# Patient Record
Sex: Female | Born: 1955
Health system: Southern US, Community
[De-identification: ages and names within clinical notes are randomized; demographics above are authoritative.]

## PROBLEM LIST (undated history)

## (undated) DIAGNOSIS — Z87440 Personal history of urinary (tract) infections: Secondary | ICD-10-CM

## (undated) DIAGNOSIS — J189 Pneumonia, unspecified organism: Secondary | ICD-10-CM

## (undated) DIAGNOSIS — H269 Unspecified cataract: Secondary | ICD-10-CM

## (undated) DIAGNOSIS — D693 Immune thrombocytopenic purpura: Secondary | ICD-10-CM

## (undated) DIAGNOSIS — L719 Rosacea, unspecified: Secondary | ICD-10-CM

## (undated) DIAGNOSIS — T7840XA Allergy, unspecified, initial encounter: Secondary | ICD-10-CM

## (undated) DIAGNOSIS — G4713 Recurrent hypersomnia: Secondary | ICD-10-CM

## (undated) DIAGNOSIS — N809 Endometriosis, unspecified: Secondary | ICD-10-CM

## (undated) HISTORY — DX: Rosacea, unspecified: L71.9

## (undated) HISTORY — PX: COLONOSCOPY: SHX174

## (undated) HISTORY — DX: Allergy, unspecified, initial encounter: T78.40XA

## (undated) HISTORY — DX: Pneumonia, unspecified organism: J18.9

## (undated) HISTORY — DX: Personal history of urinary (tract) infections: Z87.440

## (undated) HISTORY — DX: Unspecified cataract: H26.9

## (undated) HISTORY — DX: Recurrent hypersomnia: G47.13

## (undated) HISTORY — DX: Immune thrombocytopenic purpura: D69.3

## (undated) HISTORY — DX: Endometriosis, unspecified: N80.9

---
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
## 2004-06-21 ENCOUNTER — Ambulatory Visit: Payer: Self-pay

## 2004-06-25 HISTORY — PX: OTHER SURGICAL HISTORY: SHX169

## 2004-07-24 ENCOUNTER — Ambulatory Visit: Payer: Self-pay | Admitting: Urology

## 2005-03-26 ENCOUNTER — Ambulatory Visit: Payer: Self-pay | Admitting: Urology

## 2005-05-25 HISTORY — PX: OTHER SURGICAL HISTORY: SHX169

## 2005-06-09 ENCOUNTER — Ambulatory Visit: Payer: Self-pay | Admitting: Obstetrics and Gynecology

## 2005-08-04 ENCOUNTER — Ambulatory Visit: Payer: Self-pay | Admitting: Internal Medicine

## 2005-08-15 ENCOUNTER — Ambulatory Visit: Payer: Self-pay | Admitting: Internal Medicine

## 2005-09-25 HISTORY — PX: VAGINAL HYSTERECTOMY: SUR661

## 2005-09-29 ENCOUNTER — Inpatient Hospital Stay: Payer: Self-pay | Admitting: Obstetrics and Gynecology

## 2005-10-08 ENCOUNTER — Ambulatory Visit: Payer: Self-pay | Admitting: Obstetrics and Gynecology

## 2005-10-27 ENCOUNTER — Ambulatory Visit: Payer: Self-pay | Admitting: Obstetrics and Gynecology

## 2005-11-03 ENCOUNTER — Ambulatory Visit: Payer: Self-pay | Admitting: Obstetrics and Gynecology

## 2005-11-19 ENCOUNTER — Ambulatory Visit: Payer: Self-pay | Admitting: Internal Medicine

## 2005-11-19 ENCOUNTER — Ambulatory Visit: Payer: Self-pay | Admitting: Obstetrics and Gynecology

## 2006-08-12 ENCOUNTER — Ambulatory Visit: Payer: Self-pay | Admitting: General Practice

## 2006-08-25 HISTORY — PX: SHOULDER SURGERY: SHX246

## 2006-12-01 ENCOUNTER — Ambulatory Visit: Payer: Self-pay | Admitting: General Practice

## 2007-01-07 ENCOUNTER — Ambulatory Visit: Payer: Self-pay | Admitting: General Practice

## 2007-01-11 ENCOUNTER — Encounter: Payer: Self-pay | Admitting: General Practice

## 2007-01-24 ENCOUNTER — Encounter: Payer: Self-pay | Admitting: General Practice

## 2007-02-23 ENCOUNTER — Encounter: Payer: Self-pay | Admitting: General Practice

## 2007-03-26 ENCOUNTER — Encounter: Payer: Self-pay | Admitting: General Practice

## 2007-04-26 ENCOUNTER — Encounter: Payer: Self-pay | Admitting: General Practice

## 2007-05-26 ENCOUNTER — Encounter: Payer: Self-pay | Admitting: General Practice

## 2008-10-18 ENCOUNTER — Ambulatory Visit: Payer: Self-pay | Admitting: Internal Medicine

## 2008-10-24 ENCOUNTER — Ambulatory Visit: Payer: Self-pay | Admitting: Internal Medicine

## 2008-12-01 ENCOUNTER — Ambulatory Visit: Payer: Self-pay | Admitting: Internal Medicine

## 2009-01-23 ENCOUNTER — Other Ambulatory Visit: Payer: Self-pay | Admitting: Internal Medicine

## 2009-01-29 ENCOUNTER — Ambulatory Visit: Payer: Self-pay | Admitting: Internal Medicine

## 2009-03-25 ENCOUNTER — Ambulatory Visit: Payer: Self-pay | Admitting: Oncology

## 2009-04-02 ENCOUNTER — Other Ambulatory Visit: Payer: Self-pay | Admitting: Internal Medicine

## 2009-04-05 ENCOUNTER — Ambulatory Visit: Payer: Self-pay | Admitting: Gastroenterology

## 2009-04-11 ENCOUNTER — Ambulatory Visit: Payer: Self-pay | Admitting: Internal Medicine

## 2009-04-20 ENCOUNTER — Ambulatory Visit: Payer: Self-pay | Admitting: General Practice

## 2009-04-25 ENCOUNTER — Ambulatory Visit: Payer: Self-pay | Admitting: Oncology

## 2009-04-25 ENCOUNTER — Ambulatory Visit: Payer: Self-pay | Admitting: Internal Medicine

## 2009-07-11 ENCOUNTER — Ambulatory Visit: Payer: Self-pay | Admitting: Oncology

## 2009-08-03 ENCOUNTER — Ambulatory Visit: Payer: Self-pay | Admitting: Oncology

## 2009-08-25 ENCOUNTER — Ambulatory Visit: Payer: Self-pay | Admitting: Oncology

## 2009-10-23 ENCOUNTER — Ambulatory Visit: Payer: Self-pay | Admitting: Oncology

## 2009-11-14 ENCOUNTER — Ambulatory Visit: Payer: Self-pay | Admitting: Oncology

## 2009-11-23 ENCOUNTER — Ambulatory Visit: Payer: Self-pay | Admitting: Oncology

## 2010-02-19 ENCOUNTER — Other Ambulatory Visit: Payer: Self-pay | Admitting: Internal Medicine

## 2010-03-06 ENCOUNTER — Other Ambulatory Visit: Payer: Self-pay | Admitting: Internal Medicine

## 2010-04-25 ENCOUNTER — Ambulatory Visit: Payer: Self-pay | Admitting: Orthopedic Surgery

## 2010-05-13 ENCOUNTER — Ambulatory Visit: Payer: Self-pay | Admitting: Oncology

## 2010-05-24 ENCOUNTER — Ambulatory Visit: Payer: Self-pay | Admitting: Orthopedic Surgery

## 2010-05-25 ENCOUNTER — Ambulatory Visit: Payer: Self-pay | Admitting: Oncology

## 2010-11-08 ENCOUNTER — Other Ambulatory Visit: Payer: Self-pay | Admitting: Internal Medicine

## 2011-02-25 LAB — HM MAMMOGRAPHY: HM Mammogram: NEGATIVE

## 2011-05-20 ENCOUNTER — Other Ambulatory Visit: Payer: Self-pay | Admitting: Emergency Medicine

## 2012-03-17 IMAGING — CR DG KNEE COMPLETE 4+V*L*
1 series · 5 of 5 positions shown · non-contrast
Comparison: none

REASON FOR EXAM: rule out fracture- inculde sunrise views
COMMENTS:

PROCEDURE:     DXR - DXR KNEE LT COMP WITH OBLIQUES  - April 25, 2010 [DATE]
RESULT:     No fracture, dislocation or other acute bony abnormality is
identified. The knee joint space is well maintained. The patella as
visualized in the lateral and sunrise views shows no significant
abnormalities.

[Series 1: view not recorded · 0.17mm/px · 5 of 5 slices shown]
[im 1/5]
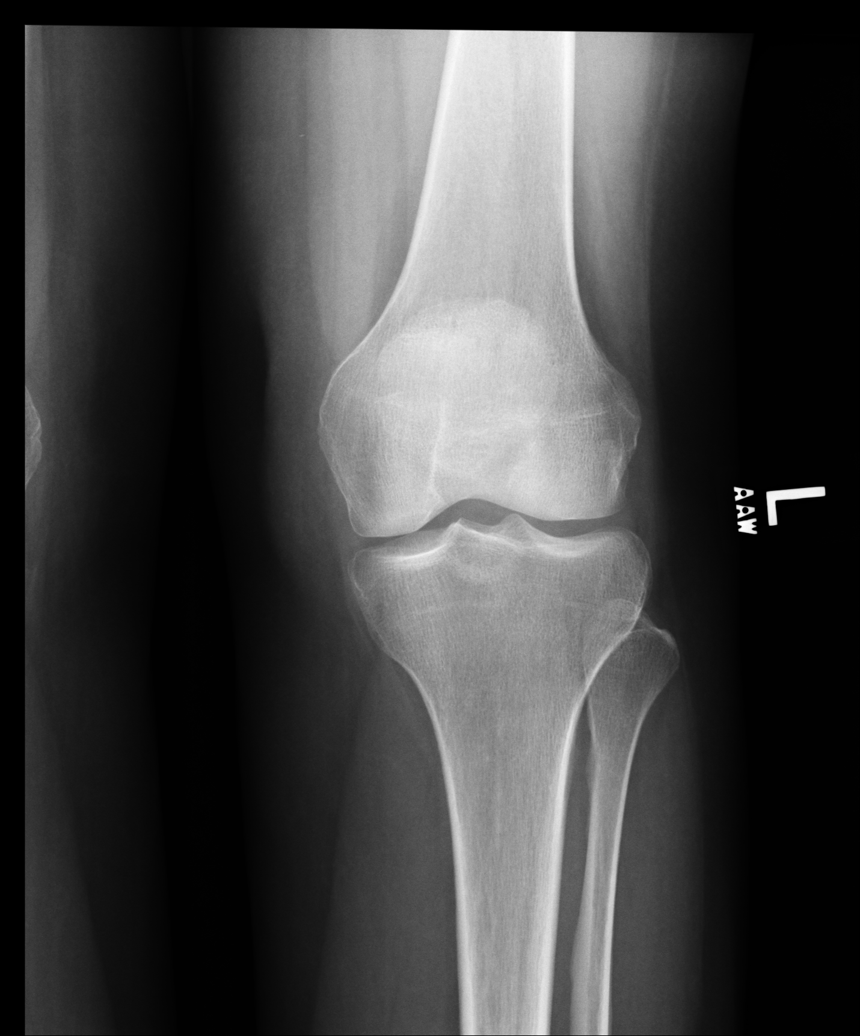
[im 2/5]
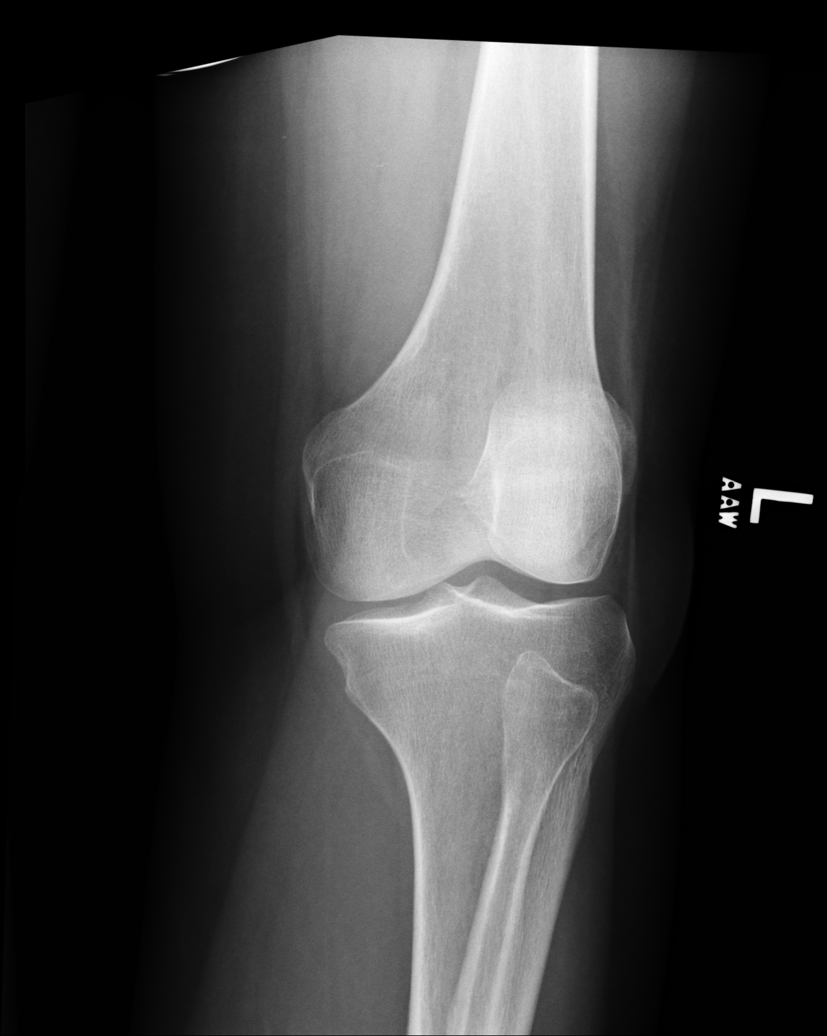
[im 3/5]
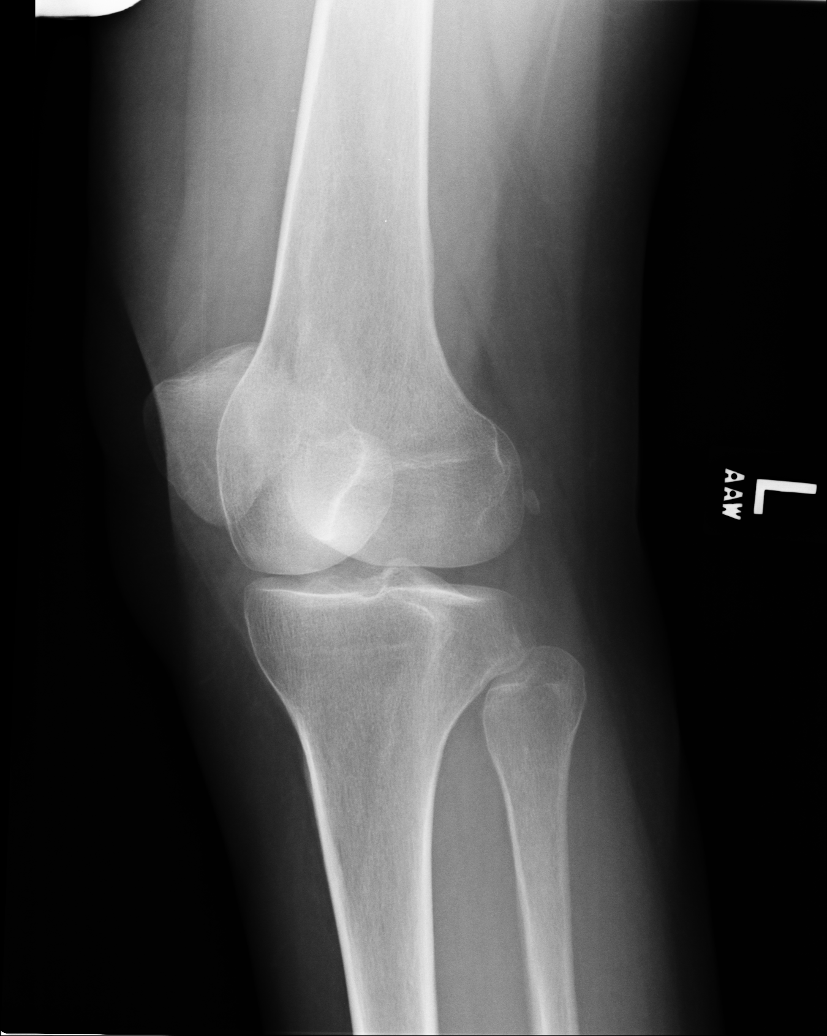
[im 4/5]
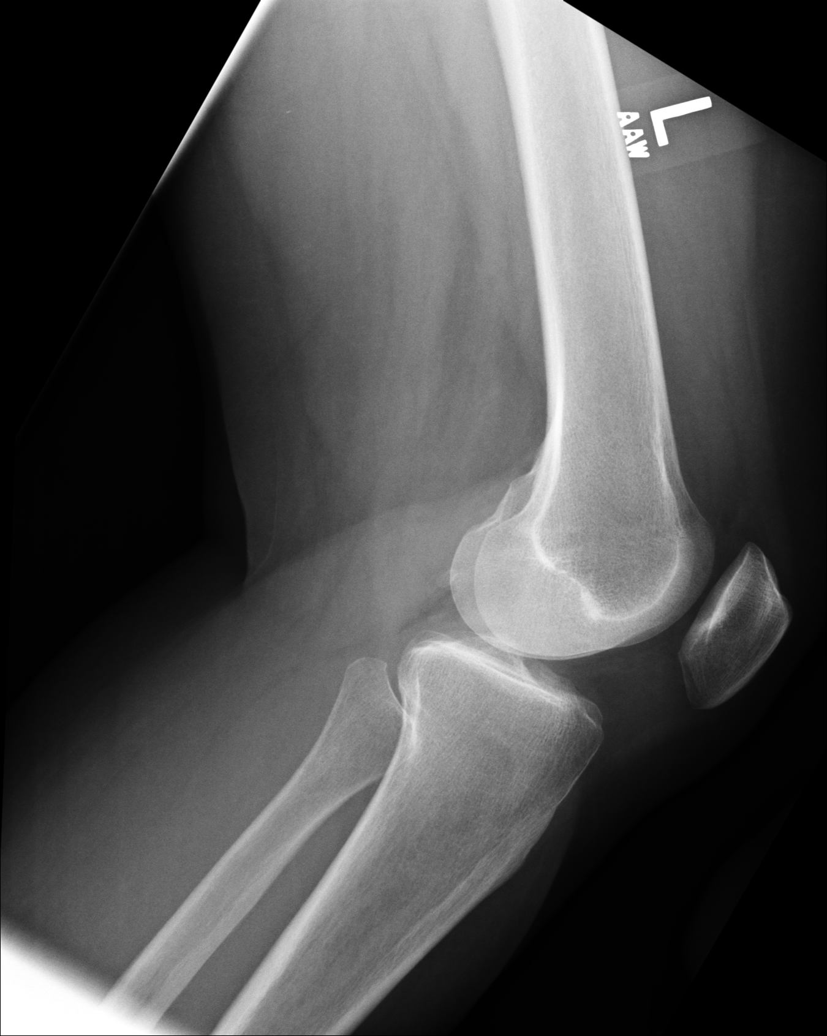
[im 5/5]
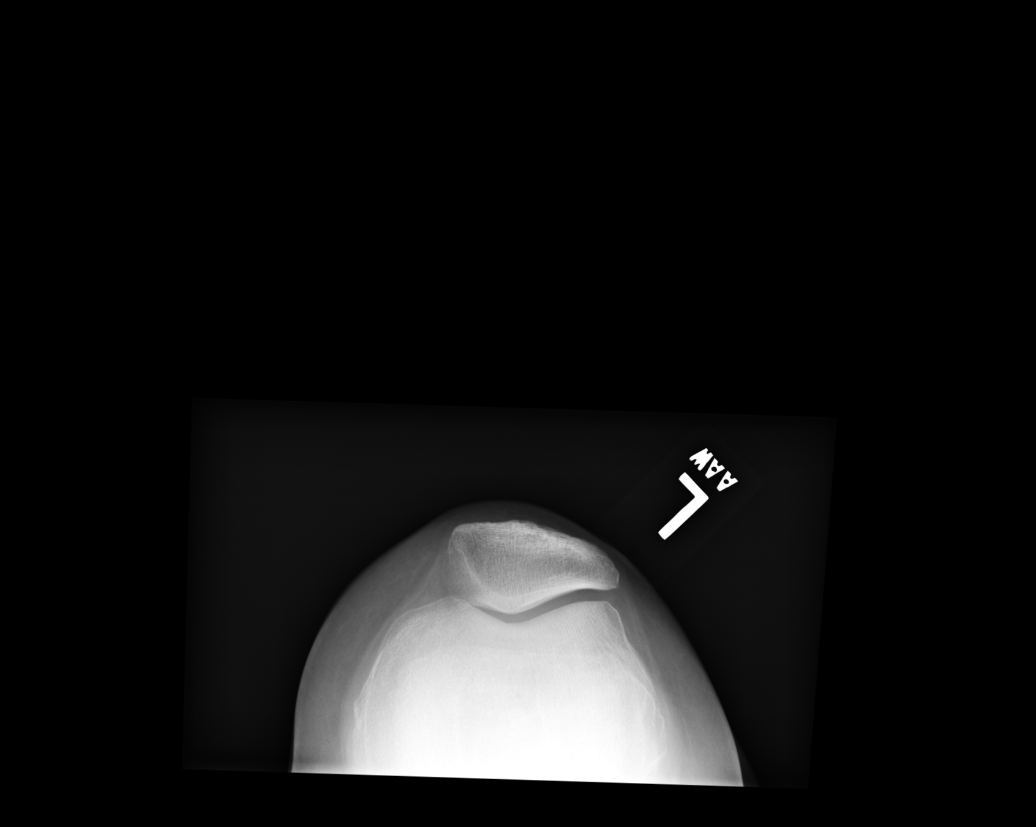

[5 of 5 positions shown; findings below may reference images not displayed]

IMPRESSION: No acute changes are identified.

## 2012-04-22 ENCOUNTER — Ambulatory Visit: Payer: Self-pay | Admitting: Physician Assistant

## 2013-04-20 ENCOUNTER — Ambulatory Visit: Payer: Self-pay | Admitting: Family Medicine

## 2013-07-05 ENCOUNTER — Encounter: Payer: Self-pay | Admitting: *Deleted

## 2013-07-06 ENCOUNTER — Ambulatory Visit (INDEPENDENT_AMBULATORY_CARE_PROVIDER_SITE_OTHER): Payer: Self-pay | Admitting: *Deleted

## 2013-07-06 DIAGNOSIS — I781 Nevus, non-neoplastic: Secondary | ICD-10-CM

## 2013-07-06 NOTE — Progress Notes (Signed)
X=.3% Sotradecol administered with a 27g butterfly.  Patient received a total of 4cc.  Pt was concerned over some very small spider veins that have been treated previously at another practice. Was able to inject them. If she needs further treatment, cutaneous laser would be a good option. Will follow prn. Tol well  Photos: yes  Compression stockings applied: yes

## 2013-07-07 ENCOUNTER — Encounter: Payer: Self-pay | Admitting: Vascular Surgery

## 2013-09-19 ENCOUNTER — Ambulatory Visit (INDEPENDENT_AMBULATORY_CARE_PROVIDER_SITE_OTHER): Payer: 59 | Admitting: Internal Medicine

## 2013-09-19 ENCOUNTER — Encounter: Payer: Self-pay | Admitting: Internal Medicine

## 2013-09-19 VITALS — BP 110/70 | HR 59 | Temp 98.0°F | Ht 66.5 in | Wt 144.2 lb

## 2013-09-19 DIAGNOSIS — N951 Menopausal and female climacteric states: Secondary | ICD-10-CM

## 2013-09-19 DIAGNOSIS — D696 Thrombocytopenia, unspecified: Secondary | ICD-10-CM

## 2013-09-19 DIAGNOSIS — Z1211 Encounter for screening for malignant neoplasm of colon: Secondary | ICD-10-CM

## 2013-09-19 DIAGNOSIS — G4700 Insomnia, unspecified: Secondary | ICD-10-CM

## 2013-09-19 DIAGNOSIS — D649 Anemia, unspecified: Secondary | ICD-10-CM

## 2013-09-19 DIAGNOSIS — M2550 Pain in unspecified joint: Secondary | ICD-10-CM

## 2013-09-19 DIAGNOSIS — Z13220 Encounter for screening for lipoid disorders: Secondary | ICD-10-CM

## 2013-09-19 MED ORDER — TRAZODONE HCL 50 MG PO TABS: 50.0000 mg | ORAL_TABLET | Freq: Every day | ORAL | Status: DC

## 2013-09-19 NOTE — Progress Notes (Signed)
Pre-visit discussion using our clinic review tool. No additional management support is needed unless otherwise documented below in the visit note.  

## 2013-09-20 ENCOUNTER — Encounter: Payer: Self-pay | Admitting: Internal Medicine

## 2013-09-20 DIAGNOSIS — D696 Thrombocytopenia, unspecified: Secondary | ICD-10-CM | POA: Insufficient documentation

## 2013-09-20 DIAGNOSIS — N951 Menopausal and female climacteric states: Secondary | ICD-10-CM | POA: Insufficient documentation

## 2013-09-20 DIAGNOSIS — D649 Anemia, unspecified: Secondary | ICD-10-CM | POA: Insufficient documentation

## 2013-09-20 DIAGNOSIS — M2550 Pain in unspecified joint: Secondary | ICD-10-CM | POA: Insufficient documentation

## 2013-09-20 DIAGNOSIS — G4700 Insomnia, unspecified: Secondary | ICD-10-CM | POA: Insufficient documentation

## 2013-09-20 NOTE — Assessment & Plan Note (Signed)
Previous anemia.  Recheck cbc.

## 2013-09-20 NOTE — Assessment & Plan Note (Signed)
Joint stiffness as outlined.  Involves her hands.   Mostly bothers her in the am.  Check ESR, ANA, RF and CRP.  Further w/up pending results.

## 2013-09-20 NOTE — Assessment & Plan Note (Signed)
Sleeping better on trazodone.  Taper the celexa as outlined.  Hopefully can taper off.

## 2013-09-20 NOTE — Assessment & Plan Note (Addendum)
Felt to have ITP.  Overdue f/u cbc.  Check cbc.  Has not followed up with hematology.

## 2013-09-20 NOTE — Progress Notes (Signed)
   Subjective:    Patient ID: Kimberly Figueroa, female    DOB: 03/29/1956, 58 y.o.   MRN: [id number]  HPI 58 year old female with past history of endometriosis s/p hysterectomy and thrombocytopenia (felt to be ITP).  She comes in today to follow up on these issues as well as for a complete physical exam.  Also here to transfer her care to [facility].  States she is doing well.  Job is going well.  Staying active.  No cardiac symptoms with increased activity or exertion.  No sob.  No cough or congestion.  No nausea or vomiting.  No bowel change.  Overall she feels good.  Sleeping better with trazodone.  On citalopram.  This has helped her hot flashes.  Felt the effexor made her too fatigued.  Tolerating the celexa better.     Past Medical History  Diagnosis Date  . Endometriosis   . History of frequent urinary tract infections     s/p bladder dilatation (11/05)  . ITP (idiopathic thrombocytopenic purpura)     Outpatient Encounter Prescriptions as of 09/19/2013  Medication Sig  . citalopram (CELEXA) 10 MG tablet Take 20 mg by mouth daily.  . Multiple Vitamins-Minerals (MULTIVITAMIN GUMMIES ADULTS PO) Take by mouth. VitaCraves  . traZODone (DESYREL) 50 MG tablet Take 1 tablet (50 mg total) by mouth at bedtime.  . [DISCONTINUED] traZODone (DESYREL) 50 MG tablet Take 50 mg by mouth at bedtime.    Review of Systems Patient denies any headache, lightheadedness or dizziness. No sinus or allergy symptoms.   No chest pain, tightness or palpatations.  No increased shortness of breath, cough or congestion.  No nausea or vomiting.  No acid reflux.  No abdominal pain or cramping.  No bowel change, such as diarrhea, constipation, BRBPR or melana.  No urine change.  hot flashes controlled.  Sleeping better.  Reports joint pain in her hands.  Bothers her in the am.  Better with movement.       Objective:   Physical Exam  Filed Vitals:   09/19/13 1537  BP: 110/70  Pulse: 59  Temp: 98 F (30.5 C)    58 year old female in no acute distress.   HEENT:  Nares- clear.  Oropharynx - without lesions. NECK:  Supple.  Nontender.  No audible bruit.  HEART:  Appears to be regular. LUNGS:  No crackles or wheezing audible.  Respirations even and unlabored.  RADIAL PULSE:  Equal bilaterally.    BREASTS:  No nipple discharge or nipple retraction present.  Could not appreciate any distinct nodules or axillary adenopathy.  ABDOMEN:  Soft, nontender.  Bowel sounds present and normal.  No audible abdominal bruit.  GU:  Not performed.  S/p hysterectomy.    EXTREMITIES:  No increased edema present.  DP pulses palpable and equal bilaterally.          Assessment & Plan:  HEALTH MAINTENANCE.  Physical today.  Schedule mammogram.  Overdue.  Colonoscopy 04/05/09 revealed tortuous colon otherwise normal.  Recommended f/u colonoscopy in 10 years (per pt).  IFOB given.   I spent 25 minutes with the patient and more than 50% of the time was spent in consultation regarding the above.

## 2013-09-20 NOTE — Assessment & Plan Note (Signed)
Doing well on celexa.  Hot flashes controlled.  Discussed with her today regarding the possible interaction with celexa and trazodone.  Will try to taper down the celexa and taper off.  Follow.

## 2013-09-26 ENCOUNTER — Other Ambulatory Visit (INDEPENDENT_AMBULATORY_CARE_PROVIDER_SITE_OTHER): Payer: 59

## 2013-09-26 DIAGNOSIS — Z13220 Encounter for screening for lipoid disorders: Secondary | ICD-10-CM

## 2013-09-26 DIAGNOSIS — D696 Thrombocytopenia, unspecified: Secondary | ICD-10-CM

## 2013-09-26 DIAGNOSIS — M2550 Pain in unspecified joint: Secondary | ICD-10-CM

## 2013-09-26 LAB — COMPREHENSIVE METABOLIC PANEL
ALT: 17 U/L (ref 0–35)
AST: 22 U/L (ref 0–37)
Albumin: 4 g/dL (ref 3.5–5.2)
Alkaline Phosphatase: 74 U/L (ref 39–117)
BILIRUBIN TOTAL: 0.7 mg/dL (ref 0.3–1.2)
BUN: 14 mg/dL (ref 6–23)
CO2: 30 mEq/L (ref 19–32)
CREATININE: 0.6 mg/dL (ref 0.4–1.2)
Calcium: 9 mg/dL (ref 8.4–10.5)
Chloride: 106 mEq/L (ref 96–112)
GFR: 102.18 mL/min (ref >60.00)
GLUCOSE: 90 mg/dL (ref 70–99)
Potassium: 4.1 mEq/L (ref 3.5–5.1)
Sodium: 142 mEq/L (ref 135–145)
Total Protein: 6.3 g/dL (ref 6.0–8.3)

## 2013-09-26 LAB — CBC WITH DIFFERENTIAL/PLATELET
Basophils Absolute: 0 10*3/uL (ref 0.0–0.1)
Basophils Relative: 0.8 % (ref 0.0–3.0)
EOS ABS: 0.1 10*3/uL (ref 0.0–0.7)
Eosinophils Relative: 2.9 % (ref 0.0–5.0)
HEMATOCRIT: 39.5 % (ref 36.0–46.0)
HEMOGLOBIN: 12.7 g/dL (ref 12.0–15.0)
Lymphocytes Relative: 31.7 % (ref 12.0–46.0)
Lymphs Abs: 1.2 10*3/uL (ref 0.7–4.0)
MCHC: 32 g/dL (ref 30.0–36.0)
MCV: 94.8 fl (ref 78.0–100.0)
MONO ABS: 0.2 10*3/uL (ref 0.1–1.0)
Monocytes Relative: 6.7 % (ref 3.0–12.0)
NEUTROS ABS: 2.1 10*3/uL (ref 1.4–7.7)
Neutrophils Relative %: 57.9 % (ref 43.0–77.0)
Platelets: 139 10*3/uL — ABNORMAL LOW (ref 150.0–400.0)
RBC: 4.17 Mil/uL (ref 3.87–5.11)
RDW: 14.5 % (ref 11.5–14.6)
WBC: 3.7 10*3/uL — AB (ref 4.5–10.5)

## 2013-09-26 LAB — LIPID PANEL
CHOLESTEROL: 160 mg/dL (ref 0–200)
HDL: 68.5 mg/dL (ref >39.00)
LDL Cholesterol: 85 mg/dL (ref 0–99)
TRIGLYCERIDES: 35 mg/dL (ref 0.0–149.0)
Total CHOL/HDL Ratio: 2
VLDL: 7 mg/dL (ref 0.0–40.0)

## 2013-09-26 LAB — C-REACTIVE PROTEIN

## 2013-09-26 LAB — TSH: TSH: 1.78 u[IU]/mL (ref 0.35–5.50)

## 2013-09-26 LAB — RHEUMATOID FACTOR

## 2013-09-26 LAB — SEDIMENTATION RATE: SED RATE: 11 mm/h (ref 0–22)

## 2013-09-27 LAB — ANA: ANA: NEGATIVE

## 2013-09-28 ENCOUNTER — Other Ambulatory Visit: Payer: Self-pay | Admitting: Internal Medicine

## 2013-09-28 DIAGNOSIS — D693 Immune thrombocytopenic purpura: Secondary | ICD-10-CM

## 2013-09-28 NOTE — Progress Notes (Signed)
Referral to hematology made

## 2013-09-29 ENCOUNTER — Ambulatory Visit: Payer: Self-pay | Admitting: Oncology

## 2013-09-29 ENCOUNTER — Telehealth: Payer: Self-pay | Admitting: Internal Medicine

## 2013-09-29 NOTE — Telephone Encounter (Signed)
If she was on one every other day and increased to two per day - see if she can decrease to two alternating with one qod.  This will not be as big of a decrease.

## 2013-09-29 NOTE — Telephone Encounter (Signed)
Closed in error-see below 

## 2013-09-29 NOTE — Telephone Encounter (Signed)
The patient wanted to inform the physician that she has gone back to taking 2 citalopram (CELEXA) 10 MG tablet every day instead of 1 every other day for her hot flashes.

## 2013-09-30 ENCOUNTER — Encounter: Payer: Self-pay | Admitting: Internal Medicine

## 2013-09-30 ENCOUNTER — Encounter: Payer: Self-pay | Admitting: *Deleted

## 2013-09-30 ENCOUNTER — Other Ambulatory Visit (INDEPENDENT_AMBULATORY_CARE_PROVIDER_SITE_OTHER): Payer: 59

## 2013-09-30 DIAGNOSIS — R195 Other fecal abnormalities: Secondary | ICD-10-CM

## 2013-09-30 DIAGNOSIS — Z1211 Encounter for screening for malignant neoplasm of colon: Secondary | ICD-10-CM

## 2013-09-30 LAB — FECAL OCCULT BLOOD, IMMUNOCHEMICAL: FECAL OCCULT BLD: POSITIVE — AB

## 2013-09-30 NOTE — Telephone Encounter (Signed)
Sent mychart message

## 2013-10-04 ENCOUNTER — Encounter: Payer: Self-pay | Admitting: Emergency Medicine

## 2013-10-04 ENCOUNTER — Encounter: Payer: Self-pay | Admitting: *Deleted

## 2013-10-04 NOTE — Telephone Encounter (Signed)
Order placed for GI referral.   

## 2013-10-07 ENCOUNTER — Encounter: Payer: Self-pay | Admitting: Internal Medicine

## 2013-10-11 ENCOUNTER — Ambulatory Visit: Payer: Self-pay | Admitting: General Practice

## 2013-10-11 LAB — FOLATE: Folic Acid: 73.5 ng/mL (ref 3.1–100.0)

## 2013-10-13 ENCOUNTER — Encounter: Payer: Self-pay | Admitting: Emergency Medicine

## 2013-10-13 ENCOUNTER — Telehealth: Payer: Self-pay | Admitting: Internal Medicine

## 2013-10-13 NOTE — Telephone Encounter (Signed)
Pt notified we received GI records.

## 2013-10-17 ENCOUNTER — Encounter: Payer: Self-pay | Admitting: Internal Medicine

## 2013-10-17 LAB — CBC CANCER CENTER
BASOS PCT: 1 %
Basophil #: 0 x10 3/mm (ref 0.0–0.1)
EOS PCT: 2.8 %
Eosinophil #: 0.1 x10 3/mm (ref 0.0–0.7)
HCT: 38.3 % (ref 35.0–47.0)
HGB: 12.1 g/dL (ref 12.0–16.0)
LYMPHS ABS: 1.5 x10 3/mm (ref 1.0–3.6)
Lymphocyte %: 33.2 %
MCH: 29.4 pg (ref 26.0–34.0)
MCHC: 31.6 g/dL — ABNORMAL LOW (ref 32.0–36.0)
MCV: 93 fL (ref 80–100)
MONO ABS: 0.2 x10 3/mm (ref 0.2–0.9)
MONOS PCT: 5.4 %
Neutrophil #: 2.6 x10 3/mm (ref 1.4–6.5)
Neutrophil %: 57.6 %
PLATELETS: 135 x10 3/mm — AB (ref 150–440)
RBC: 4.12 10*6/uL (ref 3.80–5.20)
RDW: 13.9 % (ref 11.5–14.5)
WBC: 4.5 x10 3/mm (ref 3.6–11.0)

## 2013-10-17 LAB — LACTATE DEHYDROGENASE: LDH: 205 U/L (ref 81–246)

## 2013-10-23 ENCOUNTER — Ambulatory Visit: Payer: Self-pay | Admitting: Oncology

## 2013-10-24 ENCOUNTER — Encounter: Payer: Self-pay | Admitting: *Deleted

## 2013-12-13 ENCOUNTER — Encounter: Payer: Self-pay | Admitting: Internal Medicine

## 2013-12-13 ENCOUNTER — Ambulatory Visit (INDEPENDENT_AMBULATORY_CARE_PROVIDER_SITE_OTHER): Payer: 59 | Admitting: Internal Medicine

## 2013-12-13 VITALS — BP 92/60 | HR 60 | Ht 66.5 in | Wt 145.8 lb

## 2013-12-13 DIAGNOSIS — R195 Other fecal abnormalities: Secondary | ICD-10-CM

## 2013-12-13 MED ORDER — NA SULFATE-K SULFATE-MG SULF 17.5-3.13-1.6 GM/177ML PO SOLN: ORAL | Status: DC

## 2013-12-13 NOTE — Patient Instructions (Addendum)
You have been scheduled for a colonoscopy with propofol. Please follow written instructions given to you at your visit today.  Please pick up your prep kit at the pharmacy within the next 1-3 days. If you use inhalers (even only as needed), please bring them with you on the day of your procedure. Your physician has requested that you go to www.startemmi.com and enter the access code given to you at your visit today. This web site gives a general overview about your procedure. However, you should still follow specific instructions given to you by our office regarding your preparation for the procedure.  CC: Dr Einar Pheasant

## 2013-12-13 NOTE — Progress Notes (Signed)
Kimberly Figueroa 1956-07-08 [id number]  Note: This dictation was prepared with Dragon digital system. Any transcriptional errors that result from this procedure are unintentional.   History of Present Illness:  58 year old white female who is asymptomatic as far as GI symptoms are concerned. She was found to be Hemoccult-positive on home test. She had a screening colonoscopy at age 14 in [place] which showed tortuous sigmoid colon but no polyps. She has a strong family history of Crohn's disease in her father and paternal grandfather as well as the great uncle and great amount. She denies having any diarrhea but has occasional rectal bleeding which she attributes to hemorrhoids. She has ITP, platelet count over 100,000   Past Medical History  Diagnosis Date  . Endometriosis   . History of frequent urinary tract infections     s/p bladder dilatation (11/05)  . ITP (idiopathic thrombocytopenic purpura)   . Pneumonia   . Anemia     Past Surgical History  Procedure Laterality Date  . Tonsillectomy  1961  . Cesarean section  1993  . Vaginal delivery  1987  . Bladder dilatation  11/05  . Laproscopic surgery  10/06    found to have endometriosis  . Vaginal hysterectomy  02/07  . Shoulder surgery Left 2008    Allergies  Allergen Reactions  . Darvon [Propoxyphene] Nausea Only  . Vicodin [Hydrocodone-Acetaminophen] Itching    Family history and social history have been reviewed.  Review of Systems: Denies heartburn dysphagia abdominal pain. Having normal bowel habits  The remainder of the 10 point ROS is negative except as outlined in the H&P  Physical Exam: General Appearance Well developed, in no distress Eyes  Non icteric  HEENT  Non traumatic, normocephalic  Mouth No lesion, tongue papillated, no cheilosis Neck Supple without adenopathy, thyroid not enlarged, no carotid bruits, no JVD Lungs Clear to auscultation bilaterally COR Normal S1, normal S2, regular rhythm,  no murmur, quiet precordium Abdomen soft nontender with normoactive bowel sounds. No distention Rectal no external hemorrhoids. Normal rectal sphincter tone. Small amount of brown Hemoccult negative stool Extremities  No pedal edema Skin No lesions Neurological Alert and oriented x 3 Psychological Normal mood and affect  Assessment and Plan:    58 year old white female with the home stool Hemoccults test positive for blood. With a normal colonoscopy 5 years ago. I was unable to reproduce the heme positive stool on my exam today. There is a strong family history of Crohn's disease in several relatives. Her hemoglobin was 12.7 hematocrit 39.5 platelet count 139,000. We have discussed  colonoscopy and she agrees to proceed with the diagnostic colonoscopy. We have discussed the prep as well as a sedation    Lafayette Dragon 12/13/2013

## 2013-12-20 ENCOUNTER — Encounter: Payer: Self-pay | Admitting: *Deleted

## 2013-12-21 ENCOUNTER — Ambulatory Visit (INDEPENDENT_AMBULATORY_CARE_PROVIDER_SITE_OTHER): Payer: 59 | Admitting: *Deleted

## 2013-12-21 DIAGNOSIS — I781 Nevus, non-neoplastic: Secondary | ICD-10-CM

## 2013-12-21 NOTE — Progress Notes (Signed)
   Cutaneous Laser:pulsed mode  810 j/cm2 424ms delay 76ms duration 0.5 spot  Total pulses: 2387 Total energy 3.787  Total time::30  Photos: not new onew  Compression stockings applied: NA  Patient was not happy with sclero results so tried CL on the tiny vessels that bother her. Hoping for results she is happy with.

## 2013-12-22 ENCOUNTER — Encounter: Payer: Self-pay | Admitting: *Deleted

## 2013-12-22 LAB — HM MAMMOGRAPHY

## 2013-12-28 ENCOUNTER — Encounter: Payer: Self-pay | Admitting: Internal Medicine

## 2014-01-02 ENCOUNTER — Ambulatory Visit: Payer: Self-pay

## 2014-01-02 ENCOUNTER — Other Ambulatory Visit: Payer: Self-pay | Admitting: Occupational Medicine

## 2014-01-02 DIAGNOSIS — M25532 Pain in left wrist: Secondary | ICD-10-CM

## 2014-01-17 ENCOUNTER — Telehealth: Payer: Self-pay | Admitting: *Deleted

## 2014-01-17 NOTE — Telephone Encounter (Signed)
On which medication? (Please specify)

## 2014-01-17 NOTE — Telephone Encounter (Signed)
Pharmacy Note:  Pt is asking for a 90 day supply

## 2014-01-17 NOTE — Telephone Encounter (Signed)
Citalopram 10 mg

## 2014-01-18 MED ORDER — CITALOPRAM HYDROBROMIDE 10 MG PO TABS: 20.0000 mg | ORAL_TABLET | Freq: Every day | ORAL | Status: DC

## 2014-01-18 NOTE — Telephone Encounter (Signed)
90 day supply sent to pharmacy

## 2014-01-18 NOTE — Addendum Note (Signed)
Addended by: Wynonia Lawman E on: 01/18/2014 09:29 AM   Modules accepted: Orders

## 2014-02-08 ENCOUNTER — Ambulatory Visit (AMBULATORY_SURGERY_CENTER): Payer: 59 | Admitting: Internal Medicine

## 2014-02-08 ENCOUNTER — Encounter: Payer: Self-pay | Admitting: Internal Medicine

## 2014-02-08 VITALS — BP 117/73 | HR 58 | Temp 96.9°F | Resp 26 | Ht 66.0 in | Wt 145.0 lb

## 2014-02-08 DIAGNOSIS — R195 Other fecal abnormalities: Secondary | ICD-10-CM

## 2014-02-08 MED ORDER — SODIUM CHLORIDE 0.9 % IV SOLN: 500.0000 mL | INTRAVENOUS | Status: DC

## 2014-02-08 NOTE — Progress Notes (Signed)
Pt. Very sleepy through out post procedure.  Vital signs are stable.  Occasionally has awakened very briefly and gone back to sleep. 1624.

## 2014-02-08 NOTE — Progress Notes (Signed)
1637, pt. Up to bathroom with assistance. Drowsy but answers questions appropriately.  Discharged to home via wheelchair with spouse.

## 2014-02-08 NOTE — Op Note (Signed)
[facility]  [address] [place], [id number]   COLONOSCOPY PROCEDURE REPORT  PATIENT: Kimberly Figueroa, Kimberly Figueroa  MR#: [id number] BIRTHDATE: October 18, 1955 , 58  yrs. old GENDER: Female ENDOSCOPIST: Lafayette Dragon, MD REFERRED BY: Dr Einar Pheasant PROCEDURE DATE:  02/08/2014 PROCEDURE:   Colonoscopy, screening First Screening Colonoscopy - Avg.  risk and is 50 yrs.  old or older - No.  Prior Negative Screening - Now for repeat screening. 10 or more years since last screening  History of Adenoma - Now for follow-up colonoscopy & has been > or = to 3 yrs.  N/A  Polyps Removed Today? No.  Recommend repeat exam, <10 yrs? No. ASA CLASS:   Class II INDICATIONS:llast colonoscopy at age 58 in [place], positive family history of Crohn's disease in father.  Maternal grandfather. Uncle and aunt.  Heme positive stool. MEDICATIONS: MAC sedation, administered by CRNA and propofol (Diprivan) 500mg  IV  DESCRIPTION OF PROCEDURE:   After the risks benefits and alternatives of the procedure were thoroughly explained, informed consent was obtained.  A digital rectal exam revealed no abnormalities of the rectum.   The LB [id number]  endoscope was introduced through the anus and advanced to the cecum, which was identified by both the appendix and ileocecal valve. No adverse events experienced.   The quality of the prep was good, using MoviPrep  The instrument was then slowly withdrawn as the colon was fully examined.      COLON FINDINGS: A normal appearing cecum, ileocecal valve, and appendiceal orifice were identified.  The ascending, hepatic flexure, transverse, splenic flexure, descending, sigmoid colon and rectum appeared unremarkable. I was unable to enter terminal ileum No polyps or cancers were seen.  Retroflexed views revealed no abnormalities. The time to cecum=6 minutes 14 seconds.  Withdrawal time=11 minutes 16 seconds.  The scope was withdrawn and the procedure  completed. COMPLICATIONS: There were no complications.  ENDOSCOPIC IMPRESSION: Normal colon to the cecum  RECOMMENDATIONS: high fiber diet Repeat stool Hemoccults, if positive we will consider upper endoscopy and/or small bowel capsule endoscopy For recall colonoscopy in 10 years   eSigned:  Lafayette Dragon, MD 02/08/2014 3:18 PM   cc:

## 2014-02-08 NOTE — Patient Instructions (Addendum)
YOU HAD AN ENDOSCOPIC PROCEDURE TODAY AT THE [facility]: Refer to the procedure report that was given to you for any specific questions about what was found during the examination.  If the procedure report does not answer your questions, please call your gastroenterologist to clarify.  If you requested that your care partner not be given the details of your procedure findings, then the procedure report has been included in a sealed envelope for you to review at your convenience later.  YOU SHOULD EXPECT: Some feelings of bloating in the abdomen. Passage of more gas than usual.  Walking can help get rid of the air that was put into your GI tract during the procedure and reduce the bloating. If you had a lower endoscopy (such as a colonoscopy or flexible sigmoidoscopy) you may notice spotting of blood in your stool or on the toilet paper. If you underwent a bowel prep for your procedure, then you may not have a normal bowel movement for a few days.  DIET: Your first meal following the procedure should be a light meal and then it is ok to progress to your normal diet.  A half-sandwich or bowl of soup is an example of a good first meal.  Heavy or fried foods are harder to digest and may make you feel nauseous or bloated.  Likewise meals heavy in dairy and vegetables can cause extra gas to form and this can also increase the bloating.  Drink plenty of fluids but you should avoid alcoholic beverages for 24 hours.  ACTIVITY: Your care partner should take you home directly after the procedure.  You should plan to take it easy, moving slowly for the rest of the day.  You can resume normal activity the day after the procedure however you should NOT DRIVE or use heavy machinery for 24 hours (because of the sedation medicines used during the test).    SYMPTOMS TO REPORT IMMEDIATELY: A gastroenterologist can be reached at any hour.  During normal business hours, 8:30 AM to 5:00 PM Monday through Friday,  call [phone].  After hours and on weekends, please call the GI answering service at [phone] who will take a message and have the physician on call contact you.   Following lower endoscopy (colonoscopy or flexible sigmoidoscopy):  Excessive amounts of blood in the stool  Significant tenderness or worsening of abdominal pains  Swelling of the abdomen that is new, acute  Fever of 100F or higher    FOLLOW UP: If any biopsies were taken you will be contacted by phone or by letter within the next 1-3 weeks.  Call your gastroenterologist if you have not heard about the biopsies in 3 weeks.  Our staff will call the home number listed on your records the next business day following your procedure to check on you and address any questions or concerns that you may have at that time regarding the information given to you following your procedure. This is a courtesy call and so if there is no answer at the home number and we have not heard from you through the emergency physician on call, we will assume that you have returned to your regular daily activities without incident.  SIGNATURES/CONFIDENTIALITY: You and/or your care partner have signed paperwork which will be entered into your electronic medical record.  These signatures attest to the fact that that the information above on your After Visit Summary has been reviewed and is understood.  Full responsibility of the confidentiality   of this discharge information lies with you and/or your care-partner.   Normal colonoscopy!  Repeat in 10 years. 2025.  High fiber diet information given.  Stool specimen card given to pt. With return address to [facility].

## 2014-02-08 NOTE — Progress Notes (Signed)
Report to PACU, RN, vss, BBS= Clear.  

## 2014-02-09 ENCOUNTER — Other Ambulatory Visit: Payer: Self-pay | Admitting: *Deleted

## 2014-02-09 ENCOUNTER — Telehealth: Payer: Self-pay | Admitting: *Deleted

## 2014-02-09 DIAGNOSIS — R195 Other fecal abnormalities: Secondary | ICD-10-CM

## 2014-02-09 NOTE — Telephone Encounter (Signed)
  Follow up Call-  Call back number 02/08/2014  Post procedure Call Back phone  # [phone]  Permission to leave phone message Yes     Patient questions:  Do you have a fever, pain , or abdominal swelling? no Pain Score  0 *  Have you tolerated food without any problems? yes  Have you been able to return to your normal activities? yes  Do you have any questions about your discharge instructions: Diet   no Medications  no Follow up visit  no  Do you have questions or concerns about your Care? no  Actions: * If pain score is 4 or above: No action needed, pain <4.

## 2014-02-13 ENCOUNTER — Encounter: Payer: Self-pay | Admitting: Internal Medicine

## 2014-02-13 DIAGNOSIS — D638 Anemia in other chronic diseases classified elsewhere: Secondary | ICD-10-CM

## 2014-03-01 ENCOUNTER — Encounter: Payer: Self-pay | Admitting: Internal Medicine

## 2014-03-21 ENCOUNTER — Encounter: Payer: Self-pay | Admitting: Internal Medicine

## 2014-03-21 ENCOUNTER — Ambulatory Visit (INDEPENDENT_AMBULATORY_CARE_PROVIDER_SITE_OTHER): Payer: 59 | Admitting: Internal Medicine

## 2014-03-21 VITALS — BP 98/60 | HR 64 | Temp 98.9°F | Ht 66.0 in | Wt 147.8 lb

## 2014-03-21 DIAGNOSIS — G4700 Insomnia, unspecified: Secondary | ICD-10-CM

## 2014-03-21 DIAGNOSIS — D696 Thrombocytopenia, unspecified: Secondary | ICD-10-CM

## 2014-03-21 DIAGNOSIS — N951 Menopausal and female climacteric states: Secondary | ICD-10-CM

## 2014-03-21 MED ORDER — CLONAZEPAM 0.5 MG PO TABS: ORAL_TABLET | ORAL | Status: DC

## 2014-03-21 MED ORDER — CITALOPRAM HYDROBROMIDE 10 MG PO TABS: ORAL_TABLET | ORAL | Status: DC

## 2014-03-21 NOTE — Progress Notes (Signed)
Pre visit review using our clinic review tool, if applicable. No additional management support is needed unless otherwise documented below in the visit note. 

## 2014-03-26 ENCOUNTER — Encounter: Payer: Self-pay | Admitting: Internal Medicine

## 2014-03-26 NOTE — Progress Notes (Signed)
   Subjective:    Patient ID: Kimberly Figueroa, female    DOB: 31-Aug-1955, 58 y.o.   MRN: [id number]  HPI 58 year old female with past history of endometriosis s/p hysterectomy and thrombocytopenia (felt to be ITP).  She comes in today for a scheduled follow up.  States she is doing relatively well.  Job is going well.  Staying active.  No cardiac symptoms with increased activity or exertion.  No sob.  No cough or congestion.  No nausea or vomiting.  No bowel change.  Overall she feels good.  On citalopram.  This has helped her hot flashes.  She has noticed more hot flashes lately.  Wanted to increase the dose of citalopram.  She is taking trazodone.  We discussed the possible interaction between citalopram and trazodone.  Felt effexor made her too fatigued.     Past Medical History  Diagnosis Date  . Endometriosis   . History of frequent urinary tract infections     s/p bladder dilatation (11/05)  . ITP (idiopathic thrombocytopenic purpura)   . Pneumonia   . Anemia     Outpatient Encounter Prescriptions as of 03/21/2014  Medication Sig  . citalopram (CELEXA) 10 MG tablet Take 3 tablets per day  . Multiple Vitamins-Minerals (MULTIVITAMIN GUMMIES ADULTS PO) Take by mouth. VitaCraves  . [DISCONTINUED] citalopram (CELEXA) 10 MG tablet Take 2 tablets (20 mg total) by mouth daily.  . [DISCONTINUED] traZODone (DESYREL) 50 MG tablet Take 1 tablet (50 mg total) by mouth at bedtime.  . clonazePAM (KLONOPIN) 0.5 MG tablet 1/2 - 1 tablet q hs    Review of Systems Patient denies any headache, lightheadedness or dizziness. No sinus or allergy symptoms.   No chest pain, tightness or palpitations.  No increased shortness of breath, cough or congestion.  No nausea or vomiting.  No acid reflux.  No abdominal pain or cramping.  No bowel change, such as diarrhea, constipation, BRBPR or melana.  No urine change.  Increased hot flashes as outlined.       Objective:   Physical Exam   Filed Vitals:   03/21/14 1408  BP: 98/60  Pulse: 64  Temp: 98.9 F (71.71 C)   58 year old female in no acute distress.   HEENT:  Nares- clear.  Oropharynx - without lesions. NECK:  Supple.  Nontender.  No audible bruit.  HEART:  Appears to be regular. LUNGS:  No crackles or wheezing audible.  Respirations even and unlabored.  RADIAL PULSE:  Equal bilaterally.  ABDOMEN:  Soft, nontender.  Bowel sounds present and normal.  No audible abdominal bruit.   EXTREMITIES:  No increased edema present.  DP pulses palpable and equal bilaterally.          Assessment & Plan:  HEALTH MAINTENANCE.  Physical last visit.  Mammogram 12/22/13 - negative.  Colonoscopy 04/05/09 revealed tortuous colon otherwise normal.  Recommended f/u colonoscopy in 10 years (per pt).

## 2014-03-26 NOTE — Assessment & Plan Note (Addendum)
Has been doing well on citalopram.  Increased hot flashes recently.  Discussed increasing citalopram.  Will need to change trazodone (given the possible interaction between citalopram and trazodone).  Will increase citalopram to 10mg  (three per day).  Stop trazodone.  Will use clonazepam as directed to help with sleep.

## 2014-03-26 NOTE — Assessment & Plan Note (Signed)
Has been stable.  Worked up by hematology.  Check yearly counts.

## 2014-03-26 NOTE — Assessment & Plan Note (Signed)
Will stop trazodone since increasing citalopram.  Start clonazepam .5mg  1/2 - 1 tablet q hs.  Follow.

## 2014-04-06 ENCOUNTER — Encounter: Payer: Self-pay | Admitting: Internal Medicine

## 2014-04-20 NOTE — Telephone Encounter (Signed)
Unread mychart message mailed to patient 

## 2014-05-23 ENCOUNTER — Encounter: Payer: Self-pay | Admitting: Internal Medicine

## 2014-05-26 ENCOUNTER — Encounter: Payer: Self-pay | Admitting: Internal Medicine

## 2014-05-26 ENCOUNTER — Ambulatory Visit (INDEPENDENT_AMBULATORY_CARE_PROVIDER_SITE_OTHER): Payer: 59 | Admitting: Internal Medicine

## 2014-05-26 VITALS — BP 110/70 | HR 60 | Temp 98.4°F | Ht 66.0 in | Wt 144.2 lb

## 2014-05-26 DIAGNOSIS — D638 Anemia in other chronic diseases classified elsewhere: Secondary | ICD-10-CM

## 2014-05-26 DIAGNOSIS — N951 Menopausal and female climacteric states: Secondary | ICD-10-CM

## 2014-05-26 DIAGNOSIS — G4700 Insomnia, unspecified: Secondary | ICD-10-CM

## 2014-05-26 DIAGNOSIS — D696 Thrombocytopenia, unspecified: Secondary | ICD-10-CM

## 2014-05-26 DIAGNOSIS — R5383 Other fatigue: Secondary | ICD-10-CM

## 2014-05-26 LAB — CBC WITH DIFFERENTIAL/PLATELET
BASOS PCT: 0.6 % (ref 0.0–3.0)
Basophils Absolute: 0 10*3/uL (ref 0.0–0.1)
Eosinophils Absolute: 0.1 10*3/uL (ref 0.0–0.7)
Eosinophils Relative: 1.8 % (ref 0.0–5.0)
HEMATOCRIT: 38 % (ref 36.0–46.0)
HEMOGLOBIN: 12.5 g/dL (ref 12.0–15.0)
LYMPHS ABS: 1.6 10*3/uL (ref 0.7–4.0)
LYMPHS PCT: 31.1 % (ref 12.0–46.0)
MCHC: 33 g/dL (ref 30.0–36.0)
MCV: 91.2 fl (ref 78.0–100.0)
MONOS PCT: 6.6 % (ref 3.0–12.0)
Monocytes Absolute: 0.3 10*3/uL (ref 0.1–1.0)
NEUTROS ABS: 3.1 10*3/uL (ref 1.4–7.7)
Neutrophils Relative %: 59.9 % (ref 43.0–77.0)
Platelets: 146 10*3/uL — ABNORMAL LOW (ref 150.0–400.0)
RBC: 4.17 Mil/uL (ref 3.87–5.11)
RDW: 14.2 % (ref 11.5–15.5)
WBC: 5.2 10*3/uL (ref 4.0–10.5)

## 2014-05-26 LAB — COMPREHENSIVE METABOLIC PANEL
ALT: 16 U/L (ref 0–35)
AST: 23 U/L (ref 0–37)
Albumin: 4.2 g/dL (ref 3.5–5.2)
Alkaline Phosphatase: 82 U/L (ref 39–117)
BILIRUBIN TOTAL: 0.6 mg/dL (ref 0.2–1.2)
BUN: 15 mg/dL (ref 6–23)
CALCIUM: 9.2 mg/dL (ref 8.4–10.5)
CHLORIDE: 102 meq/L (ref 96–112)
CO2: 27 meq/L (ref 19–32)
CREATININE: 0.6 mg/dL (ref 0.4–1.2)
GFR: 109.81 mL/min (ref >60.00)
GLUCOSE: 86 mg/dL (ref 70–99)
Potassium: 4.1 mEq/L (ref 3.5–5.1)
Sodium: 139 mEq/L (ref 135–145)
Total Protein: 6.9 g/dL (ref 6.0–8.3)

## 2014-05-26 LAB — TSH: TSH: 1.52 u[IU]/mL (ref 0.35–4.50)

## 2014-05-26 NOTE — Progress Notes (Signed)
Pre visit review using our clinic review tool, if applicable. No additional management support is needed unless otherwise documented below in the visit note. 

## 2014-05-26 NOTE — Patient Instructions (Signed)
Taper celexa as we discussed.

## 2014-05-27 ENCOUNTER — Encounter: Payer: Self-pay | Admitting: Internal Medicine

## 2014-05-28 ENCOUNTER — Encounter: Payer: Self-pay | Admitting: Internal Medicine

## 2014-05-28 NOTE — Assessment & Plan Note (Signed)
Has been doing well on citalopram.  Will taper off the medication.  Follow.

## 2014-05-28 NOTE — Assessment & Plan Note (Signed)
Has been stable.  Worked up by hematology.  Given that her gums have been bleeding recently, will recheck today.

## 2014-05-28 NOTE — Assessment & Plan Note (Signed)
Issues with sleep as outlined.  After discussion, it appears this was an issue prior to trazodone.  Will taper off citalopram.  Restart trazodone.  Monitor for increased somnolence.  Knows not to drive if increased somnolence.  Will refer to neurology for further evaluation of her sleep issues.  She is in agreement.  Has had sleep study previously that was negative for sleep apnea.

## 2014-05-28 NOTE — Assessment & Plan Note (Signed)
Previous anemia.  Recheck cbc today.
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
## 2014-05-29 ENCOUNTER — Encounter: Payer: Self-pay | Admitting: Internal Medicine

## 2014-05-30 ENCOUNTER — Telehealth: Payer: Self-pay | Admitting: Internal Medicine

## 2014-05-30 NOTE — Telephone Encounter (Signed)
Pt dropped off records for Dr. Derrel Nip. Records in Dr. Lupita Dawn box.msn

## 2014-05-30 NOTE — Telephone Encounter (Signed)
Copy of a sleep study placed in results.

## 2014-05-30 NOTE — Telephone Encounter (Signed)
I could not find the sleep study.  Did she give this to you?   Thanks.

## 2014-05-31 NOTE — Telephone Encounter (Signed)
Forward copy to Dr Asencion Partridge Dohmeier ([facility]).  Pt to be scheduled for upcoming appt.  Wanted faxed.  (phone number [phone], backdoor number [phone].  Hold out until we know they have the copy, then needs to be scanned.  Sleep study placed back in your box.

## 2014-05-31 NOTE — Telephone Encounter (Signed)
Was placed in Dr. Lupita Dawn box & put in her folder. Paperwork found & placed in your green folder

## 2014-06-01 ENCOUNTER — Encounter: Payer: Self-pay | Admitting: Neurology

## 2014-06-01 NOTE — Telephone Encounter (Signed)
Copy faxed to [place]

## 2014-06-05 ENCOUNTER — Encounter: Payer: Self-pay | Admitting: Neurology

## 2014-06-05 ENCOUNTER — Ambulatory Visit (INDEPENDENT_AMBULATORY_CARE_PROVIDER_SITE_OTHER): Payer: 59 | Admitting: Neurology

## 2014-06-05 VITALS — BP 96/61 | HR 63 | Resp 16 | Ht 67.0 in | Wt 146.0 lb

## 2014-06-05 DIAGNOSIS — F5102 Adjustment insomnia: Secondary | ICD-10-CM

## 2014-06-05 MED ORDER — ZALEPLON 10 MG PO CAPS: 10.0000 mg | ORAL_CAPSULE | Freq: Every evening | ORAL | Status: DC | PRN

## 2014-06-05 MED ORDER — TRAZODONE HCL 50 MG PO TABS: 50.0000 mg | ORAL_TABLET | Freq: Every evening | ORAL | Status: DC | PRN
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
## 2014-06-05 NOTE — Patient Instructions (Addendum)
Melatonin. 5 mg  one hour before intended bedtime.        Insomnia Insomnia is frequent trouble falling and/or staying asleep. Insomnia can be a long term problem or a short term problem. Both are common. Insomnia can be a short term problem when the wakefulness is related to a certain stress or worry. Long term insomnia is often related to ongoing stress during waking hours and/or poor sleeping habits. Overtime, sleep deprivation itself can make the problem worse. Every little thing feels more severe because you are overtired and your ability to cope is decreased. CAUSES   Stress, anxiety, and depression.  Poor sleeping habits.  Distractions such as TV in the bedroom.  Naps close to bedtime.  Engaging in emotionally charged conversations before bed.  Technical reading before sleep.  Alcohol and other sedatives. They may make the problem worse. They can hurt normal sleep patterns and normal dream activity.  Stimulants such as caffeine for several hours prior to bedtime.  Pain syndromes and shortness of breath can cause insomnia.  Exercise late at night.  Changing time zones may cause sleeping problems (jet lag). It is sometimes helpful to have someone observe your sleeping patterns. They should look for periods of not breathing during the night (sleep apnea). They should also look to see how long those periods last. If you live alone or observers are uncertain, you can also be observed at a sleep clinic where your sleep patterns will be professionally monitored. Sleep apnea requires a checkup and treatment. Give your caregivers your medical history. Give your caregivers observations your family has made about your sleep.  SYMPTOMS   Not feeling rested in the morning.  Anxiety and restlessness at bedtime.  Difficulty falling and staying asleep. TREATMENT   Your caregiver may prescribe treatment for an underlying medical disorders. Your caregiver can give advice or help if you  are using alcohol or other drugs for self-medication. Treatment of underlying problems will usually eliminate insomnia problems.  Medications can be prescribed for short time use. They are generally not recommended for lengthy use.  Over-the-counter sleep medicines are not recommended for lengthy use. They can be habit forming.  You can promote easier sleeping by making lifestyle changes such as:  Using relaxation techniques that help with breathing and reduce muscle tension.  Exercising earlier in the day.  Changing your diet and the time of your last meal. No night time snacks.  Establish a regular time to go to bed.  Counseling can help with stressful problems and worry.  Soothing music and white noise may be helpful if there are background noises you cannot remove.  Stop tedious detailed work at least one hour before bedtime. HOME CARE INSTRUCTIONS   Keep a diary. Inform your caregiver about your progress. This includes any medication side effects. See your caregiver regularly. Take note of:  Times when you are asleep.  Times when you are awake during the night.  The quality of your sleep.  How you feel the next day. This information will help your caregiver care for you.  Get out of bed if you are still awake after 15 minutes. Read or do some quiet activity. Keep the lights down. Wait until you feel sleepy and go back to bed.  Keep regular sleeping and waking hours. Avoid naps.  Exercise regularly.  Avoid distractions at bedtime. Distractions include watching television or engaging in any intense or detailed activity like attempting to balance the household checkbook.  Develop a bedtime  ritual. Keep a familiar routine of bathing, brushing your teeth, climbing into bed at the same time each night, listening to soothing music. Routines increase the success of falling to sleep faster.  Use relaxation techniques. This can be using breathing and muscle tension release  routines. It can also include visualizing peaceful scenes. You can also help control troubling or intruding thoughts by keeping your mind occupied with boring or repetitive thoughts like the old concept of counting sheep. You can make it more creative like imagining planting one beautiful flower after another in your backyard garden.  During your day, work to eliminate stress. When this is not possible use some of the previous suggestions to help reduce the anxiety that accompanies stressful situations. MAKE SURE YOU:   Understand these instructions.  Will watch your condition.  Will get help right away if you are not doing well or get worse. Document Released: 08/08/2000 Document Revised: 11/03/2011 Document Reviewed: 09/08/2007 Exit Care Patient Information 2015 [place]. This information is not intended to replace advice given to you by your health care provider. Make sure you discuss any questions you have with your health care provider.

## 2014-06-06 ENCOUNTER — Encounter: Payer: Self-pay | Admitting: Internal Medicine

## 2014-06-13 ENCOUNTER — Encounter: Payer: Self-pay | Admitting: Internal Medicine

## 2014-06-16 ENCOUNTER — Encounter: Payer: Self-pay | Admitting: Internal Medicine

## 2014-06-28 ENCOUNTER — Ambulatory Visit (INDEPENDENT_AMBULATORY_CARE_PROVIDER_SITE_OTHER): Payer: 59 | Admitting: Neurology

## 2014-06-28 ENCOUNTER — Encounter: Payer: Self-pay | Admitting: Neurology

## 2014-06-28 VITALS — BP 91/58 | HR 69 | Temp 98.2°F | Resp 15 | Wt 141.5 lb

## 2014-06-28 DIAGNOSIS — G4701 Insomnia due to medical condition: Secondary | ICD-10-CM

## 2014-06-28 DIAGNOSIS — G4713 Recurrent hypersomnia: Secondary | ICD-10-CM

## 2014-06-28 HISTORY — DX: Recurrent hypersomnia: G47.13

## 2014-06-28 MED ORDER — SUVOREXANT 20 MG PO TABS: 20.0000 mg | ORAL_TABLET | Freq: Every evening | ORAL | Status: DC

## 2014-06-28 MED ORDER — ARMODAFINIL 150 MG PO TABS: 150.0000 mg | ORAL_TABLET | Freq: Every day | ORAL | Status: DC

## 2014-06-28 NOTE — Progress Notes (Deleted)
Subjective:    Patient ID: Kimberly Figueroa is a 58 y.o. female.  HPI {Common ambulatory SmartLinks:[id number]}  Review of Systems  Objective:  Neurologic Exam  Physical Exam  Assessment:   ***  Plan:   ***

## 2014-06-28 NOTE — Patient Instructions (Signed)
Suvorexant oral tablets What is this medicine? SUVOREXANT (su-vor-EX-ant) is used to treat insomnia. This medicine helps you to fall asleep and sleep through the night. This medicine may be used for other purposes; ask your health care provider or pharmacist if you have questions. COMMON BRAND NAME(S): Belsomra What should I tell my health care provider before I take this medicine? They need to know if you have any of these conditions: -depression -history of a drug or alcohol abuse problem -history of daytime sleepiness -history of sudden onset of muscle weakness (cataplexy) -liver disease -lung or breathing disease -narcolepsy -suicidal thoughts, plans, or attempt; a previous suicide attempt by you or a family member -an unusual or allergic reaction to suvorexant, other medicines, foods, dyes, or preservatives -pregnant or trying to get pregnant -breast-feeding How should I use this medicine? Take this medicine by mouth within 30 minutes of going to bed. Do not take it unless you are able to stay in bed a full night before you must be active again. Follow the directions on the prescription label. For best results, it is better to take this medicine on an empty stomach. Do not take your medicine more often than directed. Do not stop taking this medicine on your own. Always follow your doctor or health care professional's advice. A special MedGuide will be given to you by the pharmacist with each prescription and refill. Be sure to read this information carefully each time. Talk to your pediatrician regarding the use of this medicine in children. Special care may be needed. Overdosage: If you think you've taken too much of this medicine contact a poison control center or emergency room at once. Overdosage: If you think you have taken too much of this medicine contact a poison control center or emergency room at once. NOTE: This medicine is only for you. Do not share this medicine with  others. What if I miss a dose? This medicine should only be taken immediately before going to sleep. Do not take double or extra doses. What may interact with this medicine? -alcohol -antiviral medicines for HIV or AIDS -aprepitant -carbamazepine -certain antibiotics like ciprofloxacin, clarithromycin, erythromycin, telithromycin -certain medicines for depression or psychotic disturbances -certain medicines for fungal infections like ketoconazole, posaconazole, fluconazole, or itraconazole -conivaptan -digoxin -diltiazem -grapefruit juice -imatinib -medicines for anxiety or sleep -phenytoin -rifampin -verapamil This list may not describe all possible interactions. Give your health care provider a list of all the medicines, herbs, non-prescription drugs, or dietary supplements you use. Also tell them if you smoke, drink alcohol, or use illegal drugs. Some items may interact with your medicine. What should I watch for while using this medicine? Visit your doctor or health care professional for regular checks on your progress. Keep a regular sleep schedule by going to bed at about the same time each night. Avoid caffeine-containing drinks in the evening hours. When sleep medicines are used every night for more than a few weeks, they may stop working. Do not increase the dose on your own. Talk to your doctor if your insomnia worsens or is not better within 7 to 10 days. You may not be able to remember things that you do in the hours after you take this medicine. Some people have reported driving, making phone calls, or preparing and eating food while asleep after taking sleep medicine. Take this medicine right before going to sleep. Tell your doctor if you are having any problems with your memory. Do not take this medicine unless you are  able to stay in bed for a full night (7 to 8 hours) before you must be active again and do not drive or perform other activities requiring full alertness within  8 hours of a dose. Do not drive, use machinery, or do anything that needs mental alertness the day after you take the 20 mg dose of this medicine. The use of lower doses (10 mg) also has the potential to cause driving impairment the next day. You may have a decrease in mental alertness the day after use, even if you feel that you are fully awake. Tell your doctor if you will need to perform activities requiring full alertness, such as driving, the next day. You may get drowsy or dizzy. Do not stand or sit up quickly, especially if you are an older patient. This reduces the risk of dizzy or fainting spells. Alcohol may interfere with the effect of this medicine. Avoid alcoholic drinks. Do not use this medicine if you have had alcohol that evening or before bed. If you or your family notice any changes in your behavior, or if you have any unusual or disturbing thoughts such as depression or suicidal thoughts, call your doctor right away. What side effects may I notice from receiving this medicine? Side effects that you should report to your doctor or health care professional as soon as possible: -allergic reactions like skin rash, itching or hives, swelling of the face, lips, or tongue -confusion -depressed mood -feeling faint or lightheaded, falls -hallucinations -inability to move or speak for up to several minutes while you are going to sleep or waking up -memory loss -periods of leg weakness lasting from seconds to a few minutes -problems with balance, speaking, walking -restlessness, excitability, or feelings of agitation -unusual activities while asleep like driving, eating, making phone calls Side effects that usually do not require medical attention (Report these to your doctor or health care professional if they continue or are bothersome.): -daytime drowsiness -diarrhea -dizziness -headache This list may not describe all possible side effects. Call your doctor for medical advice about  side effects. You may report side effects to FDA at 1-800-FDA-1088. Where should I keep my medicine? Keep out of the reach of children. This medicine can be abused. Keep your medicine in a safe place to protect it from theft. Do not share this medicine with anyone. Selling or giving away this medicine is dangerous and against the law. Store at room temperature between 15 and 30 degrees C (59 and 86 degrees F). Throw away any unused medicine after the expiration date. NOTE: This sheet is a summary. It may not cover all possible information. If you have questions about this medicine, talk to your doctor, pharmacist, or health care provider.  2015, [employer] Standard. (2013-04-13 19:37:59)
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
## 2014-07-05 ENCOUNTER — Encounter: Payer: Self-pay | Admitting: Neurology

## 2014-07-11 ENCOUNTER — Telehealth: Payer: Self-pay | Admitting: Neurology

## 2014-07-11 DIAGNOSIS — G4701 Insomnia due to medical condition: Secondary | ICD-10-CM

## 2014-07-11 DIAGNOSIS — G4710 Hypersomnia, unspecified: Secondary | ICD-10-CM

## 2014-07-11 DIAGNOSIS — G4730 Sleep apnea, unspecified: Principal | ICD-10-CM

## 2014-07-11 MED ORDER — SUVOREXANT 20 MG PO TABS: 10.0000 mg | ORAL_TABLET | Freq: Every evening | ORAL | Status: DC

## 2014-07-11 MED ORDER — MODAFINIL 100 MG PO TABS: ORAL_TABLET | ORAL | Status: DC

## 2014-07-11 NOTE — Telephone Encounter (Signed)
i filled for generic modafinil at 100 mg daily. beslomra at 10 mg.

## 2014-07-11 NOTE — Telephone Encounter (Signed)
I called the patient back.  Got no answer.  Left message. 

## 2014-07-11 NOTE — Telephone Encounter (Signed)
Patient is requesting a new Rx for Belsomra, with a dose decrease to 10mg .  As well, she would like to know if Nuvigil can be changed to Modafinil, as it will only be a $4 co-pay for this generic med.  Please advise.  Thank you.

## 2014-07-11 NOTE — Telephone Encounter (Signed)
Pt would like for Dr. Brett Fairy to give her a new Rx for Delsomra. The drug store said that she has to fax a new Rx because the dosage is being changed.  The new dosage she be 10mg .  If you have any questions please call.  Pt is leaving town today @ 1:00pm today.  She wants to know if she can get this faxed before then. Also the Nuviguil is doing well but the copay is high and wanted to know if she could switch to proviguil the copay is only $4.  Please advise.

## 2014-07-12 ENCOUNTER — Encounter: Payer: Self-pay | Admitting: Neurology

## 2014-07-17 NOTE — Telephone Encounter (Signed)
My pleasure, Happy Thanksgiving ! CD

## 2014-07-24 ENCOUNTER — Other Ambulatory Visit: Payer: Self-pay | Admitting: *Deleted

## 2014-08-02 ENCOUNTER — Encounter: Payer: Self-pay | Admitting: Neurology

## 2014-08-02 ENCOUNTER — Encounter: Payer: Self-pay | Admitting: Internal Medicine

## 2014-08-02 DIAGNOSIS — F5101 Primary insomnia: Secondary | ICD-10-CM

## 2014-08-02 DIAGNOSIS — G4710 Hypersomnia, unspecified: Secondary | ICD-10-CM

## 2014-08-02 MED ORDER — ZOLPIDEM TARTRATE 10 MG PO TABS: 10.0000 mg | ORAL_TABLET | Freq: Every evening | ORAL | Status: DC | PRN

## 2014-08-02 NOTE — Telephone Encounter (Signed)
Please be mindful of your sleepiness and judge your ability to drive carefully.   I will write for ambien 10 mg IR and you can use it 5 days ( nights) a week to induce sleep, You are not allowed to drive for 8 hours after taking this medication. C Destanie Tibbetts.

## 2014-08-15 ENCOUNTER — Other Ambulatory Visit: Payer: Self-pay | Admitting: Neurology

## 2014-08-15 DIAGNOSIS — G4710 Hypersomnia, unspecified: Secondary | ICD-10-CM

## 2014-08-15 DIAGNOSIS — F5101 Primary insomnia: Secondary | ICD-10-CM

## 2014-08-15 MED ORDER — ZOLPIDEM TARTRATE 10 MG PO TABS: 10.0000 mg | ORAL_TABLET | Freq: Every evening | ORAL | Status: DC | PRN

## 2014-08-15 NOTE — Telephone Encounter (Signed)
Pt is calling stating that Dr. Brett Fairy was to write a Rx for zolpidem (AMBIEN) 10 MG tablet and she wants it to be faxed to [facility].  Please call patient and advise.  Their is a email stating Dr. Brett Fairy was going to write this Rx.

## 2014-08-15 NOTE — Telephone Encounter (Signed)
Request entered, forwarded to [facility] for approval since Dr D is out of the office.

## 2014-08-16 NOTE — Telephone Encounter (Signed)
Rx signed and faxed.

## 2014-09-07 ENCOUNTER — Ambulatory Visit (INDEPENDENT_AMBULATORY_CARE_PROVIDER_SITE_OTHER): Payer: 59 | Admitting: Podiatry

## 2014-09-07 ENCOUNTER — Encounter: Payer: Self-pay | Admitting: Podiatry

## 2014-09-07 VITALS — BP 103/61 | HR 74 | Resp 16 | Ht 67.0 in | Wt 145.0 lb

## 2014-09-07 DIAGNOSIS — L600 Ingrowing nail: Secondary | ICD-10-CM

## 2014-09-07 DIAGNOSIS — B351 Tinea unguium: Secondary | ICD-10-CM

## 2014-09-07 DIAGNOSIS — M7661 Achilles tendinitis, right leg: Secondary | ICD-10-CM

## 2014-09-07 DIAGNOSIS — M216X9 Other acquired deformities of unspecified foot: Secondary | ICD-10-CM

## 2014-09-07 MED ORDER — TAVABOROLE 5 % EX SOLN: 1.0000 [drp] | CUTANEOUS | Status: DC

## 2014-09-07 NOTE — Progress Notes (Signed)
Subjective:    Patient ID: Kimberly Figueroa, female    DOB: 30-Oct-1955, 59 y.o.   MRN: [id number]  HPI Comments: 59 year old female presents the office today for complaints of a knot on the bottom of her left foot which she feels has been ongoing since September 2015. She states that she has pain to the area with pressure and prolonged ambulation and certain shoes. Denies any recent injury or trauma to the area. Also states that she has nail fungus on her left big toenail which has been ongoing for approximately 2-3 years and has been getting worse. She states the toenail does not hurt. Denies any recent redness or drainage from the nail site. She also states that over the last week or so she has started topain to the back of her right heel for which she points just lateral to the Achilles tendon. She denies any injury or trauma to the area. Denies any swelling or redness overlying the area. No other complaints at this time.  Foot Pain      Review of Systems  All other systems reviewed and are negative.      Objective:   Physical Exam AAO x3, NAD DP/PT pulses palpable bilaterally, CRT less than 3 seconds Protective sensation intact with Simms Weinstein monofilament, vibratory sensation intact, Achilles tendon reflex intact Left hallux nail is dystrophic, discolored, brittle with incurvation along the medial nail border. There is slight tenderness on the distal medial aspect of the nail border. There is no tenderness along the proximal nail border. There is mild incurvation along the distal medial aspect of the right hallux nail however there is no snapping in tenderness at this time. Remaining nails without pathology. No drainage/purulence, edema, erythema from around the nail sites. On the left submetatarsal 3 there is prominent metatarsal heads with atrophy of the fat pad. There is no tenderness palpation of this time. There is no overlying edema, erythema, increase in warmth. On the  posterior aspect of the right ankle along the lateral aspect of the Achilles tendon there is mild discomfort. There is no reproduction of symptoms upon percussion of the sural nerve. There is no overlying edema, erythema, increase in warmth. The Achilles tendon appears to be intact. No other areas of pinpoint bony tenderness or pain the vibratory sensation to bilateral lower extremities. MMT 5/5, ROM WNL No pain with calf compression, swelling, warmth, erythema. No lesions or pre-ulcer lesions identified.       Assessment & Plan:  59 year old female with likely onychomycosis left hallux nail/ mild ingrown toenail deformity; prominent metatarsal heads plantarly with atrophy of the fat pad resulting metatarsalgia; likely peroneal tendinitis versus sural neuritis -Discussed possible x-rays however patient declined. -Bilateral hallux nails are sharply debrided without complication/bleeding to patient comfort. Discussed partial nail avulsion in the future if symptoms worsen or recur. -Discussed treatment options for onychomycosis. At this time the patient elects to proceed with topical treatment. A [facility] was [facility] the patient is given information follow up of the pharmacy. Side effects the medication were discussed with the patient and directed to call the office should any occur. -Discussed likely etiology of left forefoot pain. Discussed metatarsal pads. -Discussed stretching exercises for Achilles tendinitis. Ice to the area. Currently, symptoms appear to be mild. Continue to monitor.  -Follow-up as needed. Discussed with the patient to follow-up sooner should any problems arise or any changes symptoms. Encouraged to call the office with any questions, concerns, change in symptoms.

## 2014-09-07 NOTE — Patient Instructions (Signed)
Achilles Tendinitis   with Rehab  Achilles tendinitis is a disorder of the Achilles tendon. The Achilles tendon connects the large calf muscles (Gastrocnemius and Soleus) to the heel bone (calcaneus). This tendon is sometimes called the heel cord. It is important for pushing-off and standing on your toes and is important for walking, running, or jumping. Tendinitis is often caused by overuse and repetitive microtrauma.  SYMPTOMS  · Pain, tenderness, swelling, warmth, and redness may occur over the Achilles tendon even at rest.  · Pain with pushing off, or flexing or extending the ankle.  · Pain that is worsened after or during activity.  CAUSES   · Overuse sometimes seen with rapid increase in exercise programs or in sports requiring running and jumping.  · Poor physical conditioning (strength and flexibility or endurance).  · Running sports, especially training running down hills.  · Inadequate warm-up before practice or play or failure to stretch before participation.  · Injury to the tendon.  PREVENTION   · Warm up and stretch before practice or competition.  · Allow time for adequate rest and recovery between practices and competition.  · Keep up conditioning.  ¨ Keep up ankle and leg flexibility.  ¨ Improve or keep muscle strength and endurance.  ¨ Improve cardiovascular fitness.  · Use proper technique.  · Use proper equipment (shoes, skates).  · To help prevent recurrence, taping, protective strapping, or an adhesive bandage may be recommended for several weeks after healing is complete.  PROGNOSIS   · Recovery may take weeks to several months to heal.  · Longer recovery is expected if symptoms have been prolonged.  · Recovery is usually quicker if the inflammation is due to a direct blow as compared with overuse or sudden strain.  RELATED COMPLICATIONS   · Healing time will be prolonged if the condition is not correctly treated. The injury must be given plenty of time to heal.  · Symptoms can reoccur if  activity is resumed too soon.  · Untreated, tendinitis may increase the risk of tendon rupture requiring additional time for recovery and possibly surgery.  TREATMENT   · The first treatment consists of rest anti-inflammatory medication, and ice to relieve the pain.  · Stretching and strengthening exercises after resolution of pain will likely help reduce the risk of recurrence. Referral to a physical therapist or athletic trainer for further evaluation and treatment may be helpful.  · A walking boot or cast may be recommended to rest the Achilles tendon. This can help break the cycle of inflammation and microtrauma.  · Arch supports (orthotics) may be prescribed or recommended by your caregiver as an adjunct to therapy and rest.  · Surgery to remove the inflamed tendon lining or degenerated tendon tissue is rarely necessary and has shown less than predictable results.  MEDICATION   · Nonsteroidal anti-inflammatory medications, such as aspirin and ibuprofen, may be used for pain and inflammation relief. Do not take within 7 days before surgery. Take these as directed by your caregiver. Contact your caregiver immediately if any bleeding, stomach upset, or signs of allergic reaction occur. Other minor pain relievers, such as acetaminophen, may also be used.  · Pain relievers may be prescribed as necessary by your caregiver. Do not take prescription pain medication for longer than 4 to 7 days. Use only as directed and only as much as you need.  · Cortisone injections are rarely indicated. Cortisone injections may weaken tendons and predispose to rupture. It is better   to give the condition more time to heal than to use them.  HEAT AND COLD  · Cold is used to relieve pain and reduce inflammation for acute and chronic Achilles tendinitis. Cold should be applied for 10 to 15 minutes every 2 to 3 hours for inflammation and pain and immediately after any activity that aggravates your symptoms. Use ice packs or an ice  massage.  · Heat may be used before performing stretching and strengthening activities prescribed by your caregiver. Use a heat pack or a warm soak.  SEEK MEDICAL CARE IF:  · Symptoms get worse or do not improve in 2 weeks despite treatment.  · New, unexplained symptoms develop. Drugs used in treatment may produce side effects.  EXERCISES  RANGE OF MOTION (ROM) AND STRETCHING EXERCISES - Achilles Tendinitis   These exercises may help you when beginning to rehabilitate your injury. Your symptoms may resolve with or without further involvement from your physician, physical therapist or athletic trainer. While completing these exercises, remember:   · Restoring tissue flexibility helps normal motion to return to the joints. This allows healthier, less painful movement and activity.  · An effective stretch should be held for at least 30 seconds.  · A stretch should never be painful. You should only feel a gentle lengthening or release in the stretched tissue.  STRETCH - Gastroc, Standing   · Place hands on wall.  · Extend right / left leg, keeping the front knee somewhat bent.  · Slightly point your toes inward on your back foot.  · Keeping your right / left heel on the floor and your knee straight, shift your weight toward the wall, not allowing your back to arch.  · You should feel a gentle stretch in the right / left calf. Hold this position for __________ seconds.  Repeat __________ times. Complete this stretch __________ times per day.  STRETCH - Soleus, Standing   · Place hands on wall.  · Extend right / left leg, keeping the other knee somewhat bent.  · Slightly point your toes inward on your back foot.  · Keep your right / left heel on the floor, bend your back knee, and slightly shift your weight over the back leg so that you feel a gentle stretch deep in your back calf.  · Hold this position for __________ seconds.  Repeat __________ times. Complete this stretch __________ times per day.  STRETCH -  Gastrocsoleus, Standing   Note: This exercise can place a lot of stress on your foot and ankle. Please complete this exercise only if specifically instructed by your caregiver.   · Place the ball of your right / left foot on a step, keeping your other foot firmly on the same step.  · Hold on to the wall or a rail for balance.  · Slowly lift your other foot, allowing your body weight to press your heel down over the edge of the step.  · You should feel a stretch in your right / left calf.  · Hold this position for __________ seconds.  · Repeat this exercise with a slight bend in your knee.  Repeat __________ times. Complete this stretch __________ times per day.   STRENGTHENING EXERCISES - Achilles Tendinitis  These exercises may help you when beginning to rehabilitate your injury. They may resolve your symptoms with or without further involvement from your physician, physical therapist or athletic trainer. While completing these exercises, remember:   · Muscles can gain both the endurance   and the strength needed for everyday activities through controlled exercises.  · Complete these exercises as instructed by your physician, physical therapist or athletic trainer. Progress the resistance and repetitions only as guided.  · You may experience muscle soreness or fatigue, but the pain or discomfort you are trying to eliminate should never worsen during these exercises. If this pain does worsen, stop and make certain you are following the directions exactly. If the pain is still present after adjustments, discontinue the exercise until you can discuss the trouble with your clinician.  STRENGTH - Plantar-flexors   · Sit with your right / left leg extended. Holding onto both ends of a rubber exercise band/tubing, loop it around the ball of your foot. Keep a slight tension in the band.  · Slowly push your toes away from you, pointing them downward.  · Hold this position for __________ seconds. Return slowly, controlling the  tension in the band/tubing.  Repeat __________ times. Complete this exercise __________ times per day.   STRENGTH - Plantar-flexors   · Stand with your feet shoulder width apart. Steady yourself with a wall or table using as little support as needed.  · Keeping your weight evenly spread over the width of your feet, rise up on your toes.*  · Hold this position for __________ seconds.  Repeat __________ times. Complete this exercise __________ times per day.   *If this is too easy, shift your weight toward your right / left leg until you feel challenged. Ultimately, you may be asked to do this exercise with your right / left foot only.  STRENGTH - Plantar-flexors, Eccentric   Note: This exercise can place a lot of stress on your foot and ankle. Please complete this exercise only if specifically instructed by your caregiver.   · Place the balls of your feet on a step. With your hands, use only enough support from a wall or rail to keep your balance.  · Keep your knees straight and rise up on your toes.  · Slowly shift your weight entirely to your right / left toes and pick up your opposite foot. Gently and with controlled movement, lower your weight through your right / left foot so that your heel drops below the level of the step. You will feel a slight stretch in the back of your calf at the end position.  · Use the healthy leg to help rise up onto the balls of both feet, then lower weight only on the right / left leg again. Build up to 15 repetitions. Then progress to 3 consecutive sets of 15 repetitions.*  · After completing the above exercise, complete the same exercise with a slight knee bend (about 30 degrees). Again, build up to 15 repetitions. Then progress to 3 consecutive sets of 15 repetitions.*  Perform this exercise __________ times per day.   *When you easily complete 3 sets of 15, your physician, physical therapist or athletic trainer may advise you to add resistance by wearing a backpack filled with  additional weight.  STRENGTH - Plantar Flexors, Seated   · Sit on a chair that allows your feet to rest flat on the ground. If necessary, sit at the edge of the chair.  · Keeping your toes firmly on the ground, lift your right / left heel as far as you can without increasing any discomfort in your ankle.  Repeat __________ times. Complete this exercise __________ times a day.  *If instructed by your physician, physical therapist or athletic   trainer, you may add ____________________ of resistance by placing a weighted object on your right / left knee.  Document Released: 03/12/2005 Document Revised: 11/03/2011 Document Reviewed: 11/23/2008  [employer]® Patient Information ©2015 [employer]. This information is not intended to replace advice given to you by your health care provider. Make sure you discuss any questions you have with your health care provider.

## 2014-09-11 ENCOUNTER — Other Ambulatory Visit: Payer: Self-pay | Admitting: Neurology

## 2014-09-11 ENCOUNTER — Telehealth: Payer: Self-pay | Admitting: *Deleted

## 2014-09-11 NOTE — Telephone Encounter (Signed)
Pt states she lost the forms with the name and number of the pharmacy that will deliver the prescription.  Pt was prescribed [place] [facility] and the pharmacy is [facility] [phone], was called to pt.

## 2014-09-12 NOTE — Telephone Encounter (Signed)
Rx signed and faxed.

## 2014-09-18 ENCOUNTER — Encounter: Payer: Self-pay | Admitting: Internal Medicine

## 2014-09-21 ENCOUNTER — Encounter: Payer: 59 | Admitting: Internal Medicine

## 2014-09-24 ENCOUNTER — Ambulatory Visit (INDEPENDENT_AMBULATORY_CARE_PROVIDER_SITE_OTHER): Payer: 59 | Admitting: Neurology

## 2014-09-24 DIAGNOSIS — F5104 Psychophysiologic insomnia: Secondary | ICD-10-CM

## 2014-09-24 DIAGNOSIS — G4700 Insomnia, unspecified: Secondary | ICD-10-CM

## 2014-09-24 DIAGNOSIS — G4701 Insomnia due to medical condition: Secondary | ICD-10-CM

## 2014-09-24 NOTE — Sleep Study (Signed)
Please see the scanned sleep study interpretation located in the Procedure tab within the Chart Review section. 

## 2014-09-27 ENCOUNTER — Encounter: Payer: Self-pay | Admitting: Neurology

## 2014-10-10 ENCOUNTER — Telehealth: Payer: Self-pay | Admitting: *Deleted

## 2014-10-10 NOTE — Telephone Encounter (Signed)
Patient was contacted and provided the results of her sleep study which did reveal significant movements in her sleep.  Dr. Brett Fairy requested a 30 minute re-visit to go over the results with the patient.  When contacted the patient was driving and was not able to look at her calendar.  Patient to call back to schedule the follow up appointment.  Dr. Einar Pheasant was sent a copy of the report.

## 2014-10-11 ENCOUNTER — Encounter: Payer: Self-pay | Admitting: Neurology

## 2014-10-12 DIAGNOSIS — G4701 Insomnia due to medical condition: Secondary | ICD-10-CM | POA: Insufficient documentation

## 2014-10-12 DIAGNOSIS — F5104 Psychophysiologic insomnia: Secondary | ICD-10-CM | POA: Insufficient documentation

## 2014-10-27 ENCOUNTER — Ambulatory Visit (INDEPENDENT_AMBULATORY_CARE_PROVIDER_SITE_OTHER): Payer: 59 | Admitting: Internal Medicine

## 2014-10-27 ENCOUNTER — Encounter: Payer: Self-pay | Admitting: Internal Medicine

## 2014-10-27 VITALS — BP 110/70 | HR 61 | Temp 98.1°F | Ht 67.0 in | Wt 148.5 lb

## 2014-10-27 DIAGNOSIS — Z0000 Encounter for general adult medical examination without abnormal findings: Secondary | ICD-10-CM

## 2014-10-27 DIAGNOSIS — G4700 Insomnia, unspecified: Secondary | ICD-10-CM

## 2014-10-27 DIAGNOSIS — N951 Menopausal and female climacteric states: Secondary | ICD-10-CM

## 2014-10-27 DIAGNOSIS — D638 Anemia in other chronic diseases classified elsewhere: Secondary | ICD-10-CM

## 2014-10-27 DIAGNOSIS — D696 Thrombocytopenia, unspecified: Secondary | ICD-10-CM

## 2014-10-27 DIAGNOSIS — Z13220 Encounter for screening for lipoid disorders: Secondary | ICD-10-CM

## 2014-10-27 DIAGNOSIS — Z1239 Encounter for other screening for malignant neoplasm of breast: Secondary | ICD-10-CM

## 2014-10-27 NOTE — Progress Notes (Signed)
Pre visit review using our clinic review tool, if applicable. No additional management support is needed unless otherwise documented below in the visit note. 

## 2014-11-05 ENCOUNTER — Encounter: Payer: Self-pay | Admitting: Internal Medicine

## 2014-11-05 DIAGNOSIS — Z0000 Encounter for general adult medical examination without abnormal findings: Secondary | ICD-10-CM | POA: Insufficient documentation

## 2014-11-05 NOTE — Assessment & Plan Note (Signed)
On citalopram.  Follow.  Stable.   

## 2014-11-05 NOTE — Assessment & Plan Note (Signed)
Colonoscopy 02/08/14 - normal.  Last hgb wnl.  Follow.

## 2014-11-05 NOTE — Progress Notes (Signed)
Patient ID: Kimberly Figueroa, female   DOB: [birth year], 57 y.o.   MRN: [id number]   Subjective:    Patient ID: Kimberly Figueroa, female    DOB: [birth year], 59 y.o.   MRN: [id number]  HPI  Patient here for her physical exam.  Has been seeing Dr Brett Fairy for her sleep issues.  Now on [place].  Sleeping five hours per night.  Does feel better.  She is also taking provigil.  Has planned f/u soon.  No headache reported.  Does have some right shoulder pain.  Is doing her exercises.  Desires no further intervention.  Breathing stable.  Tries to stay active.  No cardiac symptoms with increased activity or exertion.  Bowels stable.    Past Medical History  Diagnosis Date  . Endometriosis   . History of frequent urinary tract infections     s/p bladder dilatation (11/05)  . ITP (idiopathic thrombocytopenic purpura)   . Pneumonia   . Anemia   . Hypersomnia, recurrent 06/28/2014    Current Outpatient Prescriptions on File Prior to Visit  Medication Sig Dispense Refill  . citalopram (CELEXA) 10 MG tablet 5 mg 2 (two) times daily. Take 5 mg daily.    Marland Kitchen MELATONIN PO Take by mouth daily. Take one hour before bedtime    . modafinil (PROVIGIL) 100 MG tablet One a day po AM. 30 tablet 5  . Multiple Vitamins-Minerals (MULTIVITAMIN GUMMIES ADULTS PO) Take by mouth. VitaCraves    . Tavaborole (KERYDIN) 5 % SOLN Apply 1 drop topically 1 day or 1 dose. Apply 1 drop to the toenail daily. 1 Bottle 2  . zolpidem (AMBIEN) 10 MG tablet TAKE 1 TABLET BY MOUTH NIGHTLY AT BEDTIME AS NEEDED FOR SLEEP 30 tablet 5   No current facility-administered medications on file prior to visit.    Review of Systems  Constitutional: Negative for appetite change and unexpected weight change.  HENT: Negative for congestion and sinus pressure.   Respiratory: Negative for cough, chest tightness and shortness of breath.   Cardiovascular: Negative for chest pain, palpitations and leg swelling.  Gastrointestinal: Negative for  nausea, vomiting, abdominal pain and diarrhea.  Genitourinary: Negative for dysuria and difficulty urinating.  Musculoskeletal: Negative for back pain and joint swelling.       Right shoulder pain as outlined.    Skin: Negative for pallor and rash.  Neurological: Negative for dizziness, light-headedness and headaches.  Hematological: Negative for adenopathy. Does not bruise/bleed easily.  Psychiatric/Behavioral: Negative for dysphoric mood and agitation.       Objective:    Physical Exam  Constitutional: She is oriented to person, place, and time. She appears well-developed and well-nourished.  HENT:  Nose: Nose normal.  Mouth/Throat: Oropharynx is clear and moist.  Eyes: Right eye exhibits no discharge. Left eye exhibits no discharge. No scleral icterus.  Neck: Neck supple. No thyromegaly present.  Cardiovascular: Normal rate and regular rhythm.   Pulmonary/Chest: Breath sounds normal. No accessory muscle usage. No tachypnea. No respiratory distress. She has no decreased breath sounds. She has no wheezes. She has no rhonchi. Right breast exhibits no inverted nipple, no mass, no nipple discharge and no tenderness (no axillary adenopathy). Left breast exhibits no inverted nipple, no mass, no nipple discharge and no tenderness (no axilarry adenopathy).  Abdominal: Soft. Bowel sounds are normal. There is no tenderness.  Musculoskeletal: She exhibits no edema or tenderness.  Lymphadenopathy:    She has no cervical adenopathy.  Neurological: She is alert and oriented to  person, place, and time.  Skin: No rash noted. No erythema.  Psychiatric: She has a normal mood and affect. Her behavior is normal.    BP 110/70 mmHg  Pulse 61  Temp(Src) 98.1 F (36.7 C) (Oral)  Ht 5\' 7"  (1.702 m)  Wt 148 lb 8 oz (67.359 kg)  BMI 23.25 kg/m2  SpO2 98% Wt Readings from Last 3 Encounters:  10/27/14 148 lb 8 oz (67.359 kg)  09/07/14 145 lb (65.772 kg)  06/28/14 141 lb 8 oz (64.184 kg)     Lab  Results  Component Value Date   WBC 5.2 05/26/2014   HGB 12.5 05/26/2014   HCT 38.0 05/26/2014   PLT 146.0* 05/26/2014   GLUCOSE 86 05/26/2014   CHOL 160 09/26/2013   TRIG 35.0 09/26/2013   HDL 68.50 09/26/2013   LDLCALC 85 09/26/2013   ALT 16 05/26/2014   AST 23 05/26/2014   NA 139 05/26/2014   K 4.1 05/26/2014   CL 102 05/26/2014   CREATININE 0.6 05/26/2014   BUN 15 05/26/2014   CO2 27 05/26/2014   TSH 1.52 05/26/2014       Assessment & Plan:   Problem List Items Addressed This Visit    Anemia    Colonoscopy 02/08/14 - normal.  Last hgb wnl.  Follow.       Relevant Orders   CBC with Differential/Platelet   Health care maintenance    Physical today.  Is s/p hysterectomy.  Schedule mammogram.  Colonoscopy 01/2014 - normal.        Insomnia    Seeing Dr Brett Fairy.  On Lorrin Mais now.  Has f/u soon.  Is sleeping some better.        Relevant Orders   Comprehensive metabolic panel   Menopausal symptoms    On citalopram.  Follow.  Stable.        Relevant Orders   Comprehensive metabolic panel   Thrombocytopenia - Primary    Previously worked up by hematology.  Platelet count has been stable.  Follow cbc.  Recheck with next labs.        Relevant Orders   CBC with Differential/Platelet    Other Visit Diagnoses    Screening cholesterol level        Relevant Orders    Lipid panel    Breast cancer screening        Relevant Orders    MM DIGITAL SCREENING BILATERAL        Einar Pheasant, MD

## 2014-11-05 NOTE — Assessment & Plan Note (Signed)
Previously worked up by hematology.  Platelet count has been stable.  Follow cbc.  Recheck with next labs.

## 2014-11-05 NOTE — Assessment & Plan Note (Signed)
Seeing Dr Brett Fairy.  On Lorrin Mais now.  Has f/u soon.  Is sleeping some better.

## 2014-11-05 NOTE — Assessment & Plan Note (Signed)
Physical today.  Is s/p hysterectomy.  Schedule mammogram.  Colonoscopy 01/2014 - normal.

## 2014-11-08 ENCOUNTER — Ambulatory Visit (INDEPENDENT_AMBULATORY_CARE_PROVIDER_SITE_OTHER): Payer: 59 | Admitting: Neurology

## 2014-11-08 ENCOUNTER — Encounter: Payer: Self-pay | Admitting: Neurology

## 2014-11-08 VITALS — BP 92/61 | HR 66 | Resp 12 | Ht 67.0 in | Wt 142.6 lb

## 2014-11-08 DIAGNOSIS — G4730 Sleep apnea, unspecified: Secondary | ICD-10-CM

## 2014-11-08 DIAGNOSIS — G4710 Hypersomnia, unspecified: Secondary | ICD-10-CM

## 2014-11-08 MED ORDER — ZOLPIDEM TARTRATE 10 MG PO TABS: 10.0000 mg | ORAL_TABLET | Freq: Every evening | ORAL | Status: DC | PRN

## 2014-11-08 MED ORDER — MODAFINIL 100 MG PO TABS: ORAL_TABLET | ORAL | Status: DC

## 2014-11-08 MED ORDER — ROPINIROLE HCL 0.25 MG PO TABS: 0.2500 mg | ORAL_TABLET | Freq: Every day | ORAL | Status: DC

## 2014-11-08 NOTE — Patient Instructions (Signed)
Period limb movement; I prescribed to a very low daily dose of a dopaminergic agonist named Requip this is usually use of the treatment of restless legs which she don't have. It is a when necessary medication in your case I would like for you to try taking one at night and seeing if it further improves your sleep it may be that some of the arousals at night are related to periodic limb movements of which are not aware. If it has no effect within a month I don't expect you to continue using it.
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
## 2014-11-13 ENCOUNTER — Other Ambulatory Visit: Payer: 59

## 2014-11-20 ENCOUNTER — Other Ambulatory Visit (INDEPENDENT_AMBULATORY_CARE_PROVIDER_SITE_OTHER): Payer: 59

## 2014-11-20 DIAGNOSIS — G4700 Insomnia, unspecified: Secondary | ICD-10-CM | POA: Diagnosis not present

## 2014-11-20 DIAGNOSIS — D696 Thrombocytopenia, unspecified: Secondary | ICD-10-CM | POA: Diagnosis not present

## 2014-11-20 DIAGNOSIS — Z13220 Encounter for screening for lipoid disorders: Secondary | ICD-10-CM

## 2014-11-20 DIAGNOSIS — D638 Anemia in other chronic diseases classified elsewhere: Secondary | ICD-10-CM | POA: Diagnosis not present

## 2014-11-20 DIAGNOSIS — N951 Menopausal and female climacteric states: Secondary | ICD-10-CM | POA: Diagnosis not present

## 2014-11-20 LAB — LIPID PANEL
CHOLESTEROL: 141 mg/dL (ref 0–200)
HDL: 56.9 mg/dL (ref >39.00)
LDL CALC: 73 mg/dL (ref 0–99)
NonHDL: 84.1
TRIGLYCERIDES: 55 mg/dL (ref 0.0–149.0)
Total CHOL/HDL Ratio: 2
VLDL: 11 mg/dL (ref 0.0–40.0)

## 2014-11-20 LAB — COMPREHENSIVE METABOLIC PANEL
ALT: 24 U/L (ref 0–35)
AST: 28 U/L (ref 0–37)
Albumin: 3.9 g/dL (ref 3.5–5.2)
Alkaline Phosphatase: 67 U/L (ref 39–117)
BUN: 11 mg/dL (ref 6–23)
CHLORIDE: 104 meq/L (ref 96–112)
CO2: 31 mEq/L (ref 19–32)
Calcium: 9.3 mg/dL (ref 8.4–10.5)
Creatinine, Ser: 0.65 mg/dL (ref 0.40–1.20)
GFR: 99.95 mL/min (ref >60.00)
Glucose, Bld: 91 mg/dL (ref 70–99)
Potassium: 3.9 mEq/L (ref 3.5–5.1)
SODIUM: 140 meq/L (ref 135–145)
Total Bilirubin: 0.4 mg/dL (ref 0.2–1.2)
Total Protein: 6.5 g/dL (ref 6.0–8.3)

## 2014-11-20 LAB — CBC WITH DIFFERENTIAL/PLATELET
BASOS PCT: 0.7 % (ref 0.0–3.0)
Basophils Absolute: 0 10*3/uL (ref 0.0–0.1)
Eosinophils Absolute: 0.1 10*3/uL (ref 0.0–0.7)
Eosinophils Relative: 2.8 % (ref 0.0–5.0)
HCT: 37.6 % (ref 36.0–46.0)
Hemoglobin: 12.4 g/dL (ref 12.0–15.0)
LYMPHS PCT: 32.3 % (ref 12.0–46.0)
Lymphs Abs: 1.2 10*3/uL (ref 0.7–4.0)
MCHC: 33 g/dL (ref 30.0–36.0)
MCV: 90.5 fl (ref 78.0–100.0)
MONOS PCT: 6.9 % (ref 3.0–12.0)
Monocytes Absolute: 0.3 10*3/uL (ref 0.1–1.0)
NEUTROS PCT: 57.3 % (ref 43.0–77.0)
Neutro Abs: 2.1 10*3/uL (ref 1.4–7.7)
PLATELETS: 141 10*3/uL — AB (ref 150.0–400.0)
RBC: 4.16 Mil/uL (ref 3.87–5.11)
RDW: 14 % (ref 11.5–15.5)
WBC: 3.8 10*3/uL — AB (ref 4.0–10.5)

## 2014-11-22 ENCOUNTER — Telehealth: Payer: Self-pay | Admitting: Internal Medicine

## 2014-11-22 ENCOUNTER — Encounter: Payer: Self-pay | Admitting: Internal Medicine

## 2014-11-22 DIAGNOSIS — D72819 Decreased white blood cell count, unspecified: Secondary | ICD-10-CM

## 2014-11-22 NOTE — Telephone Encounter (Signed)
Pt was notified of labs via my chart.  Needs a f/u non fasting lab appt in 3-4 weeks.  Please notify the patient of the appointment date and time.  Thanks.

## 2014-12-01 ENCOUNTER — Encounter: Payer: Self-pay | Admitting: Internal Medicine

## 2014-12-01 ENCOUNTER — Telehealth: Payer: Self-pay | Admitting: *Deleted

## 2014-12-01 NOTE — Telephone Encounter (Signed)
Ok to refill x 2  

## 2014-12-01 NOTE — Telephone Encounter (Signed)
Refill request received from [facility] for Progesterone 1% HRT CMP Apply 1 mL topically once daily for 6 days out of the week. #30 Last refilled 10/09/14 Last OV 10/27/14 Ok refill?

## 2014-12-04 NOTE — Telephone Encounter (Signed)
Rx phoned into pharmacy.

## 2015-01-09 ENCOUNTER — Encounter: Payer: Self-pay | Admitting: Internal Medicine

## 2015-01-28 ENCOUNTER — Other Ambulatory Visit: Payer: Self-pay | Admitting: Internal Medicine

## 2015-01-29 ENCOUNTER — Other Ambulatory Visit: Payer: Self-pay | Admitting: Internal Medicine

## 2015-01-29 DIAGNOSIS — F5102 Adjustment insomnia: Secondary | ICD-10-CM

## 2015-02-19 ENCOUNTER — Encounter: Payer: Self-pay | Admitting: Internal Medicine

## 2015-05-04 ENCOUNTER — Ambulatory Visit: Payer: Self-pay | Admitting: Internal Medicine

## 2015-05-10 ENCOUNTER — Encounter: Payer: Self-pay | Admitting: Neurology

## 2015-05-11 ENCOUNTER — Other Ambulatory Visit: Payer: Self-pay | Admitting: Neurology

## 2015-05-11 NOTE — Telephone Encounter (Signed)
Duplicate

## 2015-05-14 ENCOUNTER — Other Ambulatory Visit: Payer: Self-pay

## 2015-05-14 ENCOUNTER — Ambulatory Visit: Payer: 59 | Admitting: Neurology

## 2015-05-29 ENCOUNTER — Encounter: Payer: Self-pay | Admitting: Internal Medicine

## 2015-05-29 ENCOUNTER — Ambulatory Visit (INDEPENDENT_AMBULATORY_CARE_PROVIDER_SITE_OTHER): Payer: 59 | Admitting: Internal Medicine

## 2015-05-29 VITALS — BP 112/70 | HR 61 | Temp 98.3°F | Resp 18 | Ht 67.0 in | Wt 143.5 lb

## 2015-05-29 DIAGNOSIS — D72819 Decreased white blood cell count, unspecified: Secondary | ICD-10-CM | POA: Diagnosis not present

## 2015-05-29 DIAGNOSIS — F5104 Psychophysiologic insomnia: Secondary | ICD-10-CM

## 2015-05-29 DIAGNOSIS — D696 Thrombocytopenia, unspecified: Secondary | ICD-10-CM

## 2015-05-29 DIAGNOSIS — N951 Menopausal and female climacteric states: Secondary | ICD-10-CM

## 2015-05-29 DIAGNOSIS — F439 Reaction to severe stress, unspecified: Secondary | ICD-10-CM

## 2015-05-29 DIAGNOSIS — R194 Change in bowel habit: Secondary | ICD-10-CM

## 2015-05-29 DIAGNOSIS — D638 Anemia in other chronic diseases classified elsewhere: Secondary | ICD-10-CM | POA: Diagnosis not present

## 2015-05-29 DIAGNOSIS — Z658 Other specified problems related to psychosocial circumstances: Secondary | ICD-10-CM

## 2015-05-29 DIAGNOSIS — G4700 Insomnia, unspecified: Secondary | ICD-10-CM

## 2015-05-29 DIAGNOSIS — R198 Other specified symptoms and signs involving the digestive system and abdomen: Secondary | ICD-10-CM

## 2015-05-29 MED ORDER — CITALOPRAM HYDROBROMIDE 20 MG PO TABS: ORAL_TABLET | ORAL | Status: DC

## 2015-05-29 NOTE — Progress Notes (Signed)
Pre-visit discussion using our clinic review tool. No additional management support is needed unless otherwise documented below in the visit note.  

## 2015-05-29 NOTE — Progress Notes (Signed)
Patient ID: Kimberly Figueroa, female   DOB: August 01, 1956, 59 y.o.   MRN: [id number]   Subjective:    Patient ID: Kimberly Figueroa, female    DOB: [birth year], 59 y.o.   MRN: [id number]  HPI  Patient with past history of thrombocytopenia and insomnia.  She comes in today for a scheduled follow up.  Has been seeing neurology for her insomnia.  See neurology's note for details.  Using [place] now.  This works for her.  Takes provigil prn.  Tries to stay active.  No cardiac symptoms with increased activity or exertion.  No sob.  Some change in bowels.  States will have days where she has some urgency.  Has to go right then.  Some increased gas.  She then may have episodes where she goes for 3-4 days without a bowel movement.  Harder stool.  Noticed some BRB on one occasion.  Noticed with wiping and in stool.  Has not had episode since.  No abdominal pain or cramping.  Good appetite.  No vomiting.  Feels needs something more for anxiety and stress.  She is on citalopram.  Needs something more.    Past Medical History  Diagnosis Date  . Endometriosis   . History of frequent urinary tract infections     s/p bladder dilatation (11/05)  . ITP (idiopathic thrombocytopenic purpura)   . Pneumonia   . Hypersomnia, recurrent 06/28/2014   Past Surgical History  Procedure Laterality Date  . Tonsillectomy  1961  . Cesarean section  1993  . Vaginal delivery  1987  . Bladder dilatation  11/05  . Laproscopic surgery  10/06    found to have endometriosis  . Vaginal hysterectomy  02/07  . Shoulder surgery Left 2008  . Colonoscopy     Family History  Problem Relation Age of Onset  . Alcohol abuse Mother   . Varicose Veins Mother   . Diabetes Father   . Hearing loss Father   . Heart disease Father   . Crohn's disease Father   . Breast cancer Neg Hx   . Colon cancer Neg Hx   . Crohn's disease Paternal Grandfather    Social History   Social History  . Marital Status: Married    Spouse Name:  Gwyndolyn Saxon  . Number of Children: 2  . Years of Education: college   Occupational History  . 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 Smoking status: Never Smoker   . Smokeless tobacco: Never Used  . Alcohol Use: No  . Drug Use: No  . Sexual Activity: Not Asked   Other Topics Concern  . None   Social History Narrative   Patient consumes 6-8 cups tea daily, is right handed    Outpatient Encounter Prescriptions as of 05/29/2015  Medication Sig  . modafinil (PROVIGIL) 100 MG tablet One a day po AM. (Patient taking differently: as needed. One a day po AM.)  . Multiple Vitamins-Minerals (MULTIVITAMIN GUMMIES ADULTS PO) Take by mouth. VitaCraves  . Progesterone Micronized 10 % CREA Place onto the skin.  . Tavaborole (KERYDIN) 5 % SOLN Apply 1 drop topically 1 day or 1 dose. Apply 1 drop to the toenail daily.  Marland Kitchen zolpidem (AMBIEN) 10 MG tablet TAKE ONE TABLET BY MOUTH AT BEDTIME AS NEEDED FOR SLEEP  . [DISCONTINUED] citalopram (CELEXA) 10 MG tablet TAKE 3 TABLETS BY MOUTH DAILY (Patient taking differently: TAKE 2 TABLETS BY MOUTH DAILY)  . [DISCONTINUED] rOPINIRole (REQUIP) 0.25 MG  tablet Take 1 tablet (0.25 mg total) by mouth at bedtime.  . citalopram (CELEXA) 20 MG tablet Take two tablets daily.  . [DISCONTINUED] citalopram (CELEXA) 10 MG tablet 5 mg daily. Pt states takes 2 tabs in am for hot flashes.   Not aware of tab strength..   No facility-administered encounter medications on file as of 05/29/2015.    Review of Systems  Constitutional: Negative for appetite change and unexpected weight change.  HENT: Negative for congestion and sinus pressure.   Eyes: Negative for pain and visual disturbance.  Respiratory: Negative for cough, chest tightness and shortness of breath.   Cardiovascular: Negative for chest pain, palpitations and leg swelling.  Gastrointestinal: Negative for nausea, vomiting and abdominal pain.       Bowel change as outlined.    Genitourinary:  Negative for dysuria and difficulty urinating.  Musculoskeletal: Negative for back pain and joint swelling.  Skin: Negative for color change and rash.  Neurological: Negative for dizziness, light-headedness and headaches.  Psychiatric/Behavioral:       Increased stress as outlined.  Needs something more.         Objective:     Blood pressure rechecked by me:  102/64  Physical Exam  Constitutional: She appears well-developed and well-nourished. No distress.  HENT:  Nose: Nose normal.  Mouth/Throat: Oropharynx is clear and moist.  Eyes: Conjunctivae are normal. Right eye exhibits no discharge. Left eye exhibits no discharge.  Neck: Neck supple. No thyromegaly present.  Cardiovascular: Normal rate and regular rhythm.   Pulmonary/Chest: Breath sounds normal. No respiratory distress. She has no wheezes.  Abdominal: Soft. Bowel sounds are normal. There is no tenderness.  Musculoskeletal: She exhibits no edema or tenderness.  Lymphadenopathy:    She has no cervical adenopathy.  Skin: No rash noted. No erythema.  Psychiatric: She has a normal mood and affect. Her behavior is normal.    BP 112/70 mmHg  Pulse 61  Temp(Src) 98.3 F (36.8 C) (Oral)  Resp 18  Ht 5\' 7"  (1.702 m)  Wt 143 lb 8 oz (65.091 kg)  BMI 22.47 kg/m2  SpO2 96% Wt Readings from Last 3 Encounters:  05/29/15 143 lb 8 oz (65.091 kg)  11/08/14 142 lb 9.6 oz (64.683 kg)  10/27/14 148 lb 8 oz (67.359 kg)     Lab Results  Component Value Date   WBC 5.7 05/29/2015   HGB 12.6 05/29/2015   HCT 38.3 05/29/2015   PLT 130.0* 05/29/2015   GLUCOSE 70 05/29/2015   CHOL 141 11/20/2014   TRIG 55.0 11/20/2014   HDL 56.90 11/20/2014   LDLCALC 73 11/20/2014   ALT 24 11/20/2014   AST 28 11/20/2014   NA 140 05/29/2015   K 4.2 05/29/2015   CL 104 05/29/2015   CREATININE 0.68 05/29/2015   BUN 13 05/29/2015   CO2 30 05/29/2015   TSH 1.54 05/29/2015   HGBA1C 6.1 05/29/2015       Assessment & Plan:   Problem List  Items Addressed This Visit    Anemia    Colonoscopy 02/08/14 - normal.  Recheck cbc.        Relevant Orders   CBC with Differential/Platelet   Hemoglobin A1c (Completed)   IBC panel (Completed)   Ferritin (Completed)   Change in bowel function    Bowel changes as outlined.  Fiber, probiotic.  Follow.  Had the one episode of BRB.  Will notify GI.  Colonoscopy 02/08/14 - normal.  Follow.  Chronic insomnia    Persistent problems with sleep.  Seeing neurology.  Using [place] now.  Works for her.  Follow.        Insomnia - Primary   Relevant Orders   TSH (Completed)   Basic metabolic panel (Completed)   Menopausal symptoms    On citalopram and feels this works well for him.  Follow.        Stress    Increased stress as outlined.  Discussed with her today.  She feel she needs something more.  On citalopram.  Will increase to 40mg  q day.  Follow.  Get her back in soon to reassess.        Thrombocytopenia ([place])    Previously worked up by hematology.  Platelet count has been stable.  Recheck cbc today.         Other Visit Diagnoses    Leukopenia            Einar Pheasant, MD

## 2015-05-30 LAB — CBC WITH DIFFERENTIAL/PLATELET
BASOS PCT: 2.7 % (ref 0.0–3.0)
Basophils Absolute: 0.2 10*3/uL — ABNORMAL HIGH (ref 0.0–0.1)
EOS PCT: 2 % (ref 0.0–5.0)
Eosinophils Absolute: 0.1 10*3/uL (ref 0.0–0.7)
HCT: 38.3 % (ref 36.0–46.0)
HEMOGLOBIN: 12.6 g/dL (ref 12.0–15.0)
Lymphocytes Relative: 29.1 % (ref 12.0–46.0)
Lymphs Abs: 1.7 10*3/uL (ref 0.7–4.0)
MCHC: 33 g/dL (ref 30.0–36.0)
MCV: 91.2 fl (ref 78.0–100.0)
MONO ABS: 0.3 10*3/uL (ref 0.1–1.0)
Monocytes Relative: 6.1 % (ref 3.0–12.0)
Neutro Abs: 3.4 10*3/uL (ref 1.4–7.7)
Neutrophils Relative %: 60.1 % (ref 43.0–77.0)
Platelets: 130 10*3/uL — ABNORMAL LOW (ref 150.0–400.0)
RBC: 4.2 Mil/uL (ref 3.87–5.11)
RDW: 14.3 % (ref 11.5–15.5)
WBC: 5.7 10*3/uL (ref 4.0–10.5)

## 2015-05-30 LAB — IBC PANEL
IRON: 113 ug/dL (ref 42–145)
SATURATION RATIOS: 34.5 % (ref 20.0–50.0)
TRANSFERRIN: 234 mg/dL (ref 212.0–360.0)

## 2015-05-30 LAB — BASIC METABOLIC PANEL
BUN: 13 mg/dL (ref 6–23)
CALCIUM: 9.4 mg/dL (ref 8.4–10.5)
CO2: 30 mEq/L (ref 19–32)
Chloride: 104 mEq/L (ref 96–112)
Creatinine, Ser: 0.68 mg/dL (ref 0.40–1.20)
GFR: 94.7 mL/min (ref >60.00)
GLUCOSE: 70 mg/dL (ref 70–99)
Potassium: 4.2 mEq/L (ref 3.5–5.1)
SODIUM: 140 meq/L (ref 135–145)

## 2015-05-30 LAB — TSH: TSH: 1.54 u[IU]/mL (ref 0.35–4.50)

## 2015-05-30 LAB — FERRITIN: Ferritin: 23.6 ng/mL (ref 10.0–291.0)

## 2015-05-30 LAB — HEMOGLOBIN A1C: HEMOGLOBIN A1C: 6.1 % (ref 4.6–6.5)

## 2015-05-31 ENCOUNTER — Encounter: Payer: Self-pay | Admitting: Internal Medicine

## 2015-06-03 ENCOUNTER — Encounter: Payer: Self-pay | Admitting: Internal Medicine

## 2015-06-03 DIAGNOSIS — F439 Reaction to severe stress, unspecified: Secondary | ICD-10-CM | POA: Insufficient documentation

## 2015-06-03 DIAGNOSIS — R198 Other specified symptoms and signs involving the digestive system and abdomen: Secondary | ICD-10-CM | POA: Insufficient documentation

## 2015-06-03 DIAGNOSIS — Z658 Other specified problems related to psychosocial circumstances: Secondary | ICD-10-CM | POA: Insufficient documentation

## 2015-06-03 DIAGNOSIS — R194 Change in bowel habit: Secondary | ICD-10-CM | POA: Insufficient documentation

## 2015-06-03 NOTE — Assessment & Plan Note (Signed)
Increased stress as outlined.  Discussed with her today.  She feel she needs something more.  On citalopram.  Will increase to 40mg  q day.  Follow.  Get her back in soon to reassess.

## 2015-06-03 NOTE — Assessment & Plan Note (Signed)
On citalopram and feels this works well for him.  Follow.

## 2015-06-03 NOTE — Assessment & Plan Note (Signed)
Previously worked up by hematology.  Platelet count has been stable.  Recheck cbc today.

## 2015-06-03 NOTE — Assessment & Plan Note (Signed)
Colonoscopy 02/08/14 - normal.  Recheck cbc.

## 2015-06-03 NOTE — Assessment & Plan Note (Signed)
Bowel changes as outlined.  Fiber, probiotic.  Follow.  Had the one episode of BRB.  Will notify GI.  Colonoscopy 02/08/14 - normal.  Follow.

## 2015-06-03 NOTE — Assessment & Plan Note (Signed)
Persistent problems with sleep.  Seeing neurology.  Using [place] now.  Works for her.  Follow.

## 2015-06-04 NOTE — Telephone Encounter (Signed)
Unread mychart message mailed to patient 

## 2015-06-12 ENCOUNTER — Ambulatory Visit: Payer: 59 | Admitting: Neurology

## 2015-06-12 ENCOUNTER — Other Ambulatory Visit: Payer: Self-pay | Admitting: Neurology

## 2015-06-12 NOTE — Telephone Encounter (Signed)
Has appt scheduled this month

## 2015-06-12 NOTE — Telephone Encounter (Signed)
Rx signed and faxed.

## 2015-06-20 ENCOUNTER — Encounter: Payer: Self-pay | Admitting: Neurology

## 2015-06-20 ENCOUNTER — Ambulatory Visit (INDEPENDENT_AMBULATORY_CARE_PROVIDER_SITE_OTHER): Payer: 59 | Admitting: Neurology

## 2015-06-20 VITALS — BP 92/66 | HR 74 | Resp 20 | Ht 67.0 in | Wt 145.0 lb

## 2015-06-20 DIAGNOSIS — F5101 Primary insomnia: Secondary | ICD-10-CM | POA: Diagnosis not present

## 2015-06-20 MED ORDER — ZALEPLON 5 MG PO CAPS: 5.0000 mg | ORAL_CAPSULE | Freq: Every evening | ORAL | Status: DC | PRN

## 2015-06-20 NOTE — Patient Instructions (Signed)
Zolpidem tablets What is this medicine? ZOLPIDEM (zole PI dem) is used to treat insomnia. This medicine helps you to fall asleep and sleep through the night. This medicine may be used for other purposes; ask your health care provider or pharmacist if you have questions. What should I tell my health care provider before I take this medicine? They need to know if you have any of these conditions: -depression -history of drug abuse or addiction -if you often drink alcohol -liver disease -lung or breathing disease -myasthenia gravis -sleep apnea -suicidal thoughts, plans, or attempt; a previous suicide attempt by you or a family member -an unusual or allergic reaction to zolpidem, other medicines, foods, dyes, or preservatives -pregnant or trying to get pregnant -breast-feeding How should I use this medicine? Take this medicine by mouth with a glass of water. Follow the directions on the prescription label. It is better to take this medicine on an empty stomach and only when you are ready for bed. Do not take your medicine more often than directed. If you have been taking this medicine for several weeks and suddenly stop taking it, you may get unpleasant withdrawal symptoms. Your doctor or health care professional may want to gradually reduce the dose. Do not stop taking this medicine on your own. Always follow your doctor or health care professional's advice. A special MedGuide will be given to you by the pharmacist with each prescription and refill. Be sure to read this information carefully each time. Talk to your pediatrician regarding the use of this medicine in children. Special care may be needed. Overdosage: If you think you have taken too much of this medicine contact a poison control center or emergency room at once. NOTE: This medicine is only for you. Do not share this medicine with others. What if I miss a dose? This does not apply. This medicine should only be taken immediately  before going to sleep. Do not take double or extra doses. What may interact with this medicine? -alcohol -antihistamines for allergy, cough and cold -certain medicines for anxiety or sleep -certain medicines for depression, like amitriptyline, fluoxetine, sertraline -certain medicines for fungal infections like ketoconazole and itraconazole -certain medicines for seizures like phenobarbital, primidone -ciprofloxacin -dietary supplements for sleep, like valerian or kava kava -general anesthetics like halothane, isoflurane, methoxyflurane, propofol -local anesthetics like lidocaine, pramoxine, tetracaine -medicines that relax muscles for surgery -narcotic medicines for pain -phenothiazines like chlorpromazine, mesoridazine, prochlorperazine, thioridazine -rifampin This list may not describe all possible interactions. Give your health care provider a list of all the medicines, herbs, non-prescription drugs, or dietary supplements you use. Also tell them if you smoke, drink alcohol, or use illegal drugs. Some items may interact with your medicine. What should I watch for while using this medicine? Visit your doctor or health care professional for regular checks on your progress. Keep a regular sleep schedule by going to bed at about the same time each night. Avoid caffeine-containing drinks in the evening hours. When sleep medicines are used every night for more than a few weeks, they may stop working. Talk to your doctor if you still have trouble sleeping. After taking this medicine for sleep, you may get up out of bed while not being fully awake and do an activity that you do not know you are doing. The next morning, you may have no memory of the event. Activities such as driving a car ("sleep-driving"), making and eating food, talking on the phone, sexual activity, and sleep-walking have been  reported. Call your doctor right away if you find out you have done any of these activities. Do not take  this medicine if you have used alcohol that evening or before bed or taken another medicine for sleep since your risk of doing these sleep-related activities will be increased. Wait for at least 8 hours after you take a dose before driving or doing other activities that require full mental alertness. Do not take this medicine unless you are able to stay in bed for a full night (7 to 8 hours) before you must be active again. You may have a decrease in mental alertness the day after use, even if you feel that you are fully awake. Tell your doctor if you will need to perform activities requiring full alertness, such as driving, the next day. Do not stand or sit up quickly after taking this medicine, especially if you are an older patient. This reduces the risk of dizzy or fainting spells. If you or your family notice any changes in your behavior, such as new or worsening depression, thoughts of harming yourself, anxiety, other unusual or disturbing thoughts, or memory loss, call your doctor right away. After you stop taking this medicine, you may have trouble falling asleep. This is called rebound insomnia. This problem usually goes away on its own after 1 or 2 nights. What side effects may I notice from receiving this medicine? Side effects that you should report to your doctor or health care professional as soon as possible: -allergic reactions like skin rash, itching or hives, swelling of the face, lips, or tongue -breathing problems -changes in vision -confusion -depressed mood or other changes in moods or emotions -feeling faint or lightheaded, falls -hallucinations -loss of balance or coordination -loss of memory -restlessness, excitability, or feelings of anxiety or agitation -suicidal thoughts -unusual activities while asleep like driving, eating, making phone calls, or sexual activity Side effects that usually do not require medical attention (report to your doctor or health care professional  if they continue or are bothersome): -dizziness -drowsiness the day after you take this medicine -headache This list may not describe all possible side effects. Call your doctor for medical advice about side effects. You may report side effects to FDA at 1-800-FDA-1088. Where should I keep my medicine? Keep out of the reach of children. This medicine can be abused. Keep your medicine in a safe place to protect it from theft. Do not share this medicine with anyone. Selling or giving away this medicine is dangerous and against the law. This medicine may cause accidental overdose and death if taken by other adults, children, or pets. Mix any unused medicine with a substance like cat litter or coffee grounds. Then throw the medicine away in a sealed container like a sealed bag or a coffee can with a lid. Do not use the medicine after the expiration date. Store at room temperature between 20 and 25 degrees C (68 and 77 degrees F). NOTE: This sheet is a summary. It may not cover all possible information. If you have questions about this medicine, talk to your doctor, pharmacist, or health care provider.    2016, Elsevier/Gold Standard. (2015-04-16 17:53:29)
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
## 2015-06-28 LAB — HM MAMMOGRAPHY: HM Mammogram: NEGATIVE

## 2015-07-05 ENCOUNTER — Encounter: Payer: Self-pay | Admitting: Internal Medicine

## 2015-07-30 ENCOUNTER — Other Ambulatory Visit: Payer: Self-pay | Admitting: Neurology

## 2015-07-31 NOTE — Telephone Encounter (Signed)
Rx has been signed and faxed  

## 2015-08-23 ENCOUNTER — Encounter: Payer: Self-pay | Admitting: Internal Medicine

## 2015-08-29 ENCOUNTER — Other Ambulatory Visit: Payer: Self-pay | Admitting: Cardiology

## 2015-08-29 ENCOUNTER — Ambulatory Visit (INDEPENDENT_AMBULATORY_CARE_PROVIDER_SITE_OTHER): Payer: 59 | Admitting: Internal Medicine

## 2015-08-29 ENCOUNTER — Encounter: Payer: Self-pay | Admitting: Internal Medicine

## 2015-08-29 VITALS — BP 90/60 | HR 61 | Temp 98.4°F | Resp 18 | Ht 67.0 in | Wt 146.0 lb

## 2015-08-29 DIAGNOSIS — G4700 Insomnia, unspecified: Secondary | ICD-10-CM | POA: Diagnosis not present

## 2015-08-29 DIAGNOSIS — R0789 Other chest pain: Secondary | ICD-10-CM | POA: Diagnosis not present

## 2015-08-29 DIAGNOSIS — Z658 Other specified problems related to psychosocial circumstances: Secondary | ICD-10-CM

## 2015-08-29 DIAGNOSIS — R0602 Shortness of breath: Secondary | ICD-10-CM

## 2015-08-29 DIAGNOSIS — R079 Chest pain, unspecified: Secondary | ICD-10-CM

## 2015-08-29 DIAGNOSIS — F439 Reaction to severe stress, unspecified: Secondary | ICD-10-CM

## 2015-08-29 DIAGNOSIS — D696 Thrombocytopenia, unspecified: Secondary | ICD-10-CM | POA: Diagnosis not present

## 2015-08-29 NOTE — Progress Notes (Signed)
Patient ID: TIEESHA WOFFORD, female   DOB: 1956/08/14, 60 y.o.   MRN: [id number]   Subjective:    Patient ID: Kimberly Figueroa, female    DOB: 1955/10/26, 60 y.o.   MRN: [id number]  HPI  Patient with past history of endometriosis, ITP and hypersomnia.  She comes in today to follow up on these issues.  She reports she noticed chest pain yesterday.  Located left of sternum.  Deep pain.  Noticed sensation down her left arm.  Described as a pressure.  Constant.  Today notices some pressure, but is better.  No arm sensation.  Occasional sensation with deep breathing.  No sob.  No increased cough or congestion.  No acid reflux.  No abdominal pain or cramping.  Bowels stable.  Sleeping better.  No significant somnolence - daytime.  Seeing neurology.     Past Medical History  Diagnosis Date  . Endometriosis   . History of frequent urinary tract infections     s/p bladder dilatation (11/05)  . ITP (idiopathic thrombocytopenic purpura)   . Pneumonia   . Hypersomnia, recurrent 06/28/2014   Past Surgical History  Procedure Laterality Date  . Tonsillectomy  1961  . Cesarean section  1993  . Vaginal delivery  1987  . Bladder dilatation  11/05  . Laproscopic surgery  10/06    found to have endometriosis  . Vaginal hysterectomy  02/07  . Shoulder surgery Left 2008  . Colonoscopy     Family History  Problem Relation Age of Onset  . Alcohol abuse Mother   . Varicose Veins Mother   . Diabetes Father   . Hearing loss Father   . Heart disease Father   . Crohn's disease Father   . Breast cancer Neg Hx   . Colon cancer Neg Hx   . Crohn's disease Paternal Grandfather    Social History   Social History  . Marital Status: Married    Spouse Name: Gwyndolyn Saxon  . Number of Children: 2  . Years of Education: college   Occupational History  . 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 Smoking status: Never Smoker   . Smokeless tobacco: Never Used  . Alcohol Use: No  .  Drug Use: No  . Sexual Activity: Not Asked   Other Topics Concern  . None   Social History Narrative   Patient consumes 6-8 cups tea daily, is right handed    Outpatient Encounter Prescriptions as of 08/29/2015  Medication Sig  . citalopram (CELEXA) 20 MG tablet Take two tablets daily. (Patient taking differently: Take 30 mg by mouth daily. Take two tablets daily.)  . modafinil (PROVIGIL) 100 MG tablet One a day po AM. (Patient taking differently: as needed. One a day po AM.)  . Multiple Vitamins-Minerals (MULTIVITAMIN GUMMIES ADULTS PO) Take by mouth. VitaCraves  . Progesterone Micronized 10 % CREA Place onto the skin.  . Tavaborole (KERYDIN) 5 % SOLN Apply 1 drop topically 1 day or 1 dose. Apply 1 drop to the toenail daily.  Marland Kitchen zolpidem (AMBIEN) 10 MG tablet Take 0.5-1 tablets (5-10 mg total) by mouth at bedtime as needed (as directed). for sleep (Patient taking differently: Take 5 mg by mouth at bedtime as needed (as directed). for sleep)  . [DISCONTINUED] zaleplon (SONATA) 5 MG capsule Take 1 capsule (5 mg total) by mouth at bedtime as needed for sleep.   No facility-administered encounter medications on file as of 08/29/2015.    Review of  Systems  Constitutional: Negative for appetite change and unexpected weight change.  HENT: Negative for congestion and sinus pressure.   Respiratory: Negative for cough, chest tightness and shortness of breath.   Cardiovascular: Positive for chest pain. Negative for palpitations and leg swelling.  Gastrointestinal: Negative for nausea, vomiting, abdominal pain and diarrhea.  Genitourinary: Negative for dysuria and difficulty urinating.  Musculoskeletal: Negative for back pain and joint swelling.  Skin: Negative for color change and rash.  Neurological: Negative for dizziness, light-headedness and headaches.  Psychiatric/Behavioral: Negative for dysphoric mood and agitation.       Objective:     Blood pressure rechecked by me:   104/68  Physical Exam  Constitutional: She appears well-developed and well-nourished. No distress.  HENT:  Nose: Nose normal.  Mouth/Throat: Oropharynx is clear and moist.  Eyes: Conjunctivae are normal. Right eye exhibits no discharge. Left eye exhibits no discharge.  Neck: Neck supple. No thyromegaly present.  Cardiovascular: Normal rate and regular rhythm.   Pulmonary/Chest: Breath sounds normal. No respiratory distress. She has no wheezes.  No reproducible pain to palpation.    Abdominal: Soft. Bowel sounds are normal. There is no tenderness.  Musculoskeletal: She exhibits no edema or tenderness.  Lymphadenopathy:    She has no cervical adenopathy.  Skin: No rash noted. No erythema.  Psychiatric: She has a normal mood and affect. Her behavior is normal.    BP 90/60 mmHg  Pulse 61  Temp(Src) 98.4 F (36.9 C) (Oral)  Resp 18  Ht 5\' 7"  (1.702 m)  Wt 146 lb (66.225 kg)  BMI 22.86 kg/m2  SpO2 97% Wt Readings from Last 3 Encounters:  08/29/15 146 lb (66.225 kg)  06/20/15 145 lb (65.772 kg)  05/29/15 143 lb 8 oz (65.091 kg)     Lab Results  Component Value Date   WBC 5.7 05/29/2015   HGB 12.6 05/29/2015   HCT 38.3 05/29/2015   PLT 130.0* 05/29/2015   GLUCOSE 70 05/29/2015   CHOL 141 11/20/2014   TRIG 55.0 11/20/2014   HDL 56.90 11/20/2014   LDLCALC 73 11/20/2014   ALT 24 11/20/2014   AST 28 11/20/2014   NA 140 05/29/2015   K 4.2 05/29/2015   CL 104 05/29/2015   CREATININE 0.68 05/29/2015   BUN 13 05/29/2015   CO2 30 05/29/2015   TSH 1.54 05/29/2015   HGBA1C 6.1 05/29/2015       Assessment & Plan:   Problem List Items Addressed This Visit    Chest pain - Primary    Chest pressure as outlined.  EKG revealed SR with no acute ischemic changes.  Discussed with her and discussed with cardiology.  Dr Ubaldo Glassing to see pt today for further evaluation and w/up.   Also discussed checking cxr.      Relevant Orders   EKG 12-Lead (Completed)   DG Chest 2 View    Ambulatory referral to Cardiology   Insomnia    Seeing Dr Lajoyce Lauber.   On ambien.  Sleeping better.  Follow.  Continue f/u with neurology.        Stress    Handling stress relatively well.  Doing better on increased citalopram.  Follow.        Thrombocytopenia ([place])    Worked up by hematology.  Platelet count has been stable.  Follow cbc.           Einar Pheasant, MD

## 2015-08-29 NOTE — Progress Notes (Signed)
Pre-visit discussion using our clinic review tool. No additional management support is needed unless otherwise documented below in the visit note.  

## 2015-09-02 ENCOUNTER — Encounter: Payer: Self-pay | Admitting: Internal Medicine

## 2015-09-02 DIAGNOSIS — R079 Chest pain, unspecified: Secondary | ICD-10-CM | POA: Insufficient documentation

## 2015-09-02 NOTE — Assessment & Plan Note (Signed)
Seeing Dr Lajoyce Lauber.   On ambien.  Sleeping better.  Follow.  Continue f/u with neurology.

## 2015-09-02 NOTE — Assessment & Plan Note (Addendum)
Chest pressure as outlined.  EKG revealed SR with no acute ischemic changes.  Discussed with her and discussed with cardiology.  Dr Ubaldo Glassing to see pt today for further evaluation and w/up.   Also discussed checking cxr.

## 2015-09-02 NOTE — Assessment & Plan Note (Signed)
Handling stress relatively well.  Doing better on increased citalopram.  Follow.

## 2015-09-02 NOTE — Assessment & Plan Note (Signed)
Worked up by hematology.  Platelet count has been stable.  Follow cbc.

## 2015-09-07 ENCOUNTER — Encounter: Payer: Self-pay | Admitting: Internal Medicine

## 2015-09-12 ENCOUNTER — Ambulatory Visit
Admission: RE | Admit: 2015-09-12 | Discharge: 2015-09-12 | Disposition: A | Discharge status: Home / Self Care | Payer: 59 | Source: Ambulatory Visit | Attending: Cardiology | Admitting: Cardiology

## 2015-09-12 DIAGNOSIS — R0689 Other abnormalities of breathing: Secondary | ICD-10-CM | POA: Diagnosis not present

## 2015-09-12 DIAGNOSIS — R0602 Shortness of breath: Secondary | ICD-10-CM | POA: Diagnosis not present

## 2015-09-12 NOTE — Progress Notes (Signed)
*  PRELIMINARY RESULTS* Echocardiogram 2D Echocardiogram has been performed.  Kimberly Figueroa 09/12/2015, 10:51 AM

## 2015-09-14 ENCOUNTER — Ambulatory Visit
Admission: RE | Admit: 2015-09-14 | Discharge: 2015-09-14 | Disposition: A | Discharge status: Home / Self Care | Payer: 59 | Source: Ambulatory Visit | Attending: Cardiology | Admitting: Cardiology

## 2015-09-14 ENCOUNTER — Ambulatory Visit
Admission: RE | Admit: 2015-09-14 | Discharge: 2015-09-14 | Disposition: A | Discharge status: Home / Self Care | Payer: 59 | Source: Ambulatory Visit

## 2015-09-14 DIAGNOSIS — R0789 Other chest pain: Secondary | ICD-10-CM | POA: Diagnosis not present

## 2015-09-14 DIAGNOSIS — I208 Other forms of angina pectoris: Secondary | ICD-10-CM | POA: Diagnosis not present

## 2015-09-17 LAB — EXERCISE TOLERANCE TEST
CHL CUP RESTING HR STRESS: 75 {beats}/min
CHL CUP STRESS STAGE 2 GRADE: 0 %
CHL CUP STRESS STAGE 2 SPEED: 0 mph
CHL CUP STRESS STAGE 3 GRADE: 0 %
CHL CUP STRESS STAGE 3 HR: 81 {beats}/min
CHL CUP STRESS STAGE 4 HR: 81 {beats}/min
CHL CUP STRESS STAGE 5 SBP: 106 mmHg
CHL CUP STRESS STAGE 5 SPEED: 1.7 mph
CHL CUP STRESS STAGE 6 GRADE: 12 %
CHL CUP STRESS STAGE 6 HR: 133 {beats}/min
CHL CUP STRESS STAGE 6 SBP: 111 mmHg
CHL CUP STRESS STAGE 6 SPEED: 2.5 mph
CHL CUP STRESS STAGE 7 GRADE: 14 %
CHL CUP STRESS STAGE 7 HR: 153 {beats}/min
CHL CUP STRESS STAGE 9 DBP: 68 mmHg
CHL CUP STRESS STAGE 9 GRADE: 0 %
CHL CUP STRESS STAGE 9 HR: 87 {beats}/min
CHL CUP STRESS STAGE 9 SPEED: 0 mph
CSEPHR: 93 %
CSEPPMHR: 93 %
Estimated workload: 9.3 METS
Exercise duration (min): 7 min
Peak HR: 153 {beats}/min
Stage 1 HR: 75 {beats}/min
Stage 2 HR: 75 {beats}/min
Stage 3 Speed: 0.1 mph
Stage 4 Grade: 0.2 %
Stage 4 Speed: 0.2 mph
Stage 5 DBP: 45 mmHg
Stage 5 Grade: 10 %
Stage 5 HR: 116 {beats}/min
Stage 6 DBP: 51 mmHg
Stage 7 Speed: 3.4 mph
Stage 8 Grade: 0 %
Stage 8 HR: 121 {beats}/min
Stage 8 Speed: 0 mph
Stage 9 SBP: 110 mmHg

## 2015-09-27 ENCOUNTER — Ambulatory Visit: Payer: Self-pay | Admitting: Internal Medicine

## 2015-10-05 ENCOUNTER — Encounter: Payer: Self-pay | Admitting: Internal Medicine

## 2015-10-08 NOTE — Telephone Encounter (Signed)
I am not aware of altitude sickness worsening with ITP.  Will get hematology thoughts.

## 2015-10-24 ENCOUNTER — Other Ambulatory Visit: Payer: Self-pay | Admitting: Neurology

## 2015-10-24 NOTE — Telephone Encounter (Signed)
Faxed RX for provigil to [place]. Received a receipt of confirmation.

## 2015-10-25 ENCOUNTER — Encounter: Payer: Self-pay | Admitting: Internal Medicine

## 2015-10-25 ENCOUNTER — Other Ambulatory Visit: Payer: Self-pay | Admitting: Internal Medicine

## 2015-10-25 NOTE — Telephone Encounter (Signed)
Historical medication. Please advise? 

## 2015-10-25 NOTE — Telephone Encounter (Signed)
Please call and confirm with pt how she is using and who has been prescribing previously.  We are not listed in computer.

## 2015-10-29 NOTE — Telephone Encounter (Signed)
Historical medication. Please advise? 

## 2015-10-30 NOTE — Telephone Encounter (Signed)
See 10/25/15 refill message.  Please call and confirm with pt how she is using the medication and who has been prescribing previously.  We are not listed in computer as refilling.

## 2015-10-31 NOTE — Telephone Encounter (Signed)
Please advise, thanks.

## 2015-10-31 NOTE — Telephone Encounter (Signed)
Pharmacy faxing over the formulation of medication for Dr.Scott to sign.

## 2015-10-31 NOTE — Telephone Encounter (Signed)
Voicemail was left for patient to callback. Read message from [facility] about prescription. Patient may callback.

## 2015-11-01 ENCOUNTER — Encounter: Payer: Self-pay | Admitting: Internal Medicine

## 2015-11-01 ENCOUNTER — Ambulatory Visit (INDEPENDENT_AMBULATORY_CARE_PROVIDER_SITE_OTHER): Payer: 59 | Admitting: Internal Medicine

## 2015-11-01 VITALS — BP 98/64 | HR 62 | Temp 98.1°F | Ht 66.25 in | Wt 142.2 lb

## 2015-11-01 DIAGNOSIS — D696 Thrombocytopenia, unspecified: Secondary | ICD-10-CM | POA: Diagnosis not present

## 2015-11-01 DIAGNOSIS — Z7184 Encounter for health counseling related to travel: Secondary | ICD-10-CM

## 2015-11-01 DIAGNOSIS — Z7189 Other specified counseling: Secondary | ICD-10-CM | POA: Diagnosis not present

## 2015-11-01 DIAGNOSIS — F439 Reaction to severe stress, unspecified: Secondary | ICD-10-CM

## 2015-11-01 DIAGNOSIS — Z658 Other specified problems related to psychosocial circumstances: Secondary | ICD-10-CM

## 2015-11-01 DIAGNOSIS — G4700 Insomnia, unspecified: Secondary | ICD-10-CM

## 2015-11-01 NOTE — Progress Notes (Signed)
Pre visit review using our clinic review tool, if applicable. No additional management support is needed unless otherwise documented below in the visit note. 

## 2015-11-01 NOTE — Telephone Encounter (Signed)
Have you seen this form?

## 2015-11-01 NOTE — Progress Notes (Signed)
Patient ID: GRANT MAZZOLA, female   DOB: [birth year], 58 y.o.   MRN: [id number]   Subjective:    Patient ID: EMMELIE CHMELIK, female    DOB: 12/13/1955, 60 y.o.   MRN: [id number]  HPI  Patient with past history of ITP and hypersomnia.  She comes in today for a scheduled follow up.  She has been worked up by neurology for her sleep.  See notes.  Takes Lorrin Mais now.  Works for her.  Energy better.  Stays active.  No cardiac symptoms with increased activity or exertion.  No sob.  No acid reflux reported.  No abdominal pain or cramping.  Bowels stable.  She is planning to travel to [place] and [place].  Had questions about altitude sickness.  Request acetazolamide.  Discussed at length with her today.  After discussion, it was decided to discuss with travel MD - Dr Elaina Pattee.  Handling stress relatively well.     Past Medical History  Diagnosis Date  . Endometriosis   . History of frequent urinary tract infections     s/p bladder dilatation (11/05)  . ITP (idiopathic thrombocytopenic purpura)   . Pneumonia   . Hypersomnia, recurrent 06/28/2014   Past Surgical History  Procedure Laterality Date  . Tonsillectomy  1961  . Cesarean section  1993  . Vaginal delivery  1987  . Bladder dilatation  11/05  . Laproscopic surgery  10/06    found to have endometriosis  . Vaginal hysterectomy  02/07  . Shoulder surgery Left 2008  . Colonoscopy     Family History  Problem Relation Age of Onset  . Alcohol abuse Mother   . Varicose Veins Mother   . Diabetes Father   . Hearing loss Father   . Heart disease Father   . Crohn's disease Father   . Breast cancer Neg Hx   . Colon cancer Neg Hx   . Crohn's disease Paternal Grandfather    Social History   Social History  . Marital Status: Married    Spouse Name: Gwyndolyn Saxon  . Number of Children: 2  . Years of Education: college   Occupational History  . 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 Smoking status: Never  Smoker   . Smokeless tobacco: Never Used  . Alcohol Use: No  . Drug Use: No  . Sexual Activity: Not Asked   Other Topics Concern  . None   Social History Narrative   Patient consumes 6-8 cups tea daily, is right handed    Outpatient Encounter Prescriptions as of 11/01/2015  Medication Sig  . modafinil (PROVIGIL) 100 MG tablet TAKE 1 TABLET BY MOUTH ONCE DAILY IN THE MORNING (Patient taking differently: TAKE 1 TABLET BY MOUTH PRN)  . Multiple Vitamins-Minerals (MULTIVITAMIN GUMMIES ADULTS PO) Take by mouth. VitaCraves  . Progesterone Micronized 10 % CREA Place onto the skin.  Marland Kitchen zolpidem (AMBIEN) 10 MG tablet Take 0.5-1 tablets (5-10 mg total) by mouth at bedtime as needed (as directed). for sleep (Patient taking differently: Take 5 mg by mouth at bedtime as needed (as directed). for sleep)  . [DISCONTINUED] citalopram (CELEXA) 20 MG tablet Take two tablets daily. (Patient taking differently: Take 30 mg by mouth daily. Take two tablets daily.)  . Tavaborole (KERYDIN) 5 % SOLN Apply 1 drop topically 1 day or 1 dose. Apply 1 drop to the toenail daily. (Patient not taking: Reported on 11/01/2015)   No facility-administered encounter medications on file as of  11/01/2015.    Review of Systems  Constitutional: Negative for appetite change and unexpected weight change.  HENT: Negative for congestion and sinus pressure.   Respiratory: Negative for cough, chest tightness and shortness of breath.   Cardiovascular: Negative for chest pain, palpitations and leg swelling.  Gastrointestinal: Negative for nausea, vomiting, abdominal pain and diarrhea.  Genitourinary: Negative for dysuria and difficulty urinating.  Musculoskeletal: Negative for back pain and joint swelling.  Skin: Negative for color change and rash.  Neurological: Negative for dizziness, light-headedness and headaches.  Psychiatric/Behavioral: Negative for dysphoric mood and agitation.       Objective:     Blood pressure rechecked  by me:  108/68  Physical Exam  Constitutional: She appears well-developed and well-nourished. No distress.  HENT:  Nose: Nose normal.  Mouth/Throat: Oropharynx is clear and moist.  Neck: Neck supple. No thyromegaly present.  Cardiovascular: Normal rate and regular rhythm.   Pulmonary/Chest: Breath sounds normal. No respiratory distress. She has no wheezes.  Abdominal: Soft. Bowel sounds are normal. There is no tenderness.  Musculoskeletal: She exhibits no edema or tenderness.  Lymphadenopathy:    She has no cervical adenopathy.  Skin: No rash noted. No erythema.  Psychiatric: She has a normal mood and affect. Her behavior is normal.    BP 98/64 mmHg  Pulse 62  Temp(Src) 98.1 F (36.7 C) (Oral)  Ht 5' 6.25" (1.683 m)  Wt 142 lb 4 oz (64.524 kg)  BMI 22.78 kg/m2  SpO2 94% Wt Readings from Last 3 Encounters:  11/01/15 142 lb 4 oz (64.524 kg)  08/29/15 146 lb (66.225 kg)  06/20/15 145 lb (65.772 kg)     Lab Results  Component Value Date   WBC 5.7 05/29/2015   HGB 12.6 05/29/2015   HCT 38.3 05/29/2015   PLT 130.0* 05/29/2015   GLUCOSE 70 05/29/2015   CHOL 141 11/20/2014   TRIG 55.0 11/20/2014   HDL 56.90 11/20/2014   LDLCALC 73 11/20/2014   ALT 24 11/20/2014   AST 28 11/20/2014   NA 140 05/29/2015   K 4.2 05/29/2015   CL 104 05/29/2015   CREATININE 0.68 05/29/2015   BUN 13 05/29/2015   CO2 30 05/29/2015   TSH 1.54 05/29/2015   HGBA1C 6.1 05/29/2015       Assessment & Plan:   Problem List Items Addressed This Visit    Insomnia    Worked up by neurology.  On ambien.  Follow.  Working well for her.        Stress    Feels she is handling stress relatively well.  Follow.        Thrombocytopenia ([place]) - Primary    Has ITP.  Worked up by hematology.  Follow cbc.       Travel advice encounter    Discussed with her at length today.  Discussed altitude sickness.  She request acetazolamide.  Discussed use of medication.  Will discuss with travel doctor.     Addendum:  Discussed with Dr Geoffry Paradise.  rx sent inf ro acetazolamide.  Discussed possible side effects.  Dr Geoffry Paradise will send info for pt and then allow her to decide on taking.            Einar Pheasant, MD

## 2015-11-02 ENCOUNTER — Telehealth: Payer: Self-pay | Admitting: Internal Medicine

## 2015-11-02 MED ORDER — ACETAZOLAMIDE 125 MG PO TABS: ORAL_TABLET | ORAL | Status: DC

## 2015-11-02 NOTE — Telephone Encounter (Signed)
rx sent in for acetazolamide.

## 2015-11-02 NOTE — Telephone Encounter (Signed)
Rx was phoned in to pharmacy on 11/01/15. And pt notified while in office for appt.

## 2015-11-04 MED ORDER — CITALOPRAM HYDROBROMIDE 20 MG PO TABS: ORAL_TABLET | ORAL | Status: DC

## 2015-11-04 NOTE — Addendum Note (Signed)
Addended by: Alisa Graff on: 11/04/2015 11:14 PM   Modules accepted: Orders

## 2015-11-04 NOTE — Telephone Encounter (Signed)
rx sent in for celexa #45 with 3 refills.

## 2015-11-05 ENCOUNTER — Encounter: Payer: Self-pay | Admitting: Internal Medicine

## 2015-11-05 DIAGNOSIS — Z7184 Encounter for health counseling related to travel: Secondary | ICD-10-CM | POA: Insufficient documentation

## 2015-11-05 NOTE — Assessment & Plan Note (Signed)
Worked up by neurology.  On ambien.  Follow.  Working well for her.

## 2015-11-05 NOTE — Assessment & Plan Note (Signed)
Discussed with her at length today.  Discussed altitude sickness.  She request acetazolamide.  Discussed use of medication.  Will discuss with travel doctor.    Addendum:  Discussed with Dr Geoffry Paradise.  rx sent inf ro acetazolamide.  Discussed possible side effects.  Dr Geoffry Paradise will send info for pt and then allow her to decide on taking.

## 2015-11-05 NOTE — Assessment & Plan Note (Signed)
Feels she is handling stress relatively well.  Follow.

## 2015-11-05 NOTE — Assessment & Plan Note (Signed)
Has ITP.  Worked up by hematology.  Follow cbc.   

## 2015-12-12 ENCOUNTER — Encounter: Payer: Self-pay | Admitting: Internal Medicine

## 2015-12-14 ENCOUNTER — Telehealth: Payer: Self-pay | Admitting: Internal Medicine

## 2015-12-14 MED ORDER — EPINEPHRINE 0.3 MG/0.3ML IJ SOAJ: 0.3000 mg | Freq: Once | INTRAMUSCULAR | Status: DC

## 2015-12-14 NOTE — Telephone Encounter (Signed)
rx ok'd for epipen 2 pack with one refill.

## 2015-12-14 NOTE — Telephone Encounter (Signed)
Patient stated that her epi pen was expired but there is no rx on her chart to send has refill. please advise?

## 2016-02-03 ENCOUNTER — Telehealth: Payer: 59 | Admitting: Family

## 2016-02-03 DIAGNOSIS — T7840XA Allergy, unspecified, initial encounter: Secondary | ICD-10-CM | POA: Diagnosis not present

## 2016-02-03 MED ORDER — PREDNISONE 10 MG (21) PO TBPK: 10.0000 mg | ORAL_TABLET | Freq: Every day | ORAL | Status: DC

## 2016-02-03 NOTE — Progress Notes (Signed)
E visit for Allergic Rhinitis We are sorry that you are not feeling well.  Her is how we plan to help!  Based on what you have shared with me it looks like you have Allergic Rhinitis.  Rhinitis is when a reaction occurs that causes nasal congestion, runny nose, sneezing, and itching.  Most types of rhinitis are caused by an inflammation and are associated with symptoms in the eyes ears or throat. There are several types of rhinitis.  The most common are acute rhinitis, which is usually caused by a viral illness, allergic or seasonal rhinitis, and nonallergic or year-round rhinitis.  Nasal allergies occur certain times of the year.  Allergic rhinitis is caused when allergens in the air trigger the release of histamine in the body.  Histamine causes itching, swelling, and fluid to build up in the fragile linings of the nasal passages, sinuses and eyelids.  An itchy nose and clear discharge are common.  I recommend the following over the counter treatments: Xyzal 5 mg take 1 tablet daily  You may also benefit from eye drops such as: Systane 1-2 driops each eye twice daily as needed   It looks as if you had an allergic reaction to something. I sent in a prednisone dose pack. Continue benadryl and if the swelling gets worse or you have any trouble breathing you need to go to the ED!!!    HOME CARE:   You can use an over-the-counter saline nasal spray as needed  Avoid areas where there is heavy dust, mites, or molds  Stay indoors on windy days during the pollen season  Keep windows closed in home, at least in bedroom; use air conditioner.  Use high-efficiency house air filter  Keep windows closed in car, turn AC on re-circulate  Avoid playing out with dog during pollen season  GET HELP RIGHT AWAY IF:   If your symptoms do not improve within 10 days  You become short of breath  You develop yellow or green discharge from your nose for over 3 days  You have coughing fits  MAKE SURE  YOU:   Understand these instructions  Will watch your condition  Will get help right away if you are not doing well or get worse  Thank you for choosing an e-visit. Your e-visit answers were reviewed by a board certified advanced clinical practitioner to complete your personal care plan. Depending upon the condition, your plan could have included both over the counter or prescription medications. Please review your pharmacy choice. Be sure that the pharmacy you have chosen is open so that you can pick up your prescription now.  If there is a problem you may message your provider in [place] to have the prescription routed to another pharmacy. Your safety is important to [place]. If you have drug allergies check your prescription carefully.  For the next 24 hours, you can use MyChart to ask questions about today's visit, request a non-urgent call back, or ask for a work or school excuse from your e-visit provider. You will get an email in the next two days asking about your experience. I hope that your e-visit has been valuable and will speed your recovery.

## 2016-02-14 ENCOUNTER — Other Ambulatory Visit: Payer: Self-pay | Admitting: Neurology

## 2016-02-14 NOTE — Telephone Encounter (Signed)
RX for [employer] faxed to [facility]. Received a receipt of confirmation.

## 2016-03-11 ENCOUNTER — Other Ambulatory Visit: Payer: Self-pay | Admitting: Internal Medicine

## 2016-05-05 ENCOUNTER — Ambulatory Visit: Payer: Self-pay | Admitting: Internal Medicine

## 2016-06-25 DIAGNOSIS — R600 Localized edema: Secondary | ICD-10-CM | POA: Diagnosis not present

## 2016-07-14 ENCOUNTER — Other Ambulatory Visit: Payer: Self-pay | Admitting: Internal Medicine

## 2016-07-16 DIAGNOSIS — R600 Localized edema: Secondary | ICD-10-CM | POA: Diagnosis not present

## 2016-07-21 DIAGNOSIS — H2513 Age-related nuclear cataract, bilateral: Secondary | ICD-10-CM | POA: Diagnosis not present

## 2016-07-24 ENCOUNTER — Ambulatory Visit: Payer: Self-pay | Admitting: Family

## 2016-07-25 ENCOUNTER — Encounter: Payer: Self-pay | Admitting: Family Medicine

## 2016-07-25 ENCOUNTER — Ambulatory Visit (INDEPENDENT_AMBULATORY_CARE_PROVIDER_SITE_OTHER): Payer: 59 | Admitting: Family Medicine

## 2016-07-25 VITALS — BP 88/51 | HR 72 | Temp 98.0°F | Resp 12 | Wt 148.1 lb

## 2016-07-25 DIAGNOSIS — B839 Helminthiasis, unspecified: Secondary | ICD-10-CM

## 2016-07-25 NOTE — Progress Notes (Signed)
Pre visit review using our clinic review tool, if applicable. No additional management support is needed unless otherwise documented below in the visit note. 

## 2016-07-25 NOTE — Assessment & Plan Note (Signed)
New problem. Patient suspects that she had a worm in her stool. No objective evidence available to me.  Obtaining stool O&P. Offered empiric treatment. Patient elected to wait.

## 2016-07-25 NOTE — Patient Instructions (Addendum)
We will call with the result once it is brought in and processed.  If you see any other evidence please take a photo for [place].  Take care  Dr. Lacinda Axon

## 2016-07-25 NOTE — Progress Notes (Signed)
Subjective:  Patient ID: Kimberly Figueroa, female    DOB: Nov 29, 1955  Age: 60 y.o. MRN: [id number]  CC: Worm in stool  HPI:  60 year old female presents with the above complaint.  Patient states that over the summer she felt like she noticed a small worm in her stool. She states that she did not think any more about this. She subsequently has had constipation and mucus in her stool. This past Wednesday she had a bowel movement in a public restroom and noted a long white "rope" in her stool. She is convinced that this was a worm. She denies any anal or perineal itching. She has traveled out of the country last year. She has traveled several times this year but within the [place]. She has been to the [place] several times. She reports that she is aware of her abdomen particularly after bowel movements but denies any discrete abdominal pain. She states that she generally does not feel well. She sleeps poorly and this is a chronic issue. She has been reading on line is concerned that this has been concerning to her insomnia and has been going on for quite some time. No blood in the stool.   Social Hx   Social History   Social History  . Marital status: Married    Spouse name: Gwyndolyn Saxon  . Number of children: 2  . 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 Smokeless tobacco: Never Used  . Alcohol use No  . Drug use: No  . Sexual activity: Not Asked   Other Topics Concern  . None   Social History Narrative   Patient consumes 6-8 cups tea daily, is right handed    Review of Systems  Constitutional: Negative.   Gastrointestinal: Positive for constipation. Negative for blood in stool.       ? Worm in stool.    Objective:  BP (!) 88/51 (BP Location: Left Arm, Patient Position: Sitting, Cuff Size: Normal)   Pulse 72   Temp 98 F (36.7 C) (Oral)   Resp 12   Wt 148 lb 2 oz  (67.2 kg)   SpO2 98%   BMI 23.73 kg/m   BP/Weight 07/25/2016 [id number]  Systolic BP 88 98 90  Diastolic BP 51 64 60  Wt. (Lbs) 148.13 142.25 146  BMI 23.73 22.78 22.86   Physical Exam  Constitutional: She is oriented to person, place, and time. She appears well-developed. No distress.  Cardiovascular: Normal rate and regular rhythm.   Pulmonary/Chest: Effort normal and breath sounds normal.  Abdominal: Soft. She exhibits no distension. There is no tenderness. There is no rebound and no guarding.  Neurological: She is alert and oriented to person, place, and time.  Vitals reviewed.  Lab Results  Component Value Date   WBC 5.7 05/29/2015   HGB 12.6 05/29/2015   HCT 38.3 05/29/2015   PLT 130.0 (L) 05/29/2015   GLUCOSE 70 05/29/2015   CHOL 141 11/20/2014   TRIG 55.0 11/20/2014   HDL 56.90 11/20/2014   LDLCALC 73 11/20/2014   ALT 24 11/20/2014   AST 28 11/20/2014   NA 140 05/29/2015   K 4.2 05/29/2015   CL 104 05/29/2015   CREATININE 0.68 05/29/2015   BUN 13 05/29/2015   CO2 30 05/29/2015   TSH 1.54 05/29/2015   HGBA1C 6.1 05/29/2015    Assessment & Plan:  Problem List Items Addressed This Visit    Worms in stool - Primary    New problem. Patient suspects that she had a worm in her stool. No objective evidence available to me.  Obtaining stool O&P. Offered empiric treatment. Patient elected to wait.       Relevant Orders   Ova and parasite examination     Follow-up: PRN  [place]

## 2016-08-06 DIAGNOSIS — I788 Other diseases of capillaries: Secondary | ICD-10-CM | POA: Diagnosis not present

## 2016-08-13 ENCOUNTER — Other Ambulatory Visit: Payer: Self-pay | Admitting: Internal Medicine

## 2016-08-19 ENCOUNTER — Other Ambulatory Visit: Payer: Self-pay | Admitting: Neurology

## 2016-08-20 ENCOUNTER — Encounter: Payer: Self-pay | Admitting: Internal Medicine

## 2016-08-21 MED ORDER — ZOLPIDEM TARTRATE 10 MG PO TABS: ORAL_TABLET | ORAL | 1 refills | Status: DC

## 2016-08-21 NOTE — Telephone Encounter (Signed)
rx ok'd for ambien #30 with one refill.   Pt notified via my chart.

## 2016-08-28 ENCOUNTER — Ambulatory Visit: Payer: Self-pay | Admitting: Physician Assistant

## 2016-08-28 VITALS — BP 100/60 | HR 73 | Temp 98.0°F

## 2016-08-28 DIAGNOSIS — R21 Rash and other nonspecific skin eruption: Secondary | ICD-10-CM

## 2016-08-28 MED ORDER — DEXAMETHASONE SODIUM PHOSPHATE 10 MG/ML IJ SOLN
10.0000 mg | Freq: Once | INTRAMUSCULAR | Status: AC
  Administered 2016-08-28: 10 mg via INTRAMUSCULAR

## 2016-08-28 MED ORDER — METHYLPREDNISOLONE 4 MG PO TBPK: ORAL_TABLET | ORAL | 0 refills | Status: DC

## 2016-08-28 NOTE — Progress Notes (Signed)
S: c/o rash on side of face and swelling under eyes, some rash under the eyes, happened about 6 months ago and went away, now it has returned, ?if allergic reaction but has no idea what she might be exposed to, no fever/chills/cough, no oral or vaginal lesions, no new exposures  O: vitals wnl, nad, + edema in orbital areas, pinkish rash like shiners under both eyes, small bumps around mouth, some with white heads, no vesicles or honey crusted lesions, mouth is free of lesions, neck supple no lymph, lungs c t a, cv rrr  A: rash  P: decadron 10mg  Im, medrol dose pack if not clearing in 3 days; f/u with pcp and consider soft tissue disorder ; ie lupus

## 2016-08-29 ENCOUNTER — Telehealth: Payer: Self-pay | Admitting: *Deleted

## 2016-08-29 NOTE — Telephone Encounter (Signed)
Patient was advised by employee health to schedule an appt with her PCP to discuss possible allergies  Please Schedule -Thanks  Contact [phone]

## 2016-08-29 NOTE — Telephone Encounter (Signed)
Lm for pt to call back and schedule.

## 2016-09-02 ENCOUNTER — Ambulatory Visit: Payer: Self-pay | Admitting: Internal Medicine

## 2016-09-04 ENCOUNTER — Ambulatory Visit: Payer: Self-pay | Admitting: Internal Medicine

## 2016-09-10 ENCOUNTER — Ambulatory Visit: Payer: Self-pay | Admitting: Internal Medicine

## 2016-09-12 ENCOUNTER — Ambulatory Visit (INDEPENDENT_AMBULATORY_CARE_PROVIDER_SITE_OTHER): Payer: 59 | Admitting: Internal Medicine

## 2016-09-12 VITALS — BP 110/60 | HR 65 | Temp 98.1°F | Ht 66.25 in | Wt 148.8 lb

## 2016-09-12 DIAGNOSIS — R21 Rash and other nonspecific skin eruption: Secondary | ICD-10-CM | POA: Diagnosis not present

## 2016-09-12 DIAGNOSIS — D696 Thrombocytopenia, unspecified: Secondary | ICD-10-CM

## 2016-09-12 DIAGNOSIS — G4700 Insomnia, unspecified: Secondary | ICD-10-CM | POA: Diagnosis not present

## 2016-09-12 DIAGNOSIS — Z13220 Encounter for screening for lipoid disorders: Secondary | ICD-10-CM | POA: Diagnosis not present

## 2016-09-12 NOTE — Progress Notes (Signed)
Pre visit review using our clinic review tool, if applicable. No additional management support is needed unless otherwise documented below in the visit note. 

## 2016-09-12 NOTE — Progress Notes (Signed)
Patient ID: Kimberly Figueroa, female   DOB: May 14, 1956, 61 y.o.   MRN: [id number]   Subjective:    Patient ID: Kimberly Figueroa, female    DOB: 04-01-1956, 61 y.o.   MRN: [id number]  HPI  Patient here as a work in with concerns regarding persistent intermittent rash and eye swelling.  [place] around her lips - extending from the corners and her eyes were swelling.  This resolved.  In 03/2017 - eyes swollen.  Recently has had eye swelling and rash around her lips.  Also noticed rash on her hip and bilateral breasts - lateral to breasts.  Itching.  Took benadryl.  Was seen by the acute office at her work.  Was placed on prednisone.  No swelling now.  Rash is better.  No fever.  No lip swelling.  No tongue swelling and no sob.  The PA that saw her raised concern over possible CTD.  She is here for further evaluation.     Past Medical History:  Diagnosis Date  . Endometriosis   . History of frequent urinary tract infections    s/p bladder dilatation (11/05)  . Hypersomnia, recurrent 06/28/2014  . ITP (idiopathic thrombocytopenic purpura)   . Pneumonia    Past Surgical History:  Procedure Laterality Date  . bladder dilatation  11/05  . CESAREAN SECTION  1993  . COLONOSCOPY    . laproscopic surgery  10/06   found to have endometriosis  . SHOULDER SURGERY Left 2008  . TONSILLECTOMY  1961  . VAGINAL DELIVERY  1987  . VAGINAL HYSTERECTOMY  02/07   Family History  Problem Relation Age of Onset  . Alcohol abuse Mother   . Varicose Veins Mother   . Diabetes Father   . Hearing loss Father   . Heart disease Father   . Crohn's disease Father   . Breast cancer Neg Hx   . Colon cancer Neg Hx   . Crohn's disease Paternal Grandfather    Social History   Social History  . Marital status: Married    Spouse name: Gwyndolyn Saxon  . Number of children: 2  . Years of education: college   Occupational History  . 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 Smokeless tobacco: Never Used  . Alcohol use No  . Drug use: No  . Sexual activity: Not on file   Other Topics Concern  . Not on file   Social History Narrative   Patient consumes 6-8 cups tea daily, is right handed    Outpatient Encounter Prescriptions as of 09/12/2016  Medication Sig  . acetaZOLAMIDE (DIAMOX) 125 MG tablet Take one po bid 1 day before ascending.  Can stop after at same altitude for two days.  . citalopram (CELEXA) 20 MG tablet TAKE 1 & 1/2 TABLETS BY MOUTH DAILY  . EPINEPHrine (EPIPEN 2-PAK) 0.3 mg/0.3 mL IJ SOAJ injection Inject 0.3 mLs (0.3 mg total) into the muscle once.  . methylPREDNISolone (MEDROL DOSEPAK) 4 MG TBPK tablet Take 6 pills on day one then decrease by 1 pill each day  . modafinil (PROVIGIL) 100 MG tablet TAKE 1 TABLET BY MOUTH ONCE DAILY IN THE MORNING (Patient taking differently: TAKE 1 TABLET BY MOUTH PRN)  . Multiple Vitamins-Minerals (MULTIVITAMIN GUMMIES ADULTS PO) Take by mouth. VitaCraves  . Progesterone Micronized 10 % CREA Place onto the skin.  Corrie Dandy (  KERYDIN) 5 % SOLN Apply 1 drop topically 1 day or 1 dose. Apply 1 drop to the toenail daily.  Marland Kitchen zolpidem (AMBIEN) 10 MG tablet TAKE 1/2 TO 1 TABLET BY MOUTH AT BEDTIME AS NEEDED (AS DIRECTED) FOR SLEEP  . [DISCONTINUED] predniSONE (STERAPRED UNI-PAK 21 TAB) 10 MG (21) TBPK tablet Take 1 tablet (10 mg total) by mouth daily. As directed x 6 days   No facility-administered encounter medications on file as of 09/12/2016.     Review of Systems  Constitutional: Negative for appetite change and unexpected weight change.  HENT: Negative for congestion and sinus pressure.   Respiratory: Negative for cough, chest tightness and shortness of breath.   Cardiovascular: Negative for chest pain, palpitations and leg swelling.  Gastrointestinal: Negative for abdominal pain, diarrhea, nausea and vomiting.  Musculoskeletal:  Negative for joint swelling and myalgias.  Skin: Positive for rash.       Eye swelling as outlined.    Neurological: Negative for dizziness, light-headedness and headaches.  Psychiatric/Behavioral: Negative for agitation and dysphoric mood.       Objective:    Physical Exam  Constitutional: She appears well-developed and well-nourished. No distress.  HENT:  Nose: Nose normal.  Mouth/Throat: Oropharynx is clear and moist.  Neck: Neck supple. No thyromegaly present.  Cardiovascular: Normal rate and regular rhythm.   Pulmonary/Chest: Breath sounds normal. No respiratory distress. She has no wheezes.  Abdominal: Soft. Bowel sounds are normal. There is no tenderness.  Musculoskeletal: She exhibits no edema or tenderness.  Lymphadenopathy:    She has no cervical adenopathy.  Skin:  No rash - hip.  No rash - breasts.  Rash clearing - around her mouth.  No swelling of her eyes.    Psychiatric: She has a normal mood and affect. Her behavior is normal.    BP 110/60   Pulse 65   Temp 98.1 F (36.7 C) (Oral)   Ht 5' 6.25" (1.683 m)   Wt 148 lb 12.8 oz (67.5 kg)   SpO2 97%   BMI 23.84 kg/m  Wt Readings from Last 3 Encounters:  09/12/16 148 lb 12.8 oz (67.5 kg)  07/25/16 148 lb 2 oz (67.2 kg)  11/01/15 142 lb 4 oz (64.5 kg)     Lab Results  Component Value Date   WBC 5.7 05/29/2015   HGB 12.6 05/29/2015   HCT 38.3 05/29/2015   PLT 130.0 (L) 05/29/2015   GLUCOSE 70 05/29/2015   CHOL 141 11/20/2014   TRIG 55.0 11/20/2014   HDL 56.90 11/20/2014   LDLCALC 73 11/20/2014   ALT 24 11/20/2014   AST 28 11/20/2014   NA 140 05/29/2015   K 4.2 05/29/2015   CL 104 05/29/2015   CREATININE 0.68 05/29/2015   BUN 13 05/29/2015   CO2 30 05/29/2015   TSH 1.54 05/29/2015   HGBA1C 6.1 05/29/2015       Assessment & Plan:   Problem List Items Addressed This Visit    Insomnia    Worked up by neurology.  On ambien.  Follow.  Stable.       Rash    Unclear etiology.  Reaction  appears to be more allergic.  No new triggers.  No new exposures.  She has tried to find possible trigger.  Refer to allergist.  Check routine labs including rheum panel.        Relevant Orders   Comprehensive metabolic panel   Rheumatoid factor   Sedimentation rate   ANA   C-reactive protein  Thrombocytopenia (HCC)    Has ITP.  Worked up by hematology.  Recheck cbc.       Relevant Orders   CBC with Differential/Platelet   TSH    Other Visit Diagnoses    Screening cholesterol level    -  Primary   Relevant Orders   Lipid panel       Einar Pheasant, MD

## 2016-09-14 ENCOUNTER — Encounter: Payer: Self-pay | Admitting: Internal Medicine

## 2016-09-14 DIAGNOSIS — R21 Rash and other nonspecific skin eruption: Secondary | ICD-10-CM | POA: Insufficient documentation

## 2016-09-14 NOTE — Assessment & Plan Note (Signed)
Has ITP. Worked up by hematology.  Recheck cbc.   

## 2016-09-14 NOTE — Assessment & Plan Note (Signed)
Unclear etiology.  Reaction appears to be more allergic.  No new triggers.  No new exposures.  She has tried to find possible trigger.  Refer to allergist.  Check routine labs including rheum panel.

## 2016-09-14 NOTE — Assessment & Plan Note (Signed)
Worked up by neurology.  On ambien.  Follow.  Stable.

## 2016-09-18 ENCOUNTER — Telehealth: Payer: Self-pay | Admitting: *Deleted

## 2016-09-18 ENCOUNTER — Other Ambulatory Visit (INDEPENDENT_AMBULATORY_CARE_PROVIDER_SITE_OTHER): Payer: 59

## 2016-09-18 DIAGNOSIS — D696 Thrombocytopenia, unspecified: Secondary | ICD-10-CM | POA: Diagnosis not present

## 2016-09-18 DIAGNOSIS — R21 Rash and other nonspecific skin eruption: Secondary | ICD-10-CM | POA: Diagnosis not present

## 2016-09-18 DIAGNOSIS — Z13220 Encounter for screening for lipoid disorders: Secondary | ICD-10-CM

## 2016-09-18 DIAGNOSIS — H5789 Other specified disorders of eye and adnexa: Secondary | ICD-10-CM

## 2016-09-18 LAB — TSH: TSH: 1.65 u[IU]/mL (ref 0.35–4.50)

## 2016-09-18 LAB — COMPREHENSIVE METABOLIC PANEL
ALT: 17 U/L (ref 0–35)
AST: 18 U/L (ref 0–37)
Albumin: 4.1 g/dL (ref 3.5–5.2)
Alkaline Phosphatase: 69 U/L (ref 39–117)
BUN: 15 mg/dL (ref 6–23)
CHLORIDE: 105 meq/L (ref 96–112)
CO2: 32 meq/L (ref 19–32)
Calcium: 8.8 mg/dL (ref 8.4–10.5)
Creatinine, Ser: 0.58 mg/dL (ref 0.40–1.20)
GFR: 113.26 mL/min (ref >60.00)
GLUCOSE: 87 mg/dL (ref 70–99)
POTASSIUM: 3.8 meq/L (ref 3.5–5.1)
SODIUM: 142 meq/L (ref 135–145)
Total Bilirubin: 0.5 mg/dL (ref 0.2–1.2)
Total Protein: 6.3 g/dL (ref 6.0–8.3)

## 2016-09-18 LAB — LIPID PANEL
CHOL/HDL RATIO: 3
Cholesterol: 175 mg/dL (ref 0–200)
HDL: 69.5 mg/dL (ref >39.00)
LDL Cholesterol: 94 mg/dL (ref 0–99)
NONHDL: 105.69
TRIGLYCERIDES: 60 mg/dL (ref 0.0–149.0)
VLDL: 12 mg/dL (ref 0.0–40.0)

## 2016-09-18 LAB — CBC WITH DIFFERENTIAL/PLATELET
BASOS ABS: 0 10*3/uL (ref 0.0–0.1)
Basophils Relative: 0.4 % (ref 0.0–3.0)
EOS PCT: 3 % (ref 0.0–5.0)
Eosinophils Absolute: 0.1 10*3/uL (ref 0.0–0.7)
HCT: 36.3 % (ref 36.0–46.0)
Hemoglobin: 12.1 g/dL (ref 12.0–15.0)
LYMPHS ABS: 1.2 10*3/uL (ref 0.7–4.0)
Lymphocytes Relative: 30.6 % (ref 12.0–46.0)
MCHC: 33.3 g/dL (ref 30.0–36.0)
MCV: 91.2 fl (ref 78.0–100.0)
MONO ABS: 0.2 10*3/uL (ref 0.1–1.0)
Monocytes Relative: 5.2 % (ref 3.0–12.0)
NEUTROS ABS: 2.4 10*3/uL (ref 1.4–7.7)
NEUTROS PCT: 60.8 % (ref 43.0–77.0)
PLATELETS: 125 10*3/uL — AB (ref 150.0–400.0)
RBC: 3.98 Mil/uL (ref 3.87–5.11)
RDW: 15.2 % (ref 11.5–15.5)
WBC: 4 10*3/uL (ref 4.0–10.5)

## 2016-09-18 LAB — C-REACTIVE PROTEIN: CRP: 0.1 mg/dL — ABNORMAL LOW (ref 0.5–20.0)

## 2016-09-18 LAB — SEDIMENTATION RATE: Sed Rate: 4 mm/hr (ref 0–30)

## 2016-09-18 NOTE — Telephone Encounter (Signed)
Pt requested a update, she stated that she was to receive a referral on allergy testing Pt contact [phone]

## 2016-09-18 NOTE — Telephone Encounter (Signed)
Patient was seen on 09/12/16 note states she was to be referred to allergist for rash . Please advise.

## 2016-09-18 NOTE — Telephone Encounter (Signed)
Order placed for allergy referral.   Sent pt my chart message.

## 2016-09-19 ENCOUNTER — Encounter: Payer: Self-pay | Admitting: Internal Medicine

## 2016-09-19 LAB — ANA: Anti Nuclear Antibody(ANA): NEGATIVE

## 2016-09-19 LAB — RHEUMATOID FACTOR: Rhuematoid fact SerPl-aCnc: <14 IU/mL (ref <14)

## 2016-09-22 ENCOUNTER — Encounter: Payer: Self-pay | Admitting: Internal Medicine

## 2016-09-22 NOTE — Telephone Encounter (Signed)
See my chart message.  Can schedule a1c lab to be drawn if she desires to come in.  Will need to schedule lab appt and let me know so I can order the lab.  Thanks

## 2016-10-07 ENCOUNTER — Telehealth: Payer: Self-pay

## 2016-10-07 NOTE — Telephone Encounter (Signed)
Fax received from pharmacy.  Refill needed on Progesterone 1% apply 53ml topically once daily for 6 days out of the week.   Pt last o/v 09-12-16 Last lab 09-18-16  No f/u at this time.   We have never filled this for is it ok to send?

## 2016-10-07 NOTE — Telephone Encounter (Signed)
They will need to send [place] the rx for me to sign.  This is one the pharmacy makes up.  Just notify pharmacy to send rx and I will sign.  Thanks

## 2016-10-07 NOTE — Telephone Encounter (Signed)
Faxed

## 2016-10-07 NOTE — Telephone Encounter (Signed)
Signed and placed on your desk.

## 2016-10-07 NOTE — Telephone Encounter (Signed)
In box for you to sign

## 2016-10-09 DIAGNOSIS — J301 Allergic rhinitis due to pollen: Secondary | ICD-10-CM | POA: Diagnosis not present

## 2016-10-09 DIAGNOSIS — L500 Allergic urticaria: Secondary | ICD-10-CM | POA: Diagnosis not present

## 2016-10-09 DIAGNOSIS — J309 Allergic rhinitis, unspecified: Secondary | ICD-10-CM | POA: Diagnosis not present

## 2016-10-13 ENCOUNTER — Encounter: Payer: Self-pay | Admitting: Internal Medicine

## 2016-11-03 ENCOUNTER — Other Ambulatory Visit: Payer: Self-pay | Admitting: Internal Medicine

## 2016-11-03 NOTE — Telephone Encounter (Signed)
Medication: Ambien 10mg  Directions: 1/2 to 1 phs  Last given: 08/21/16 #30 Number refills: 1 Last o/v: 09-12-16 Follow up: n/a

## 2016-11-04 NOTE — Telephone Encounter (Signed)
Faxed to pharmacy

## 2016-11-10 ENCOUNTER — Ambulatory Visit: Payer: Self-pay | Admitting: Physician Assistant

## 2016-11-10 ENCOUNTER — Encounter: Payer: Self-pay | Admitting: Physician Assistant

## 2016-11-10 VITALS — BP 100/82 | HR 61 | Temp 98.1°F

## 2016-11-10 DIAGNOSIS — K121 Other forms of stomatitis: Secondary | ICD-10-CM

## 2016-11-10 MED ORDER — MAGIC MOUTHWASH: 5.0000 mL | Freq: Three times a day (TID) | ORAL | 2 refills | Status: DC | PRN

## 2016-11-10 NOTE — Progress Notes (Signed)
S: c/o mouth ulcers, states she had the swelling under her eyes and rash around her mouth again, ?if its related to pork products, states she saw her pcp and was evaluated for lupus which was negative, had allergy testing done at the ENT, was negative, has an appointment with dermatology but its 3 months away, feels as though she needs dukes for mouth ulcers  O: vitals wnl, nad, skin with a few ulcers on the buccal mucosa, skin with dried areas surrounding mouth, neck supple no lymph  A: mouth ulcers, rash  P: dukes magic mouthwash, refer to dermatology

## 2016-11-11 NOTE — Progress Notes (Signed)
[facility] spoke with Lelon Frohlich. Appointment scheduled on 12/15/2016 @ 10:20 with Dr. Elvera Lennox. All pertinent info. was faxed over to them and patient was notified.

## 2016-11-12 ENCOUNTER — Ambulatory Visit: Payer: Self-pay | Admitting: Physician Assistant

## 2016-11-20 ENCOUNTER — Other Ambulatory Visit: Payer: Self-pay | Admitting: Internal Medicine

## 2016-11-24 ENCOUNTER — Other Ambulatory Visit: Payer: Self-pay | Admitting: Neurology

## 2016-11-24 NOTE — Telephone Encounter (Signed)
I spoke to patient and let her know that you will be out of the office until Thursday and can address refill request then.  Patient last seen Oct 2016, has up coming appt 03/03/17. Patient states that she still has some medication left, but they are expired and the Rx is expired.  She reports that she does not take Modafinil often but lately has had to take more than usual, unknown if due to medication being expired or something else going on?

## 2016-11-24 NOTE — Telephone Encounter (Signed)
Patient called office requesting refill for modafinil (PROVIGIL) 100 MG tablet.  Follow up visit with Dr. Brett Fairy 03/03/17.

## 2016-11-27 MED ORDER — MODAFINIL 100 MG PO TABS: ORAL_TABLET | ORAL | 5 refills | Status: DC

## 2016-11-27 NOTE — Telephone Encounter (Signed)
I LM for patient (per DPR) that Dr. Brett Fairy did not change the prescription but did refill it. I will fax to [place]. I advised for patient to see how she feels taking this new prescription (she is currently taking expired medication) and if she still doesn't feel well call us back and we can try and make a sooner appt or see about a dosage change.

## 2016-11-27 NOTE — Addendum Note (Signed)
Addended by: Larey Seat on: 11/27/2016 10:01 AM   Modules accepted: Orders

## 2016-12-02 ENCOUNTER — Telehealth: Payer: Self-pay | Admitting: Physician Assistant

## 2016-12-02 ENCOUNTER — Other Ambulatory Visit: Payer: Self-pay | Admitting: Physician Assistant

## 2016-12-02 MED ORDER — METHYLPREDNISOLONE 4 MG PO TBPK: ORAL_TABLET | ORAL | 0 refills | Status: DC

## 2016-12-02 NOTE — Telephone Encounter (Signed)
Yes I'll send a refill for her to pharmacy

## 2016-12-02 NOTE — Progress Notes (Signed)
S: pt called office to inquire about getting a steroid pack, eyes have begun to swell again, was resolved with steroid pack O: phone call A: med refill P: medrol dose pack

## 2016-12-15 DIAGNOSIS — L710 Perioral dermatitis: Secondary | ICD-10-CM | POA: Diagnosis not present

## 2017-01-07 ENCOUNTER — Other Ambulatory Visit: Payer: Self-pay | Admitting: Internal Medicine

## 2017-01-26 ENCOUNTER — Other Ambulatory Visit: Payer: Self-pay | Admitting: Internal Medicine

## 2017-01-26 NOTE — Telephone Encounter (Signed)
rx ok'd for #20 with no refills.

## 2017-01-26 NOTE — Telephone Encounter (Signed)
Spoke with the patient, she is traveling to [place]. She didn't realize that the altitude is that high there.  She did take it last time and it helped.     FYI: wanted to let you know that she went out and bought a weighted blanket after reading reviews on line that it helps child with ADHD and etc, she has only been using 1/2 of Ambien and this blanket and been sleeping better then she has in years.

## 2017-01-26 NOTE — Telephone Encounter (Signed)
Please confirm with pt that she needs.  Is she planning to travel again?  Did she take previously?  If so, did she tolerate?  Just need a little more info.

## 2017-01-26 NOTE — Telephone Encounter (Signed)
Last OV was 09/12/16, last refill on this medication was 11/02/15, #20 with no refills.  Please advise, thanks

## 2017-02-16 ENCOUNTER — Other Ambulatory Visit: Payer: Self-pay | Admitting: Internal Medicine

## 2017-02-16 DIAGNOSIS — H0011 Chalazion right upper eyelid: Secondary | ICD-10-CM | POA: Diagnosis not present

## 2017-03-03 ENCOUNTER — Ambulatory Visit (INDEPENDENT_AMBULATORY_CARE_PROVIDER_SITE_OTHER): Payer: 59 | Admitting: Neurology

## 2017-03-03 ENCOUNTER — Encounter: Payer: Self-pay | Admitting: Neurology

## 2017-03-03 VITALS — BP 96/60 | HR 62 | Ht 66.25 in | Wt 149.0 lb

## 2017-03-03 DIAGNOSIS — F5101 Primary insomnia: Secondary | ICD-10-CM | POA: Diagnosis not present

## 2017-03-03 MED ORDER — ZOLPIDEM TARTRATE 5 MG PO TABS: 5.0000 mg | ORAL_TABLET | Freq: Every evening | ORAL | 2 refills | Status: DC | PRN

## 2017-03-03 NOTE — Patient Instructions (Signed)
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
## 2017-03-18 ENCOUNTER — Encounter: Payer: Self-pay | Admitting: Neurology

## 2017-03-18 ENCOUNTER — Other Ambulatory Visit: Payer: Self-pay | Admitting: Internal Medicine

## 2017-03-18 NOTE — Telephone Encounter (Signed)
Pt sees Dr Lydia Guiles for her sleep issues.  On 03/03/17 -she sent in rx for ambien.  Should have rx.  If questions about dose, etc = needs to go through Dr Doehmeir's office.

## 2017-03-18 NOTE — Telephone Encounter (Signed)
Per the chart she was prescribed 5mg  tablets from another Provider, please advise, thanks

## 2017-03-19 ENCOUNTER — Other Ambulatory Visit: Payer: Self-pay | Admitting: Neurology

## 2017-03-19 MED ORDER — ZOLPIDEM TARTRATE 5 MG PO TABS: 5.0000 mg | ORAL_TABLET | Freq: Every evening | ORAL | 2 refills | Status: DC | PRN

## 2017-04-14 ENCOUNTER — Other Ambulatory Visit: Payer: Self-pay | Admitting: Family Medicine

## 2017-04-14 ENCOUNTER — Ambulatory Visit
Admission: RE | Admit: 2017-04-14 | Discharge: 2017-04-14 | Disposition: A | Discharge status: Home / Self Care | Payer: 59 | Source: Ambulatory Visit | Attending: Family Medicine | Admitting: Family Medicine

## 2017-04-14 DIAGNOSIS — M5440 Lumbago with sciatica, unspecified side: Secondary | ICD-10-CM | POA: Diagnosis not present

## 2017-04-14 DIAGNOSIS — M545 Low back pain: Secondary | ICD-10-CM | POA: Diagnosis not present

## 2017-04-14 MED ORDER — CYCLOBENZAPRINE HCL 5 MG PO TABS: 5.0000 mg | ORAL_TABLET | Freq: Every day | ORAL | 0 refills | Status: DC

## 2017-04-14 MED ORDER — MELOXICAM 15 MG PO TABS: 15.0000 mg | ORAL_TABLET | Freq: Every day | ORAL | 0 refills | Status: DC

## 2017-04-16 ENCOUNTER — Other Ambulatory Visit: Payer: Self-pay | Admitting: Family Medicine

## 2017-04-16 ENCOUNTER — Ambulatory Visit: Payer: 59

## 2017-04-16 ENCOUNTER — Encounter: Payer: Self-pay | Admitting: Neurology

## 2017-04-16 DIAGNOSIS — M5440 Lumbago with sciatica, unspecified side: Secondary | ICD-10-CM

## 2017-04-17 ENCOUNTER — Ambulatory Visit
Admission: RE | Admit: 2017-04-17 | Discharge: 2017-04-17 | Disposition: A | Discharge status: Home / Self Care | Payer: 59 | Source: Ambulatory Visit | Attending: Family Medicine | Admitting: Family Medicine

## 2017-04-17 DIAGNOSIS — M5440 Lumbago with sciatica, unspecified side: Secondary | ICD-10-CM | POA: Diagnosis not present

## 2017-04-17 DIAGNOSIS — M5127 Other intervertebral disc displacement, lumbosacral region: Secondary | ICD-10-CM | POA: Diagnosis not present

## 2017-04-20 ENCOUNTER — Other Ambulatory Visit: Payer: Self-pay | Admitting: Family Medicine

## 2017-04-20 DIAGNOSIS — M5440 Lumbago with sciatica, unspecified side: Secondary | ICD-10-CM

## 2017-04-21 ENCOUNTER — Encounter: Payer: Self-pay | Admitting: Physical Therapy

## 2017-04-21 ENCOUNTER — Ambulatory Visit: Payer: 59 | Attending: Family Medicine | Admitting: Physical Therapy

## 2017-04-21 DIAGNOSIS — M5440 Lumbago with sciatica, unspecified side: Secondary | ICD-10-CM | POA: Diagnosis not present

## 2017-04-21 DIAGNOSIS — M6281 Muscle weakness (generalized): Secondary | ICD-10-CM

## 2017-04-21 DIAGNOSIS — M62830 Muscle spasm of back: Secondary | ICD-10-CM

## 2017-04-22 NOTE — Therapy (Signed)
[facility] [address] [place], [address] Phone: [phone]   Fax:  [phone]  Physical Therapy Evaluation  Patient Details  Name: Kimberly Figueroa MRN: [id number] Date of Birth: 30-Mar-1956 Referring Provider: Paulina Fusi MD  Encounter Date: 04/21/2017      PT End of Session - 04/21/17 1600    Visit Number 1   Number of Visits 12   Date for PT Re-Evaluation 06/02/17   PT Start Time 1093   PT Stop Time 1350   PT Time Calculation (min) 45 min   Activity Tolerance Patient tolerated treatment well;Patient limited by pain   Behavior During Therapy [facility] for tasks assessed/performed      Past Medical History:  Diagnosis Date  . Endometriosis   . History of frequent urinary tract infections    s/p bladder dilatation (11/05)  . Hypersomnia, recurrent 06/28/2014  . ITP (idiopathic thrombocytopenic purpura)   . Pneumonia     Past Surgical History:  Procedure Laterality Date  . bladder dilatation  11/05  . CESAREAN SECTION  1993  . COLONOSCOPY    . laproscopic surgery  10/06   found to have endometriosis  . SHOULDER SURGERY Left 2008  . TONSILLECTOMY  1961  . VAGINAL DELIVERY  1987  . VAGINAL HYSTERECTOMY  02/07    There were no vitals filed for this visit.       Subjective Assessment - 04/21/17 1316    Subjective Patient reports back pain with improving sympotms over all since initial onset with pain ranging from 2-3/10 up to 8-9/10. She is using standing desk and pain increases with prolonged sitting and standing and walking.    Pertinent History history of epsodes of back pain in lower back since her 20's, self treatment. previous therapy for cervical spine. current episode began 09/2016 for no apparent reason.   Limitations Sitting;Standing;Walking;House hold activities;Other (comment)  bending, twisting   How long can you sit comfortably? 1 hour   How long can you stand  comfortably? as long as she wants without increased pain   How long can you walk comfortably? unable to walk long distances   Diagnostic tests MRI: per report: bulging disc L4-5, central protrusion os disc, mild L5-S1   Patient Stated Goals decrease pain, return to prior level of function   Currently in Pain? Yes   Pain Score 3    Pain Location Back   Pain Orientation Lower;Right;Left   Pain Descriptors / Indicators Aching   Pain Type Chronic pain   Pain Onset More than a month ago   Aggravating Factors  sitting, bending, twisting, walking   Pain Relieving Factors standing, resting, medication   Effect of Pain on Daily Activities limits activity            [facility] PT Assessment - 04/21/17 1857      Assessment   Medical Diagnosis back pain of lumbar region with sciatica   Referring Provider Paulina Fusi MD   Onset Date/Surgical Date 09/25/16   Hand Dominance Right   Prior Therapy for cervical spine only     Precautions   Precautions None     Restrictions   Weight Bearing Restrictions No     Balance Screen   Has the patient fallen in the past 6 months No     [place] residence   Living Arrangements Spouse/significant other;Children   Type of [place]  to enter   Entrance Stairs-Number of Steps 10   Entrance Stairs-Rails Left   Home Layout Two level   Alternate Level Stairs-Number of Steps 17   Alternate Level Stairs-Rails Left     Prior Function   Level of Independence Independent   Vocation Full time employment   Vocation Requirements health care executive    Leisure travel     Cognition   Overall Cognitive Status Within Functional Limits for tasks assessed     Observation/Other Assessments   Modified [place] 34% (moderate self perceived disability)     Sensation   Light Touch Appears Intact     Posture/Postural Control   Postural Limitations Rounded Shoulders;Increased lumbar lordosis;Anterior  pelvic tilt   Posture Comments slouched sitting     ROM / Strength   AROM / PROM / Strength AROM;Strength     AROM   Overall AROM Comments lumbar spine: flexion deferred due to diagnosis, extension limited 50% with increased LBP, side bend left and right WNL      Strength   Overall Strength Within functional limits for tasks performed   Overall Strength Comments both LE's major muscle groups WNL      Slump test   Findings Negative   Side --  bilateral with mild + on left     Straight Leg Raise   Findings Negative   Side  --  bilateral and crossed SLR negative bilateral            Objective measurements completed on examination: See above findings.           PT Education - 04/22/17 1500    Education provided Yes   Education Details body mechanics, home exercises prone progression   Person(s) Educated Patient   Methods Explanation;Demonstration;Verbal cues;Handout   Comprehension Verbalized understanding;Returned demonstration;Verbal cues required             PT Long Term Goals - 04/21/17 1929      PT LONG TERM GOAL #1   Title Patient will demonstrate improved function with daily tasks and decreased back pain as indicated by MODI score of 20% or less   Baseline 34%   Status New   Target Date 06/02/17     PT LONG TERM GOAL #2   Title Patient will demonstrate improved posture awareness and pain control strategies to allow patient to sit/stand for >1hour with mild to no pain    Baseline unable to sit/stand for 1 hour without increased pain   Status New   Target Date 06/02/17     PT LONG TERM GOAL #3   Title Patient will be independent with home program for posture awareness, pain control, progressive exercises to allow patient to transition to self management once discharged from physical therapy   Baseline no knowledge of appropriate pain conrtol strategies, exercise progression without assistance, instruction   Status New   Target Date 06/02/17                 Plan - 04/21/17 1400    Clinical Impression Statement Patient is a 61 year old female who presents with back pain that is improving in sympotms since initial onset with pain ranging from 2-3/10 up to 8-9/10. She is using standing desk and pain increases with prolonged sitting and standing and walking. She has limitations with personal care, daily tasks due to pain and limited to no knowledge of appropriate pain control strategies and exercise progression and will benefit from physical therapy intervention to return  to prior level of function and manage symptoms.   History and Personal Factors relevant to plan of care: pain began in back with sciatica and improving at this time   Clinical Presentation Stable   Clinical Presentation due to: improving   Clinical Decision Making Low   Rehab Potential Good   Clinical Impairments Affecting Rehab Potential (+)acute condition, prior level of function(-)prior history of back symptoms   PT Frequency 2x / week   PT Duration 6 weeks   PT Treatment/Interventions Iontophoresis 4mg /ml Dexamethasone;Electrical Stimulation;Cryotherapy;Moist Heat;Traction;Ultrasound;Patient/family education;Neuromuscular re-education;Therapeutic exercise;Manual techniques;Dry needling   PT Next Visit Plan pain control, core stabilization   PT Home Exercise Plan body mechanics, posture correction, use of lumbar support   Consulted and Agree with Plan of Care Patient      Patient will benefit from skilled therapeutic intervention in order to improve the following deficits and impairments:  Decreased strength, Pain, Impaired perceived functional ability, Increased muscle spasms, Difficulty walking, Decreased range of motion  Visit Diagnosis: Low back pain with sciatica, sciatica laterality unspecified, unspecified back pain laterality, unspecified chronicity - Plan: PT plan of care cert/re-cert  Muscle spasm of back - Plan: PT plan of care  cert/re-cert  Muscle weakness (generalized) - Plan: PT plan of care cert/re-cert     Problem List Patient Active Problem List   Diagnosis Date Noted  . Rash 09/14/2016  . Worms in stool 07/25/2016  . Travel advice encounter 11/05/2015  . Stress 06/03/2015  . Health care maintenance 11/05/2014  . Chronic insomnia 10/12/2014  . Insomnia due to medical condition 10/12/2014  . Hypersomnia, recurrent 06/28/2014  . Thrombocytopenia ([place]) 09/20/2013  . Anemia 09/20/2013  . Menopausal symptoms 09/20/2013  . Insomnia 09/20/2013    Jomarie Longs PT 04/22/2017, 7:48 PM   [facility] [address], [place], [address] Phone: [phone]   Fax:  [phone]  Name: Kimberly Figueroa MRN: [id number] Date of Birth: 1955-12-23

## 2017-04-23 ENCOUNTER — Encounter: Payer: Self-pay | Admitting: Physical Therapy

## 2017-04-23 ENCOUNTER — Ambulatory Visit: Payer: 59 | Admitting: Physical Therapy

## 2017-04-23 DIAGNOSIS — M6281 Muscle weakness (generalized): Secondary | ICD-10-CM | POA: Diagnosis not present

## 2017-04-23 DIAGNOSIS — M62830 Muscle spasm of back: Secondary | ICD-10-CM | POA: Diagnosis not present

## 2017-04-23 DIAGNOSIS — M5440 Lumbago with sciatica, unspecified side: Secondary | ICD-10-CM | POA: Diagnosis not present

## 2017-04-23 NOTE — Therapy (Signed)
[facility] [address], [place], [address] Phone: [phone]   Fax:  [phone]  Physical Therapy Treatment  Patient Details  Name: Kimberly Figueroa MRN: [id number] Date of Birth: 05-Apr-1956 Referring Provider: Paulina Fusi MD  Encounter Date: 04/23/2017      PT End of Session - 04/23/17 1720    Visit Number 2   Number of Visits 12   Date for PT Re-Evaluation 06/02/17   PT Start Time 7035   PT Stop Time 1725   PT Time Calculation (min) 38 min   Activity Tolerance Patient tolerated treatment well;Patient limited by pain   Behavior During Therapy [facility] for tasks assessed/performed      Past Medical History:  Diagnosis Date  . Endometriosis   . History of frequent urinary tract infections    s/p bladder dilatation (11/05)  . Hypersomnia, recurrent 06/28/2014  . ITP (idiopathic thrombocytopenic purpura)   . Pneumonia     Past Surgical History:  Procedure Laterality Date  . bladder dilatation  11/05  . CESAREAN SECTION  1993  . COLONOSCOPY    . laproscopic surgery  10/06   found to have endometriosis  . SHOULDER SURGERY Left 2008  . TONSILLECTOMY  1961  . VAGINAL DELIVERY  1987  . VAGINAL HYSTERECTOMY  02/07    There were no vitals filed for this visit.      Subjective Assessment - 04/23/17 1718    Subjective Patient reports back pain that is improving with positioning.    Pertinent History history of epsodes of back pain in lower back since her 20's, self treatment. previous therapy for cervical spine. current episode began 09/2016 for no apparent reason.   Limitations Sitting;Standing;Walking;House hold activities;Other (comment)  bending, twisting   How long can you sit comfortably? 1 hour   How long can you stand comfortably? as long as she wants without increased pain   How long can you walk comfortably? unable to walk long distances   Diagnostic tests MRI: per report: bulging  disc L4-5, central protrusion os disc, mild L5-S1   Patient Stated Goals decrease pain, return to prior level of function   Currently in Pain? Yes   Pain Score 3    Pain Location Back   Pain Orientation Lower;Left;Right   Pain Descriptors / Indicators Aching   Pain Type Chronic pain   Pain Onset More than a month ago   Pain Frequency Intermittent       Objective; Palpation: + moderate spasms along lumbar spine  Treatment: Manual therapy: 15 min. STM to lumbar spine paraspinals with patient prone lying over 2 pillows   Modalities: Electrical stimulation: 20 min High volt estim.clincial program for muscle spasms  (4) electrodes applied to lumbar spine, intensity to tolerance with patient prone lying with LE's supported on pillow goal: pain, spasms Prone lying over 2 pillows followed by prone on elbows x 2 min. Ball between knees in sitting stabilization with hip adduction and glute sets x 10 reps with 3 second holds    Patient response to treatment: patient demonstrated improved technique with exercises with minimal VC for correct alignment. Patient with decreased pain from  3 /10 to  2/10. Patient with decreased spasms by 50% following STM           PT Education - 04/23/17 1719    Education provided Yes   Education Details reviewed posture, body mechanics for in/out of car; reviewed exercise progression  Person(s) Educated Patient   Methods Explanation;Demonstration;Verbal cues   Comprehension Verbalized understanding             PT Long Term Goals - 04/21/17 1929      PT LONG TERM GOAL #1   Title Patient will demonstrate improved function with daily tasks and decreased back pain as indicated by MODI score of 20% or less   Baseline 34%   Status New   Target Date 06/02/17     PT LONG TERM GOAL #2   Title Patient will demonstrate improved posture awareness and pain control strategies to allow patient to sit/stand for >1hour with mild to no pain    Baseline  unable to sit/stand for 1 hour without increased pain   Status New   Target Date 06/02/17     PT LONG TERM GOAL #3   Title Patient will be independent with home program for posture awareness, pain control, progressive exercises to allow patient to transition to self management once discharged from physical therapy   Baseline no knowledge of appropriate pain conrtol strategies, exercise progression without assistance, instruction   Status New   Target Date 06/02/17               Plan - 04/23/17 1720    Clinical Impression Statement Patient is progressing with exercises with positioning and posture awareness and body mechanics. She continues with back pain limiting function and will benefit from continued physical therapy intervention to further decrease pain, spasms and progress exercises as indicated.    Rehab Potential Good   Clinical Impairments Affecting Rehab Potential (+)acute condition, prior level of function(-)prior history of back symptoms   PT Frequency 2x / week   PT Duration 6 weeks   PT Treatment/Interventions Iontophoresis 4mg /ml Dexamethasone;Electrical Stimulation;Cryotherapy;Moist Heat;Traction;Ultrasound;Patient/family education;Neuromuscular re-education;Therapeutic exercise;Manual techniques;Dry needling   PT Next Visit Plan pain control, core stabilization   PT Home Exercise Plan body mechanics, posture correction, use of lumbar support      Patient will benefit from skilled therapeutic intervention in order to improve the following deficits and impairments:  Decreased strength, Pain, Impaired perceived functional ability, Increased muscle spasms, Difficulty walking, Decreased range of motion  Visit Diagnosis: Low back pain with sciatica, sciatica laterality unspecified, unspecified back pain laterality, unspecified chronicity  Muscle spasm of back  Muscle weakness (generalized)     Problem List Patient Active Problem List   Diagnosis Date Noted  .  Rash 09/14/2016  . Worms in stool 07/25/2016  . Travel advice encounter 11/05/2015  . Stress 06/03/2015  . Health care maintenance 11/05/2014  . Chronic insomnia 10/12/2014  . Insomnia due to medical condition 10/12/2014  . Hypersomnia, recurrent 06/28/2014  . Thrombocytopenia ([place]) 09/20/2013  . Anemia 09/20/2013  . Menopausal symptoms 09/20/2013  . Insomnia 09/20/2013    Jomarie Longs PT 04/24/2017, 10:22 AM  [facility] [address], [place], [address] Phone: [phone]   Fax:  [phone]  Name: Kimberly Figueroa MRN: [id number] Date of Birth: 05/09/1956

## 2017-04-28 ENCOUNTER — Encounter: Payer: Self-pay | Admitting: Physical Therapy

## 2017-04-28 ENCOUNTER — Ambulatory Visit: Payer: 59 | Attending: Family Medicine | Admitting: Physical Therapy

## 2017-04-28 DIAGNOSIS — M62830 Muscle spasm of back: Secondary | ICD-10-CM

## 2017-04-28 DIAGNOSIS — M6281 Muscle weakness (generalized): Secondary | ICD-10-CM

## 2017-04-28 DIAGNOSIS — M5440 Lumbago with sciatica, unspecified side: Secondary | ICD-10-CM

## 2017-04-28 NOTE — Therapy (Signed)
[facility] [address] [place], [address] Phone: [phone]   Fax:  [phone]  Physical Therapy Treatment  Patient Details  Name: Kimberly Figueroa MRN: [id number] Date of Birth: Apr 30, 1956 Referring Provider: Paulina Fusi MD  Encounter Date: 04/28/2017      PT End of Session - 04/28/17 1825    Visit Number 3   Number of Visits 12   Date for PT Re-Evaluation 06/02/17   PT Start Time 1636   PT Stop Time 1725   PT Time Calculation (min) 49 min   Activity Tolerance Patient tolerated treatment well;Patient limited by pain   Behavior During Therapy [facility] for tasks assessed/performed      Past Medical History:  Diagnosis Date  . Endometriosis   . History of frequent urinary tract infections    s/p bladder dilatation (11/05)  . Hypersomnia, recurrent 06/28/2014  . ITP (idiopathic thrombocytopenic purpura)   . Pneumonia     Past Surgical History:  Procedure Laterality Date  . bladder dilatation  11/05  . CESAREAN SECTION  1993  . COLONOSCOPY    . laproscopic surgery  10/06   found to have endometriosis  . SHOULDER SURGERY Left 2008  . TONSILLECTOMY  1961  . VAGINAL DELIVERY  1987  . VAGINAL HYSTERECTOMY  02/07    There were no vitals filed for this visit.      Subjective Assessment - 04/28/17 1640    Subjective Patient reports back pain that is improving with positioning. Pain level today much less and she is able to do more during the days.    Pertinent History history of epsodes of back pain in lower back since her 20's, self treatment. previous therapy for cervical spine. current episode began 09/2016 for no apparent reason.   Limitations Sitting;Standing;Walking;House hold activities;Other (comment)  bending, twisting   How long can you sit comfortably? 1 hour   How long can you stand comfortably? as long as she wants without increased pain   How long can you walk comfortably?  unable to walk long distances   Diagnostic tests MRI: per report: bulging disc L4-5, central protrusion os disc, mild L5-S1   Patient Stated Goals decrease pain, return to prior level of function   Currently in Pain? Yes   Pain Score 1    Pain Orientation Lower   Pain Descriptors / Indicators Aching   Pain Type Chronic pain   Pain Onset More than a month ago   Pain Frequency Intermittent      Objective; Palpation: + moderate spasms along lumbar spine, right>left  Treatment: Manual therapy: 15 min. Goal: pain, spasms STM to lumbar spine paraspinals and gluteal/piriformis muscles with patient prone lying over 1 pillow; superficial techniques   Modalities: Electrical stimulation: 20 min High volt estim.clincial program for muscle spasms  (4) electrodes applied to lumbar spine, intensity to tolerance with patient prone lying with LE's supported on pillow goal: pain, spasms  Therapeutic exercise: patient performed with demonstration, instruction, verbal cues of therapist: goal: improve strength, decrease pain, independent with home program Prone lying over 1pillow followed by prone on elbows x 2 min. Standing stabilization: red resistive band: Scapular rows high and low x 10 reps each Palloff press modified to press forward with patient facing band x 1 resp   Patient response to treatment: Patient demonstrated improved technique with exercises with minimal VC for correct alignment, posture. Patient with Patient with decreased spasms by 50% following STM  PT Education - 04/28/17 1837    Education provided Yes   Education Details HEP; exercises instruction for stabilization; re assessed exercises for home   Person(s) Educated Patient   Methods Explanation;Demonstration;Verbal cues;Handout   Comprehension Verbalized understanding;Returned demonstration;Verbal cues required             PT Long Term Goals - 04/21/17 1929      PT LONG TERM GOAL #1   Title Patient  will demonstrate improved function with daily tasks and decreased back pain as indicated by MODI score of 20% or less   Baseline 34%   Status New   Target Date 06/02/17     PT LONG TERM GOAL #2   Title Patient will demonstrate improved posture awareness and pain control strategies to allow patient to sit/stand for >1hour with mild to no pain    Baseline unable to sit/stand for 1 hour without increased pain   Status New   Target Date 06/02/17     PT LONG TERM GOAL #3   Title Patient will be independent with home program for posture awareness, pain control, progressive exercises to allow patient to transition to self management once discharged from physical therapy   Baseline no knowledge of appropriate pain conrtol strategies, exercise progression without assistance, instruction   Status New   Target Date 06/02/17               Plan - 04/28/17 1826    Clinical Impression Statement Patient is progressing with decreasing spasms, locallized to right side lumbar spine, and decreasing pain in lower back with current treatment and improved posture awareness with dailyt tasks.    Rehab Potential Good   Clinical Impairments Affecting Rehab Potential (+)acute condition, prior level of function(-)prior history of back symptoms   PT Frequency 2x / week   PT Duration 6 weeks   PT Treatment/Interventions Iontophoresis 4mg /ml Dexamethasone;Electrical Stimulation;Cryotherapy;Moist Heat;Traction;Ultrasound;Patient/family education;Neuromuscular re-education;Therapeutic exercise;Manual techniques;Dry needling   PT Next Visit Plan pain control, core stabilization   PT Home Exercise Plan body mechanics, posture correction, use of lumbar support      Patient will benefit from skilled therapeutic intervention in order to improve the following deficits and impairments:  Decreased strength, Pain, Impaired perceived functional ability, Increased muscle spasms, Difficulty walking, Decreased range of  motion  Visit Diagnosis: Muscle spasm of back  Low back pain with sciatica, sciatica laterality unspecified, unspecified back pain laterality, unspecified chronicity  Muscle weakness (generalized)     Problem List Patient Active Problem List   Diagnosis Date Noted  . Rash 09/14/2016  . Worms in stool 07/25/2016  . Travel advice encounter 11/05/2015  . Stress 06/03/2015  . Health care maintenance 11/05/2014  . Chronic insomnia 10/12/2014  . Insomnia due to medical condition 10/12/2014  . Hypersomnia, recurrent 06/28/2014  . Thrombocytopenia ([place]) 09/20/2013  . Anemia 09/20/2013  . Menopausal symptoms 09/20/2013  . Insomnia 09/20/2013    Jomarie Longs PT 04/28/2017, 6:38 PM  [facility] [address], [place], [address] Phone: [phone]   Fax:  [phone]  Name: Kimberly Figueroa MRN: [id number] Date of Birth: 12-10-1955

## 2017-04-29 DIAGNOSIS — L718 Other rosacea: Secondary | ICD-10-CM | POA: Diagnosis not present

## 2017-04-29 DIAGNOSIS — L308 Other specified dermatitis: Secondary | ICD-10-CM | POA: Diagnosis not present

## 2017-04-29 MED ORDER — ZOLPIDEM TARTRATE 10 MG PO TABS: ORAL_TABLET | ORAL | 2 refills | Status: DC

## 2017-04-29 NOTE — Telephone Encounter (Signed)
I will refill ambien as you suggested as 10 mg tab, 1/2 at night. . CD

## 2017-04-30 ENCOUNTER — Encounter: Payer: 59 | Admitting: Physical Therapy

## 2017-04-30 ENCOUNTER — Encounter: Payer: Self-pay | Admitting: Physical Therapy

## 2017-04-30 ENCOUNTER — Ambulatory Visit: Payer: 59 | Admitting: Physical Therapy

## 2017-04-30 DIAGNOSIS — M62830 Muscle spasm of back: Secondary | ICD-10-CM

## 2017-04-30 DIAGNOSIS — M5440 Lumbago with sciatica, unspecified side: Secondary | ICD-10-CM

## 2017-04-30 DIAGNOSIS — M6281 Muscle weakness (generalized): Secondary | ICD-10-CM | POA: Diagnosis not present

## 2017-05-01 NOTE — Therapy (Signed)
[facility] [address], [place], [address] Phone: [phone]   Fax:  [phone]  Physical Therapy Treatment  Patient Details  Name: Kimberly Figueroa MRN: [id number] Date of Birth: 1956/07/19 Referring Provider: Paulina Fusi MD  Encounter Date: 04/30/2017      PT End of Session - 04/30/17 1900    Visit Number 4   Number of Visits 12   Date for PT Re-Evaluation 06/02/17   PT Start Time 1805   PT Stop Time 1850   PT Time Calculation (min) 45 min   Activity Tolerance Patient tolerated treatment well;Patient limited by pain   Behavior During Therapy [facility] for tasks assessed/performed      Past Medical History:  Diagnosis Date  . Endometriosis   . History of frequent urinary tract infections    s/p bladder dilatation (11/05)  . Hypersomnia, recurrent 06/28/2014  . ITP (idiopathic thrombocytopenic purpura)   . Pneumonia     Past Surgical History:  Procedure Laterality Date  . bladder dilatation  11/05  . CESAREAN SECTION  1993  . COLONOSCOPY    . laproscopic surgery  10/06   found to have endometriosis  . SHOULDER SURGERY Left 2008  . TONSILLECTOMY  1961  . VAGINAL DELIVERY  1987  . VAGINAL HYSTERECTOMY  02/07    There were no vitals filed for this visit.      Subjective Assessment - 04/30/17 1815    Subjective Increased pain today due to sitting in class all day. She reports pain and spasms from lower back up to mid thoracic spine and neck.    Pertinent History history of epsodes of back pain in lower back since her 20's, self treatment. previous therapy for cervical spine. current episode began 09/2016 for no apparent reason.   Limitations Sitting;Standing;Walking;House hold activities;Other (comment)  bending, twisting   How long can you sit comfortably? 1 hour   How long can you stand comfortably? as long as she wants without increased pain   How long can you walk comfortably?  unable to walk long distances   Diagnostic tests MRI: per report: bulging disc L4-5, central protrusion of disc, mild L5-S1   Patient Stated Goals decrease pain, return to prior level of function   Currently in Pain? Yes   Pain Score 7    Pain Location Back   Pain Orientation Lower;Mid;Upper   Pain Descriptors / Indicators Aching;Tightness;Spasm   Pain Type Chronic pain   Pain Onset More than a month ago   Pain Frequency Intermittent      Objective; Palpation: + moderate spasms along lumbar spine, right>left  Treatment: Manual therapy:15 min. Goal: pain, spasms STM to lumbar spine up to thoracic spine paraspinals and gluteal/piriformis muscles with patient prone lying over 2 pillows; superficial techniques   Modalities: Electrical stimulation: 20 min High volt estim.clincial program for muscle spasms (4) electrodes applied to lumbar spine, intensity to tolerance with patient prone lying withLE'ssupported on pillow goal: pain, spasms  Therapeutic exercise: patient performed with demonstration, instruction, verbal cues of therapist: goal: improve strength, decrease pain, independent with home program Prone lying over 2 pillow   Patient response to treatment: Patient demonstrated improved soft tissue elasticity with decreased spasms in back following STM/treatment. Pain decreased from 7/10 to 4/10 at end of session       PT Education - 04/30/17 1910    Education provided Yes   Education Details use of ice, positioning to control pain and  decreased symptoms   Person(s) Educated Patient   Methods Explanation;Demonstration;Verbal cues   Comprehension Verbalized understanding;Returned demonstration;Verbal cues required             PT Long Term Goals - 04/21/17 1929      PT LONG TERM GOAL #1   Title Patient will demonstrate improved function with daily tasks and decreased back pain as indicated by MODI score of 20% or less   Baseline 34%   Status New   Target  Date 06/02/17     PT LONG TERM GOAL #2   Title Patient will demonstrate improved posture awareness and pain control strategies to allow patient to sit/stand for >1hour with mild to no pain    Baseline unable to sit/stand for 1 hour without increased pain   Status New   Target Date 06/02/17     PT LONG TERM GOAL #3   Title Patient will be independent with home program for posture awareness, pain control, progressive exercises to allow patient to transition to self management once discharged from physical therapy   Baseline no knowledge of appropriate pain conrtol strategies, exercise progression without assistance, instruction   Status New   Target Date 06/02/17               Plan - 04/30/17 1915    Clinical Impression Statement Patient demonstrated improvement with spasms and ability to stand and walk with 30% less pain at end of session. She continues with inflammation and pain that limit function and will benefit from continued physical therapy intervention to achieve goals.    Rehab Potential Good   Clinical Impairments Affecting Rehab Potential (+)acute condition, prior level of function(-)prior history of back symptoms   PT Frequency 2x / week   PT Duration 6 weeks   PT Treatment/Interventions Iontophoresis 4mg /ml Dexamethasone;Electrical Stimulation;Cryotherapy;Moist Heat;Traction;Ultrasound;Patient/family education;Neuromuscular re-education;Therapeutic exercise;Manual techniques;Dry needling   PT Next Visit Plan pain control, core stabilization   PT Home Exercise Plan body mechanics, posture correction, use of lumbar support      Patient will benefit from skilled therapeutic intervention in order to improve the following deficits and impairments:  Decreased strength, Pain, Impaired perceived functional ability, Increased muscle spasms, Difficulty walking, Decreased range of motion  Visit Diagnosis: Muscle spasm of back  Low back pain with sciatica, sciatica laterality  unspecified, unspecified back pain laterality, unspecified chronicity  Muscle weakness (generalized)     Problem List Patient Active Problem List   Diagnosis Date Noted  . Rash 09/14/2016  . Worms in stool 07/25/2016  . Travel advice encounter 11/05/2015  . Stress 06/03/2015  . Health care maintenance 11/05/2014  . Chronic insomnia 10/12/2014  . Insomnia due to medical condition 10/12/2014  . Hypersomnia, recurrent 06/28/2014  . Thrombocytopenia ([place]) 09/20/2013  . Anemia 09/20/2013  . Menopausal symptoms 09/20/2013  . Insomnia 09/20/2013    Jomarie Longs PT 05/01/2017, 10:18 PM  [facility] [address], [place], [address] Phone: [phone]   Fax:  [phone]  Name: Kimberly Figueroa MRN: [id number] Date of Birth: [birth year]

## 2017-05-04 ENCOUNTER — Ambulatory Visit: Payer: 59 | Admitting: Physical Therapy

## 2017-05-06 ENCOUNTER — Ambulatory Visit: Payer: 59 | Admitting: Physical Therapy

## 2017-05-08 ENCOUNTER — Other Ambulatory Visit: Payer: Self-pay | Admitting: Internal Medicine

## 2017-05-08 NOTE — Telephone Encounter (Signed)
NO ov since 1/18 ok to fill?

## 2017-05-08 NOTE — Telephone Encounter (Signed)
Needs a f/u appt scheduled.  Ok to refill x 1.  I sent in refill

## 2017-05-11 ENCOUNTER — Ambulatory Visit: Payer: 59 | Admitting: Physical Therapy

## 2017-05-11 ENCOUNTER — Other Ambulatory Visit: Payer: Self-pay | Admitting: Family Medicine

## 2017-05-11 ENCOUNTER — Telehealth: Payer: Self-pay | Admitting: Neurology

## 2017-05-11 ENCOUNTER — Encounter: Payer: Self-pay | Admitting: Physical Therapy

## 2017-05-11 DIAGNOSIS — M5440 Lumbago with sciatica, unspecified side: Secondary | ICD-10-CM

## 2017-05-11 DIAGNOSIS — M62830 Muscle spasm of back: Secondary | ICD-10-CM | POA: Diagnosis not present

## 2017-05-11 DIAGNOSIS — M6281 Muscle weakness (generalized): Secondary | ICD-10-CM

## 2017-05-11 MED ORDER — MELOXICAM 15 MG PO TABS: 15.0000 mg | ORAL_TABLET | Freq: Every day | ORAL | 0 refills | Status: DC

## 2017-05-11 MED ORDER — CYCLOBENZAPRINE HCL 5 MG PO TABS: 5.0000 mg | ORAL_TABLET | Freq: Every day | ORAL | 0 refills | Status: DC

## 2017-05-11 NOTE — Therapy (Signed)
[facility] [address], [place], [address] Phone: [phone]   Fax:  [phone]  Physical Therapy Treatment  Patient Details  Name: Kimberly Figueroa MRN: [id number] Date of Birth: 09-Aug-1956 Referring Provider: Paulina Fusi MD  Encounter Date: 05/11/2017      PT End of Session - 05/11/17 1815    Visit Number 5   Number of Visits 12   Date for PT Re-Evaluation 06/02/17   PT Start Time 7169   PT Stop Time 1825   PT Time Calculation (min) 43 min   Activity Tolerance Patient tolerated treatment well;Patient limited by pain   Behavior During Therapy [facility] for tasks assessed/performed      Past Medical History:  Diagnosis Date  . Endometriosis   . History of frequent urinary tract infections    s/p bladder dilatation (11/05)  . Hypersomnia, recurrent 06/28/2014  . ITP (idiopathic thrombocytopenic purpura)   . Pneumonia     Past Surgical History:  Procedure Laterality Date  . bladder dilatation  11/05  . CESAREAN SECTION  1993  . COLONOSCOPY    . laproscopic surgery  10/06   found to have endometriosis  . SHOULDER SURGERY Left 2008  . TONSILLECTOMY  1961  . VAGINAL DELIVERY  1987  . VAGINAL HYSTERECTOMY  02/07    There were no vitals filed for this visit.      Subjective Assessment - 05/11/17 1744    Subjective Patient is doing well. She is having continued spasms right side of lower back and pain in sacral region primarily. She is compliant with home exercises for prone progression and standing stabilization exercises. She is not sure of proper body mechanics for daily tasks; she does have handout.    Pertinent History history of epsodes of back pain in lower back since her 20's, self treatment. previous therapy for cervical spine. current episode began 09/2016 for no apparent reason.   Limitations Sitting;Standing;Walking;House hold activities;Other (comment)  bending, twisting   How  long can you sit comfortably? 1 hour   How long can you stand comfortably? as long as she wants without increased pain   How long can you walk comfortably? unable to walk long distances   Diagnostic tests MRI: per report: bulging disc L4-5, central protrusion os disc, mild L5-S1   Patient Stated Goals decrease pain, return to prior level of function   Currently in Pain? Yes   Pain Score 2    Pain Location Back   Pain Descriptors / Indicators Aching;Tightness   Pain Type Chronic pain   Pain Onset More than a month ago   Pain Frequency Intermittent        Objective; Palpation: + moderate spasms along lumbar spine, right>left; tender bilateral sacral region L5-S1  Treatment: Manual therapy:13 min. Goal: pain, spasms STM to lumbar spine up to thoracic spine paraspinals muscles right>left sidewith patient prone lying over 2pillows; superficial techniques Overpressure to sacrum performed with patient reporting initially decrease in symptoms and on 3rd repetition reported transient increased symptoms into right LE which resolved with release of pressure over area  Modalities: Electrical stimulation: 20 min High volt estim.clincial program for muscle spasms (4) electrodes applied to lumbar spine, sacral region bilaterally, intensity to tolerance with patient prone lying over 2 pillows withLE'ssupported on pillow goal: pain, spasms  Therapeutic exercise: patient performed with demonstration, instruction, verbal cues of therapist: goal: improve strength, decrease pain, independent with home program Prone lying over 2 pillows;  prone on elbows with one pillow under abdomen; prone press ups x 5 reps Reviewed with demonstration how to transfer sit<>stand without excessive lumbar flexion, proper lifting with focus on not bending or twisting back Instructed in stabilization in sitting:  Hip adduction with ball squeeze and glute sets x 10 with 3 second holds Clam in sitting with resistive  band (green) x 15 reps Demonstrated clam in side lying and bridging with ball supine lying (handout given) Re assessed standing stabilization for high/low rows    Patient response to treatment: Patient returned demonstration of exercises with good technique and moderate VC. Decreased spasms by 50% with STM and estim. Sacral region remained tender at end of session.          PT Education - 05/11/17 1800    Education provided Yes   Education Details home program, educated in posture, positions to avoid to decreased strain on back: no bending with twisting, no repetitive bending or prolonged siting; added sitting stabilization with ball and band for hip adduction with ball and glute sets and hip abduction/clam; prone press ups   Person(s) Educated Patient   Methods Explanation;Demonstration; verbal cuing; handout   Comprehension Verbalized understanding; returned demonstration with verbal cues             PT Long Term Goals - 04/21/17 1929      PT LONG TERM GOAL #1   Title Patient will demonstrate improved function with daily tasks and decreased back pain as indicated by MODI score of 20% or less   Baseline 34%   Status New   Target Date 06/02/17     PT LONG TERM GOAL #2   Title Patient will demonstrate improved posture awareness and pain control strategies to allow patient to sit/stand for >1hour with mild to no pain    Baseline unable to sit/stand for 1 hour without increased pain   Status New   Target Date 06/02/17     PT LONG TERM GOAL #3   Title Patient will be independent with home program for posture awareness, pain control, progressive exercises to allow patient to transition to self management once discharged from physical therapy   Baseline no knowledge of appropriate pain conrtol strategies, exercise progression without assistance, instruction   Status New   Target Date 06/02/17               Plan - 05/11/17 1830    Clinical Impression Statement  Patient is progressing with goals and increasing knowledge of positioning, body mechanics to decrease strain on lower back. she will benefit from additional physical therapy intervention to re assess progress, home program with anticipated discharge next visit.    Rehab Potential Good   Clinical Impairments Affecting Rehab Potential (+)acute condition, prior level of function(-)prior history of back symptoms   PT Frequency 2x / week   PT Duration 6 weeks   PT Treatment/Interventions Iontophoresis 4mg /ml Dexamethasone;Electrical Stimulation;Cryotherapy;Moist Heat;Traction;Ultrasound;Patient/family education;Neuromuscular re-education;Therapeutic exercise;Manual techniques;Dry needling   PT Next Visit Plan pain control, core stabilization   PT Home Exercise Plan body mechanics, posture correction, use of lumbar support; home program for stabilization exercises sitting,       Patient will benefit from skilled therapeutic intervention in order to improve the following deficits and impairments:  Decreased strength, Pain, Impaired perceived functional ability, Increased muscle spasms, Difficulty walking, Decreased range of motion  Visit Diagnosis: Muscle spasm of back  Low back pain with sciatica, sciatica laterality unspecified, unspecified back pain laterality, unspecified chronicity  Muscle  weakness (generalized)     Problem List Patient Active Problem List   Diagnosis Date Noted  . Rash 09/14/2016  . Worms in stool 07/25/2016  . Travel advice encounter 11/05/2015  . Stress 06/03/2015  . Health care maintenance 11/05/2014  . Chronic insomnia 10/12/2014  . Insomnia due to medical condition 10/12/2014  . Hypersomnia, recurrent 06/28/2014  . Thrombocytopenia ([place]) 09/20/2013  . Anemia 09/20/2013  . Menopausal symptoms 09/20/2013  . Insomnia 09/20/2013    Jomarie Longs PT 05/12/2017, 7:53 AM  [facility] [address], [place], [address] Phone: [phone]   Fax:  [phone]  Name: JANARA KLETT MRN: [id number] Date of Birth: 16-Sep-1955

## 2017-05-11 NOTE — Telephone Encounter (Signed)
Pt early refill for zolpidem (AMBIEN) 10 MG tablet . Pt is leaving for [place] this Friday 9/21 for 3 weeks. She is needing early refill to take with her. [place] needs approval please call pharmacy

## 2017-05-11 NOTE — Telephone Encounter (Signed)
Pt called back, she has called the pharmacy, new RX on hold from 9/5, they will fill it. No reason to call her back  Thank you

## 2017-05-11 NOTE — Progress Notes (Signed)
Discussed with patient not to take Flexeril and Ambien or other sleep agent together at night.

## 2017-05-13 ENCOUNTER — Encounter: Payer: Self-pay | Admitting: Physical Therapy

## 2017-05-13 ENCOUNTER — Ambulatory Visit: Payer: 59 | Admitting: Physical Therapy

## 2017-05-13 DIAGNOSIS — M6281 Muscle weakness (generalized): Secondary | ICD-10-CM

## 2017-05-13 DIAGNOSIS — M5440 Lumbago with sciatica, unspecified side: Secondary | ICD-10-CM

## 2017-05-13 DIAGNOSIS — M62830 Muscle spasm of back: Secondary | ICD-10-CM | POA: Diagnosis not present

## 2017-05-14 NOTE — Therapy (Signed)
[facility] [address] [place], [address] Phone: [phone]   Fax:  [phone]  Physical Therapy Treatment  Patient Details  Name: Kimberly Figueroa MRN: [id number] Date of Birth: 16-Nov-1955 Referring Provider: Paulina Fusi MD  Encounter Date: 05/13/2017      PT End of Session - 05/13/17 1805    Visit Number 6   Number of Visits 12   Date for PT Re-Evaluation 06/02/17   PT Start Time 9678   PT Stop Time 1815   PT Time Calculation (min) 45 min   Activity Tolerance Patient tolerated treatment well;Patient limited by pain   Behavior During Therapy [facility] for tasks assessed/performed      Past Medical History:  Diagnosis Date  . Endometriosis   . History of frequent urinary tract infections    s/p bladder dilatation (11/05)  . Hypersomnia, recurrent 06/28/2014  . ITP (idiopathic thrombocytopenic purpura)   . Pneumonia     Past Surgical History:  Procedure Laterality Date  . bladder dilatation  11/05  . CESAREAN SECTION  1993  . COLONOSCOPY    . laproscopic surgery  10/06   found to have endometriosis  . SHOULDER SURGERY Left 2008  . TONSILLECTOMY  1961  . VAGINAL DELIVERY  1987  . VAGINAL HYSTERECTOMY  02/07    There were no vitals filed for this visit.      Subjective Assessment - 05/13/17 1731    Subjective Patient reports she felt more uncomfortable following last session. She has been getting ready for trip and is using positioning to alleviate pain in back.    Pertinent History history of epsodes of back pain in lower back since her 20's, self treatment. previous therapy for cervical spine. current episode began 09/2016 for no apparent reason.   Limitations Sitting;Standing;Walking;House hold activities;Other (comment)  bending, twisting   How long can you sit comfortably? 1 hour   How long can you stand comfortably? as long as she wants without increased pain   How long can you  walk comfortably? unable to walk long distances   Diagnostic tests MRI: per report: bulging disc L4-5, central protrusion os disc, mild L5-S1   Patient Stated Goals decrease pain, return to prior level of function   Currently in Pain? Yes   Pain Score 1    Pain Location Back   Pain Orientation Lower;Mid   Pain Descriptors / Indicators Aching   Pain Type Chronic pain   Pain Onset More than a month ago   Pain Frequency Intermittent        Palpation: + moderate spasms along lumbar spine, right>left; improved from previous session  Treatment: Manual therapy:15 min. Goal: pain, spasms STM to lumbar spine up to thoracic spine paraspinals and gluteal/piriformis muscleswith patient prone lying over 2pillows; superficial techniques  Modalities: Electrical stimulation: 20 min High volt estim.clincial program for muscle spasms (4) electrodes applied to lumbar spine, intensity to tolerance with patient prone lying withLE'ssupported on pillow goal: pain, spasms  Therapeutic exercise: patient performed with demonstration, instruction, verbal cues of therapist: goal: improve strength, decrease pain, independent with home program Prone lying over 2 pillows; re assessed home exercises and prone lying stabilization exercises to perform as able   Patient response to treatment: Patient demonstrated improved soft tissue elasticity with decreased spasms in back following STM/treatment. Verbalized good understanding of home program.          PT Education - 05/13/17 1753    Education  provided Yes   Education Details re assessed exercises to perfomr at home, proper body mechanics and sitting postures to avoid back symptoms   Person(s) Educated Patient   Methods Explanation;Demonstration;Verbal cues   Comprehension Verbalized understanding;Verbal cues required             PT Long Term Goals - 04/21/17 1929      PT LONG TERM GOAL #1   Title Patient will demonstrate improved  function with daily tasks and decreased back pain as indicated by MODI score of 20% or less   Baseline 34%   Status New   Target Date 06/02/17     PT LONG TERM GOAL #2   Title Patient will demonstrate improved posture awareness and pain control strategies to allow patient to sit/stand for >1hour with mild to no pain    Baseline unable to sit/stand for 1 hour without increased pain   Status New   Target Date 06/02/17     PT LONG TERM GOAL #3   Title Patient will be independent with home program for posture awareness, pain control, progressive exercises to allow patient to transition to self management once discharged from physical therapy   Baseline no knowledge of appropriate pain conrtol strategies, exercise progression without assistance, instruction   Status New   Target Date 06/02/17               Plan - 05/13/17 1800    Clinical Impression Statement Patient demonstrates steady improvement in knowledge of appropriate body mechanics and exercise progression. She is going on vacation for the next 2 weeks and when she returns will determine further physical therapy needs.    Rehab Potential Good   Clinical Impairments Affecting Rehab Potential (+)acute condition, prior level of function(-)prior history of back symptoms   PT Frequency 2x / week   PT Duration 6 weeks   PT Treatment/Interventions Iontophoresis 4mg /ml Dexamethasone;Electrical Stimulation;Cryotherapy;Moist Heat;Traction;Ultrasound;Patient/family education;Neuromuscular re-education;Therapeutic exercise;Manual techniques;Dry needling   PT Next Visit Plan pain control, core stabilization   PT Home Exercise Plan body mechanics, posture correction, use of lumbar support; home program for stabilization exercises sitting,       Patient will benefit from skilled therapeutic intervention in order to improve the following deficits and impairments:  Decreased strength, Pain, Impaired perceived functional ability, Increased  muscle spasms, Difficulty walking, Decreased range of motion  Visit Diagnosis: Muscle spasm of back  Low back pain with sciatica, sciatica laterality unspecified, unspecified back pain laterality, unspecified chronicity  Muscle weakness (generalized)     Problem List Patient Active Problem List   Diagnosis Date Noted  . Rash 09/14/2016  . Worms in stool 07/25/2016  . Travel advice encounter 11/05/2015  . Stress 06/03/2015  . Health care maintenance 11/05/2014  . Chronic insomnia 10/12/2014  . Insomnia due to medical condition 10/12/2014  . Hypersomnia, recurrent 06/28/2014  . Thrombocytopenia ([place]) 09/20/2013  . Anemia 09/20/2013  . Menopausal symptoms 09/20/2013  . Insomnia 09/20/2013    Jomarie Longs PT 05/14/2017, 11:11 PM  [facility] [address] [place], [address] Phone: [phone]   Fax:  [phone]  Name: Kimberly Figueroa MRN: [id number] Date of Birth: 15-Jan-1956

## 2017-05-18 ENCOUNTER — Encounter: Payer: 59 | Admitting: Physical Therapy

## 2017-05-20 ENCOUNTER — Encounter: Payer: 59 | Admitting: Physical Therapy

## 2017-05-20 ENCOUNTER — Encounter: Payer: Self-pay | Admitting: Physical Therapy

## 2017-05-25 ENCOUNTER — Encounter: Payer: 59 | Admitting: Physical Therapy

## 2017-05-27 ENCOUNTER — Encounter: Payer: 59 | Admitting: Physical Therapy

## 2017-06-11 ENCOUNTER — Other Ambulatory Visit: Payer: Self-pay | Admitting: Internal Medicine

## 2017-06-12 DIAGNOSIS — H0011 Chalazion right upper eyelid: Secondary | ICD-10-CM | POA: Diagnosis not present

## 2017-07-06 ENCOUNTER — Other Ambulatory Visit: Payer: Self-pay | Admitting: Internal Medicine

## 2017-07-06 NOTE — Telephone Encounter (Signed)
Patient needs f/u app made. She should have CPE. Can you call and make that appointment I have called in two refill. If you can call her Friday she will be out of town until when with work. If she will need more refills let me know and I will call them in.

## 2017-07-09 NOTE — Telephone Encounter (Signed)
Lm on vm to schedule cpe for end of march

## 2017-08-10 ENCOUNTER — Other Ambulatory Visit: Payer: Self-pay | Admitting: Neurology

## 2017-09-09 ENCOUNTER — Other Ambulatory Visit: Payer: Self-pay | Admitting: Internal Medicine

## 2017-09-09 ENCOUNTER — Other Ambulatory Visit: Payer: Self-pay | Admitting: Family Medicine

## 2017-09-09 ENCOUNTER — Other Ambulatory Visit: Payer: Self-pay | Admitting: Neurology

## 2017-09-15 ENCOUNTER — Other Ambulatory Visit: Payer: Self-pay | Admitting: *Deleted

## 2017-09-15 NOTE — Telephone Encounter (Signed)
Faxed Rx to [facility] for Progesterone 1% HRT CMP- Apply 55ml topically once daily for 6 days out of the week.  #30 with 2 refills

## 2017-09-23 ENCOUNTER — Encounter: Payer: Self-pay | Admitting: Internal Medicine

## 2017-10-02 ENCOUNTER — Encounter: Payer: Self-pay | Admitting: Internal Medicine

## 2017-10-02 ENCOUNTER — Ambulatory Visit (INDEPENDENT_AMBULATORY_CARE_PROVIDER_SITE_OTHER): Payer: No Typology Code available for payment source | Admitting: Internal Medicine

## 2017-10-02 VITALS — BP 110/70 | HR 62 | Temp 98.2°F | Resp 17 | Ht 66.1 in | Wt 149.2 lb

## 2017-10-02 DIAGNOSIS — M545 Low back pain: Secondary | ICD-10-CM | POA: Diagnosis not present

## 2017-10-02 DIAGNOSIS — F439 Reaction to severe stress, unspecified: Secondary | ICD-10-CM | POA: Diagnosis not present

## 2017-10-02 DIAGNOSIS — Z1231 Encounter for screening mammogram for malignant neoplasm of breast: Secondary | ICD-10-CM

## 2017-10-02 DIAGNOSIS — F5104 Psychophysiologic insomnia: Secondary | ICD-10-CM | POA: Diagnosis not present

## 2017-10-02 DIAGNOSIS — Z0000 Encounter for general adult medical examination without abnormal findings: Secondary | ICD-10-CM

## 2017-10-02 DIAGNOSIS — N951 Menopausal and female climacteric states: Secondary | ICD-10-CM

## 2017-10-02 DIAGNOSIS — D696 Thrombocytopenia, unspecified: Secondary | ICD-10-CM

## 2017-10-02 DIAGNOSIS — R739 Hyperglycemia, unspecified: Secondary | ICD-10-CM | POA: Diagnosis not present

## 2017-10-02 NOTE — Progress Notes (Signed)
Patient ID: Kimberly Figueroa, female   DOB: 12/30/1955, 62 y.o.   MRN: [id number]   Subjective:    Patient ID: Kimberly Figueroa, female    DOB: 10-31-1955, 62 y.o.   MRN: [id number]  HPI  Patient with past history of ITP and sleep issues.  She comes in today to follow up on these issues as well as for a complete physical exam.  She reports she is doing relatively well.  Has been having issues with her back.  Flares intermittently.  Saw Dr Posey Pronto.  Given flexeril and meloxicam.  MRI obtained as outlined.  Went to therapy. Has used TENS. Started yoga.  Got a weighted blanket.  Is better.  Given exercises to do at home.  Has appt with Dr Arnoldo Morale next week.  Tries to stay active.  No chest pain.  Breathing stable. No acid reflux.  No abdominal pain.  Bowels moving.  No urine change.  Handling stress.  Still seeing neurology for her sleep issues.     Past Medical History:  Diagnosis Date  . Endometriosis   . History of frequent urinary tract infections    s/p bladder dilatation (11/05)  . Hypersomnia, recurrent 06/28/2014  . ITP (idiopathic thrombocytopenic purpura)   . Pneumonia    Past Surgical History:  Procedure Laterality Date  . bladder dilatation  11/05  . CESAREAN SECTION  1993  . COLONOSCOPY    . laproscopic surgery  10/06   found to have endometriosis  . SHOULDER SURGERY Left 2008  . TONSILLECTOMY  1961  . VAGINAL DELIVERY  1987  . VAGINAL HYSTERECTOMY  02/07   Family History  Problem Relation Age of Onset  . Alcohol abuse Mother   . Varicose Veins Mother   . Diabetes Father   . Hearing loss Father   . Heart disease Father   . Crohn's disease Father   . Crohn's disease Paternal Grandfather   . Breast cancer Neg Hx   . Colon cancer Neg Hx    Social History   Socioeconomic History  . Marital status: Married    Spouse name: Gwyndolyn Saxon  . Number of children: 2  . Years of education: college  . Highest education level: None  Social Needs  . Financial resource  strain: None  . Food insecurity - worry: None  . Food insecurity - inability: None  . Transportation needs - medical: None  . Transportation needs - non-medical: None  Occupational History  . Occupation: Advertising account planner. Director    Employer: [employer]  Tobacco Use  . 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 Smokeless tobacco: Never Used  Substance and Sexual Activity  . Alcohol use: No    Alcohol/week: 0.0 oz  . Drug use: No  . Sexual activity: None  Other Topics Concern  . None  Social History Narrative   Patient consumes 6-8 cups tea daily, is right handed    Outpatient Encounter Medications as of 10/02/2017  Medication Sig  . citalopram (CELEXA) 20 MG tablet TAKE 1 & 1/2 TABLETS BY MOUTH DAILY  . cyclobenzaprine (FLEXERIL) 5 MG tablet TAKE 1 TABLET BY MOUTH AT BEDTIME AS NEEDED FOR MUSCLE SPASM. CAN CAUSE DROWSINESS.  Marland Kitchen EPINEPHrine (EPIPEN 2-PAK) 0.3 mg/0.3 mL IJ SOAJ injection Inject 0.3 mLs (0.3 mg total) into the muscle once.  . fluticasone (FLONASE) 50 MCG/ACT nasal spray   . meloxicam (MOBIC) 15 MG tablet Take 1 tablet (15 mg total) by mouth daily. For pain as needed.  . metroNIDAZOLE (METROCREAM) 0.75 %  cream 2 (two) times daily.  . Misc Natural Products (PROGESTERONE EX) Apply 1 mL topically daily. For 6 days out of the week.  . modafinil (PROVIGIL) 100 MG tablet TAKE 1 TABLET BY MOUTH AS NEEDED  . Multiple Vitamins-Minerals (MULTIVITAMIN GUMMIES ADULTS PO) Take by mouth. VitaCraves  . Progesterone Micronized 10 % CREA Place onto the skin.  Marland Kitchen zolpidem (AMBIEN) 10 MG tablet TAKE 1/2 TO 1 TABLET BY MOUTH AT NIGHT AS NEEDED   No facility-administered encounter medications on file as of 10/02/2017.     Review of Systems  Constitutional: Negative for appetite change and unexpected weight change.  HENT: Negative for congestion and sinus pressure.   Eyes: Negative for pain and visual disturbance.  Respiratory: Negative for cough, chest tightness and shortness of breath.   Cardiovascular:  Negative for chest pain, palpitations and leg swelling.  Gastrointestinal: Negative for abdominal pain, diarrhea, nausea and vomiting.  Genitourinary: Negative for difficulty urinating and dysuria.  Musculoskeletal: Positive for back pain. Negative for joint swelling.  Skin: Negative for color change and rash.  Neurological: Negative for dizziness, light-headedness and headaches.  Hematological: Negative for adenopathy. Does not bruise/bleed easily.  Psychiatric/Behavioral: Negative for agitation and dysphoric mood.       Objective:     Blood pressure rechecked by me:  110/68  Physical Exam  Constitutional: She is oriented to person, place, and time. She appears well-developed and well-nourished. No distress.  HENT:  Nose: Nose normal.  Mouth/Throat: Oropharynx is clear and moist.  Eyes: Right eye exhibits no discharge. Left eye exhibits no discharge. No scleral icterus.  Neck: Neck supple. No thyromegaly present.  Cardiovascular: Normal rate and regular rhythm.  Pulmonary/Chest: Breath sounds normal. No accessory muscle usage. No tachypnea. No respiratory distress. She has no decreased breath sounds. She has no wheezes. She has no rhonchi. Right breast exhibits no inverted nipple, no mass, no nipple discharge and no tenderness (no axillary adenopathy). Left breast exhibits no inverted nipple, no mass, no nipple discharge and no tenderness (no axilarry adenopathy).  Abdominal: Soft. Bowel sounds are normal. There is no tenderness.  Musculoskeletal: She exhibits no edema or tenderness.  Lymphadenopathy:    She has no cervical adenopathy.  Neurological: She is alert and oriented to person, place, and time.  Skin: Skin is warm. No rash noted. No erythema.  Psychiatric: She has a normal mood and affect. Her behavior is normal.    BP 110/70   Pulse 62   Temp 98.2 F (36.8 C) (Oral)   Resp 17   Ht 5' 6.1" (1.679 m)   Wt 149 lb 4 oz (67.7 kg)   SpO2 98%   BMI 24.02 kg/m  Wt  Readings from Last 3 Encounters:  10/02/17 149 lb 4 oz (67.7 kg)  03/03/17 149 lb (67.6 kg)  09/12/16 148 lb 12.8 oz (67.5 kg)     Lab Results  Component Value Date   WBC 4.0 09/18/2016   HGB 12.1 09/18/2016   HCT 36.3 09/18/2016   PLT 125.0 (L) 09/18/2016   GLUCOSE 87 09/18/2016   CHOL 175 09/18/2016   TRIG 60.0 09/18/2016   HDL 69.50 09/18/2016   LDLCALC 94 09/18/2016   ALT 17 09/18/2016   AST 18 09/18/2016   NA 142 09/18/2016   K 3.8 09/18/2016   CL 105 09/18/2016   CREATININE 0.58 09/18/2016   BUN 15 09/18/2016   CO2 32 09/18/2016   TSH 1.65 09/18/2016   HGBA1C 6.1 05/29/2015    Mr  Lumbar Spine Wo Contrast  Result Date: 04/17/2017 CLINICAL DATA:  Pt states history of low-level back pain. LBP/sacral pain radiating into both hips and upper thighs x 1 week. Happened before in February 2018 and lasted for about a week. NKI. EXAM: MRI LUMBAR SPINE WITHOUT CONTRAST TECHNIQUE: Multiplanar, multisequence MR imaging of the lumbar spine was performed. No intravenous contrast was administered. COMPARISON:  None. FINDINGS: Segmentation:  Standard. Alignment:  Physiologic. Vertebrae:  No fracture, evidence of discitis, or bone lesion. Conus medullaris: Extends to the T12 level and appears normal. Paraspinal and other soft tissues: No paraspinal abnormality. Disc levels: Disc spaces: Disc desiccation with minimal disc height loss at L5-S1. T12-L1: No significant disc bulge. No evidence of neural foraminal stenosis. No central canal stenosis. L1-L2: No significant disc bulge. No evidence of neural foraminal stenosis. No central canal stenosis. L2-L3: No significant disc bulge. No evidence of neural foraminal stenosis. No central canal stenosis. L3-L4: No significant disc bulge. No evidence of neural foraminal stenosis. No central canal stenosis. L4-L5: Minimal broad-based disc bulge. No evidence of neural foraminal stenosis. No central canal stenosis. L5-S1: Small central disc protrusion. No  evidence of neural foraminal stenosis. No central canal stenosis. IMPRESSION: 1. Small central disc protrusion at L5-S1. 2. Mild broad-based disc bulge at L4-5. Electronically Signed   By: Kathreen Devoid   On: 04/17/2017 12:35       Assessment & Plan:   Problem List Items Addressed This Visit    Back pain    Persistent intermittent flares.  Had MRI. Has been to therapy.  Exercises.  Has started yoga.  Has appt with Dr Arnoldo Morale next week.        Chronic insomnia    Followed by neurology.  They have her using ambien.  Follow.       Health care maintenance    Physical today 10/02/17. S/p hysterectomy.   Scheduled mammogram.  Colonoscopy 01/2014 - normal.        Menopausal symptoms    Stable on citalopram.  Follow.       Stress    Discussed with her today.  Overall feels she is doing relatively well.  Follow.  On citalopram.       Thrombocytopenia (HCC)    Has ITP.  Worked up by hematology.  Follow cbc.       Relevant Orders   CBC with Differential/Platelet    Other Visit Diagnoses    Breast cancer screening by mammogram    -  Primary   Relevant Orders   MM Digital Screening   Hyperglycemia       Relevant Orders   Hemoglobin A1c   Hepatic function panel   Lipid panel   TSH   Basic metabolic panel       Einar Pheasant, MD

## 2017-10-02 NOTE — Assessment & Plan Note (Signed)
Physical today 10/02/17. S/p hysterectomy.   Scheduled mammogram.  Colonoscopy 01/2014 - normal.

## 2017-10-05 ENCOUNTER — Encounter: Payer: Self-pay | Admitting: Internal Medicine

## 2017-10-05 DIAGNOSIS — M549 Dorsalgia, unspecified: Secondary | ICD-10-CM | POA: Insufficient documentation

## 2017-10-05 NOTE — Assessment & Plan Note (Signed)
Has ITP.  Worked up by hematology.  Follow cbc.   

## 2017-10-05 NOTE — Assessment & Plan Note (Signed)
Stable on citalopram.  Follow.   

## 2017-10-05 NOTE — Assessment & Plan Note (Signed)
Discussed with her today.  Overall feels she is doing relatively well.  Follow.  On citalopram.

## 2017-10-05 NOTE — Assessment & Plan Note (Signed)
Persistent intermittent flares.  Had MRI. Has been to therapy.  Exercises.  Has started yoga.  Has appt with Dr Arnoldo Morale next week.

## 2017-10-05 NOTE — Assessment & Plan Note (Signed)
Followed by neurology.  They have her using ambien.  Follow.

## 2017-10-07 ENCOUNTER — Other Ambulatory Visit: Payer: Self-pay | Admitting: Internal Medicine

## 2017-10-08 ENCOUNTER — Other Ambulatory Visit: Payer: Self-pay

## 2017-10-20 LAB — HM MAMMOGRAPHY

## 2017-10-29 ENCOUNTER — Other Ambulatory Visit: Payer: Self-pay | Admitting: *Deleted

## 2017-10-29 NOTE — Telephone Encounter (Signed)
Pt now uses [facility]. Requesting a refill on Progesterone cream. Need strength, directions, & refills send to [facility].

## 2017-10-30 NOTE — Telephone Encounter (Signed)
This medication was a compounded medication.  They compounded this at [facility]  They would fax over rx for me to sign with their directions.  Please talk with Clair Gulling at [place] .  (he was a Software engineer at [employer]).  See if he can fax over rx needed to sign.

## 2017-11-02 NOTE — Telephone Encounter (Signed)
Routed to CMA 

## 2017-11-12 ENCOUNTER — Ambulatory Visit: Payer: Self-pay

## 2017-11-12 ENCOUNTER — Telehealth: Payer: Self-pay

## 2017-11-12 DIAGNOSIS — R002 Palpitations: Secondary | ICD-10-CM

## 2017-11-12 NOTE — Telephone Encounter (Signed)
Patient returned call and stated that she was seen at [facility] clinic walk in and had an EKG. She states that she was sent to Dr. Bethanne Ginger office for a holter monitor. She will need to wear it for the next week but states insurance will need her to have a referral to see Dr. Ubaldo Glassing. I informed her that I would pass this information on to her PCP.

## 2017-11-12 NOTE — Telephone Encounter (Signed)
Please place referral

## 2017-11-12 NOTE — Telephone Encounter (Signed)
Sent you a MyChart message about this as well.

## 2017-11-12 NOTE — Telephone Encounter (Signed)
I have placed the order for the referral.   

## 2017-11-12 NOTE — Telephone Encounter (Signed)
Tried to reach patient to make sure en route to ED no answer.

## 2017-11-12 NOTE — Telephone Encounter (Signed)
Patient called in with c/o "irregular heart beat." She says "I was in a meeting and my heart started beating fast, irregular, like it was going to come out of my chest. It started 1-1.5 hours ago.  I was in a meeting with physicians and one of them told me they could see the beating in my neck and I would need to get checked out. I'm driving towards [place] now and I am willing to go to the UC, if I need to. I'm not having pain in my chest, my arm. I was a little nauseated, but I figured that was stress to what is going on. I just have the occasional  feeling to take a deep breath." I asked about dizziness, sweating, she said "no." According to protocol, see PCP within 4 hours-go to UC. I advised the patient to go to the ED or UC, she says "I'll go to the UC." Care advice given, patient verbalized understanding.   Reason for Disposition . [1] Skipped or extra beat(s) AND [2] occurs 4 or more times per minute  Answer Assessment - Initial Assessment Questions 1. DESCRIPTION: "Please describe your heart rate or heart beat that you are having" (e.g., fast/slow, regular/irregular, skipped or extra beats, "palpitations")     Fast, irregular 2. ONSET: "When did it start?" (Minutes, hours or days)      1 hour to 1.5 hours ago 3. DURATION: "How long does it last" (e.g., seconds, minutes, hours)     Lasts 1 minute or so, not long 4. PATTERN "Does it come and go, or has it been constant since it started?"  "Does it get worse with exertion?"   "Are you feeling it now?"     Comes and goes; feeling it now 5. TAP: "Using your hand, can you tap out what you are feeling on a chair or table in front of you, so that I can hear?" (Note: not all patients can do this)       N/A 6. HEART RATE: "Can you tell me your heart rate?" "How many beats in 15 seconds?"  (Note: not all patients can do this)       No 7. RECURRENT SYMPTOM: "Have you ever had this before?" If so, ask: "When was the last time?" and "What happened  that time?"      Yes about 1 year ago; sent to cardiologist had stress test 8. CAUSE: "What do you think is causing the palpitations?"      Unsure, maybe stress 9. CARDIAC HISTORY: "Do you have any history of heart disease?" (e.g., heart attack, angina, bypass surgery, angioplasty, arrhythmia)      No 10. OTHER SYMPTOMS: "Do you have any other symptoms?" (e.g., dizziness, chest pain, sweating, difficulty breathing)       Occasional feeling like I need to take a deep breath 11. PREGNANCY: "Is there any chance you are pregnant?" "When was your last menstrual period?"       No  Protocols used: HEART RATE AND HEARTBEAT QUESTIONS-A-AH

## 2017-11-12 NOTE — Telephone Encounter (Signed)
Patient was seen at [facility] clinic and then went Dr. Ubaldo Glassing office for halter monitor. Patient need referral because of new insurance.

## 2017-11-12 NOTE — Telephone Encounter (Signed)
Called and left voicemail for patient to call office back. Was calling to see if she had made it to the Urgent care or the ED. Patient was triaged and informed to go to urgent care and she stated that she would. FYI

## 2017-11-13 NOTE — Telephone Encounter (Signed)
Patient aware.

## 2017-11-16 ENCOUNTER — Other Ambulatory Visit: Payer: Self-pay | Admitting: Internal Medicine

## 2017-12-01 DIAGNOSIS — I499 Cardiac arrhythmia, unspecified: Secondary | ICD-10-CM | POA: Insufficient documentation

## 2017-12-03 ENCOUNTER — Telehealth: Payer: Self-pay | Admitting: Internal Medicine

## 2017-12-03 NOTE — Telephone Encounter (Signed)
Copied from [place] [id number]. Topic: Quick Communication - Rx Refill/Question >> Dec 03, 2017  9:14 AM Scherrie Gerlach wrote: Medication:Progesterone Micronized 10 % CREAm  Pt states she was advised to call next time she needed this rx It is a compound med. [facility] in [place], [place]

## 2017-12-04 NOTE — Telephone Encounter (Signed)
Left message on voice mail letting pt know that I have called her medication into [facility]

## 2017-12-14 ENCOUNTER — Other Ambulatory Visit: Payer: Self-pay | Admitting: Internal Medicine

## 2018-01-19 ENCOUNTER — Other Ambulatory Visit: Payer: Self-pay | Admitting: Internal Medicine

## 2018-01-26 ENCOUNTER — Other Ambulatory Visit: Payer: Self-pay | Admitting: Internal Medicine

## 2018-01-28 NOTE — Telephone Encounter (Signed)
Called to pharmacy 

## 2018-02-10 ENCOUNTER — Other Ambulatory Visit: Payer: Self-pay | Admitting: Internal Medicine

## 2018-03-16 ENCOUNTER — Other Ambulatory Visit: Payer: Self-pay | Admitting: Neurology

## 2018-03-16 ENCOUNTER — Other Ambulatory Visit: Payer: Self-pay | Admitting: Internal Medicine

## 2018-03-22 ENCOUNTER — Encounter: Payer: Self-pay | Admitting: Internal Medicine

## 2018-03-22 ENCOUNTER — Other Ambulatory Visit: Payer: Self-pay | Admitting: Internal Medicine

## 2018-03-23 ENCOUNTER — Other Ambulatory Visit: Payer: Self-pay

## 2018-03-23 MED ORDER — EPINEPHRINE 0.3 MG/0.3ML IJ SOAJ: 0.3000 mg | Freq: Once | INTRAMUSCULAR | 1 refills | Status: DC

## 2018-04-08 ENCOUNTER — Encounter: Payer: Self-pay | Admitting: Internal Medicine

## 2018-04-08 ENCOUNTER — Ambulatory Visit (INDEPENDENT_AMBULATORY_CARE_PROVIDER_SITE_OTHER): Payer: No Typology Code available for payment source | Admitting: Internal Medicine

## 2018-04-08 DIAGNOSIS — F5104 Psychophysiologic insomnia: Secondary | ICD-10-CM | POA: Diagnosis not present

## 2018-04-08 DIAGNOSIS — F439 Reaction to severe stress, unspecified: Secondary | ICD-10-CM

## 2018-04-08 DIAGNOSIS — N951 Menopausal and female climacteric states: Secondary | ICD-10-CM | POA: Diagnosis not present

## 2018-04-08 DIAGNOSIS — D696 Thrombocytopenia, unspecified: Secondary | ICD-10-CM

## 2018-04-08 DIAGNOSIS — D638 Anemia in other chronic diseases classified elsewhere: Secondary | ICD-10-CM

## 2018-04-08 DIAGNOSIS — M545 Low back pain: Secondary | ICD-10-CM

## 2018-04-08 DIAGNOSIS — L719 Rosacea, unspecified: Secondary | ICD-10-CM

## 2018-04-08 NOTE — Progress Notes (Signed)
Patient ID: Kimberly Figueroa, female   DOB: 1955/09/15, 63 y.o.   MRN: [id number]   Subjective:    Patient ID: Kimberly Figueroa, female    DOB: 1956/07/23, 62 y.o.   MRN: [id number]  HPI  Patient here for a scheduled follow up.  She was recently evaluated by cardiology.  S/p holter - revealed SR with occasional PVCs and PACs.  Recommended avoiding stimulants.  Felt no further w/up warranted.  No chest pain.  Tries to stay active.  Breathing stable.  She is planning to f/u with a specialist for rosacea.  Request referral to Dr Harriett Sine.  No acid reflux reported.  No abdominal pain.  Some constipation.  May be related to pain medication.  Probiotics help.  Persistent back pain.  Has seen neurosurgery.  Seeing pain clinic.  Insurance denied epidural.  Discussed the need to continue f/u with pain clinic for continued chronic pain medications.  TENS unit helps.  Has noticed more hot flashes.  Taking ambien.     Past Medical History:  Diagnosis Date  . Endometriosis   . History of frequent urinary tract infections    s/p bladder dilatation (11/05)  . Hypersomnia, recurrent 06/28/2014  . ITP (idiopathic thrombocytopenic purpura)   . Pneumonia    Past Surgical History:  Procedure Laterality Date  . bladder dilatation  11/05  . CESAREAN SECTION  1993  . COLONOSCOPY    . laproscopic surgery  10/06   found to have endometriosis  . SHOULDER SURGERY Left 2008  . TONSILLECTOMY  1961  . VAGINAL DELIVERY  1987  . VAGINAL HYSTERECTOMY  02/07   Family History  Problem Relation Age of Onset  . Alcohol abuse Mother   . Varicose Veins Mother   . Diabetes Father   . Hearing loss Father   . Heart disease Father   . Crohn's disease Father   . Crohn's disease Paternal Grandfather   . Breast cancer Neg Hx   . Colon cancer Neg Hx    Social History   Socioeconomic History  . Marital status: Married    Spouse name: Gwyndolyn Saxon  . Number of children: 2  . Years of education: college  .  Highest education level: Not on file  Occupational History  . Occupation: Advertising account planner. Director    Employer: [employer]  Social Needs  . Financial resource strain: Not on file  . Food insecurity:    Worry: Not on file    Inability: Not on file  . Transportation needs:    Medical: Not on file    Non-medical: Not on file  Tobacco Use  . 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 Smokeless tobacco: Never Used  Substance and Sexual Activity  . Alcohol use: No    Alcohol/week: 0.0 standard drinks  . Drug use: No  . Sexual activity: Not on file  Lifestyle  . Physical activity:    Days per week: Not on file    Minutes per session: Not on file  . Stress: Not on file  Relationships  . Social connections:    Talks on phone: Not on file    Gets together: Not on file    Attends religious service: Not on file    Active member of club or organization: Not on file    Attends meetings of clubs or organizations: Not on file    Relationship status: Not on file  Other Topics Concern  . Not on file  Social History Narrative   Patient  consumes 6-8 cups tea daily, is right handed    Outpatient Encounter Medications as of 04/08/2018  Medication Sig  . citalopram (CELEXA) 20 MG tablet TAKE 1 & 1/2 TABLETS BY MOUTH DAILY  . cyclobenzaprine (FLEXERIL) 5 MG tablet TAKE 1 TABLET BY MOUTH AT BEDTIME AS NEEDED FOR MUSCLE SPASM. CAN CAUSE DROWSINESS.  . fluticasone (FLONASE) 50 MCG/ACT nasal spray   . meloxicam (MOBIC) 15 MG tablet Take 1 tablet (15 mg total) by mouth daily. For pain as needed.  . metroNIDAZOLE (METROCREAM) 0.75 % cream 2 (two) times daily.  . Misc Natural Products (PROGESTERONE EX) Apply 1 mL topically daily. For 6 days out of the week.  . modafinil (PROVIGIL) 100 MG tablet TAKE 1 TABLET BY MOUTH AS NEEDED  . Multiple Vitamins-Minerals (MULTIVITAMIN GUMMIES ADULTS PO) Take by mouth. VitaCraves  . Progesterone Micronized 10 % CREA Place onto the skin.  Marland Kitchen zolpidem (AMBIEN) 10 MG tablet TAKE 1/2 TO 1  TABLET AT BEDTIME AS NEEDED   No facility-administered encounter medications on file as of 04/08/2018.     Review of Systems  Constitutional: Negative for appetite change and unexpected weight change.  HENT: Negative for congestion and sinus pressure.   Respiratory: Negative for cough, chest tightness and shortness of breath.   Cardiovascular: Negative for chest pain, palpitations and leg swelling.  Gastrointestinal: Positive for constipation. Negative for abdominal pain, diarrhea, nausea and vomiting.  Endocrine:       Hot flashes.   Genitourinary: Negative for difficulty urinating and dysuria.  Musculoskeletal: Positive for back pain. Negative for joint swelling.  Skin: Negative for color change and rash.  Neurological: Negative for dizziness, light-headedness and headaches.  Psychiatric/Behavioral: Positive for sleep disturbance. Negative for agitation and dysphoric mood.       Objective:     Blood pressure rechecked by me:  118/68  Physical Exam  Constitutional: She appears well-developed and well-nourished. No distress.  HENT:  Nose: Nose normal.  Mouth/Throat: Oropharynx is clear and moist.  Neck: Neck supple. No thyromegaly present.  Cardiovascular: Normal rate and regular rhythm.  Pulmonary/Chest: Breath sounds normal. No respiratory distress. She has no wheezes.  Abdominal: Soft. Bowel sounds are normal. There is no tenderness.  Musculoskeletal: She exhibits no edema or tenderness.  Lymphadenopathy:    She has no cervical adenopathy.  Skin: No rash noted. No erythema.  Psychiatric: She has a normal mood and affect. Her behavior is normal.    BP 106/76   Pulse 64   Temp 98.6 F (37 C) (Oral)   Ht 5\' 6"  (1.676 m)   Wt 152 lb (68.9 kg)   SpO2 97%   BMI 24.53 kg/m  Wt Readings from Last 3 Encounters:  04/08/18 152 lb (68.9 kg)  10/02/17 149 lb 4 oz (67.7 kg)  03/03/17 149 lb (67.6 kg)     Lab Results  Component Value Date   WBC 4.0 09/18/2016   HGB 12.1  09/18/2016   HCT 36.3 09/18/2016   PLT 125.0 (L) 09/18/2016   GLUCOSE 87 09/18/2016   CHOL 175 09/18/2016   TRIG 60.0 09/18/2016   HDL 69.50 09/18/2016   LDLCALC 94 09/18/2016   ALT 17 09/18/2016   AST 18 09/18/2016   NA 142 09/18/2016   K 3.8 09/18/2016   CL 105 09/18/2016   CREATININE 0.58 09/18/2016   BUN 15 09/18/2016   CO2 32 09/18/2016   TSH 1.65 09/18/2016   HGBA1C 6.1 05/29/2015    Mr Lumbar Spine Wo Contrast  Result Date: 04/17/2017 CLINICAL DATA:  Pt states history of low-level back pain. LBP/sacral pain radiating into both hips and upper thighs x 1 week. Happened before in February 2018 and lasted for about a week. NKI. EXAM: MRI LUMBAR SPINE WITHOUT CONTRAST TECHNIQUE: Multiplanar, multisequence MR imaging of the lumbar spine was performed. No intravenous contrast was administered. COMPARISON:  None. FINDINGS: Segmentation:  Standard. Alignment:  Physiologic. Vertebrae:  No fracture, evidence of discitis, or bone lesion. Conus medullaris: Extends to the T12 level and appears normal. Paraspinal and other soft tissues: No paraspinal abnormality. Disc levels: Disc spaces: Disc desiccation with minimal disc height loss at L5-S1. T12-L1: No significant disc bulge. No evidence of neural foraminal stenosis. No central canal stenosis. L1-L2: No significant disc bulge. No evidence of neural foraminal stenosis. No central canal stenosis. L2-L3: No significant disc bulge. No evidence of neural foraminal stenosis. No central canal stenosis. L3-L4: No significant disc bulge. No evidence of neural foraminal stenosis. No central canal stenosis. L4-L5: Minimal broad-based disc bulge. No evidence of neural foraminal stenosis. No central canal stenosis. L5-S1: Small central disc protrusion. No evidence of neural foraminal stenosis. No central canal stenosis. IMPRESSION: 1. Small central disc protrusion at L5-S1. 2. Mild broad-based disc bulge at L4-5. Electronically Signed   By: Kathreen Devoid   On:  04/17/2017 12:35       Assessment & Plan:   Problem List Items Addressed This Visit    Anemia    Follow cbc.       Back pain    Persistent.  Had MRI.  Saw neurosurgery.  Followed by pain clinic.  Plans to f/u with pain clinic.        Chronic insomnia    Has ben followed by neurology.  Using [place].  Follow.       Menopausal symptoms    On citalopram.  Has felt like it has helped previously.  Follow.        Rosacea    Request referral to Dr Elvera Lennox.        Relevant Orders   Ambulatory referral to Dermatology   Stress    Increased stress.  Discussed with her today.  On citalopram.  Does not feel needs anything more at this time.  Follow.        Thrombocytopenia (HCC)    Has ITP.  Worked up by hematology.  Follow cbc.            Einar Pheasant, MD

## 2018-04-11 ENCOUNTER — Encounter: Payer: Self-pay | Admitting: Internal Medicine

## 2018-04-11 DIAGNOSIS — L719 Rosacea, unspecified: Secondary | ICD-10-CM | POA: Insufficient documentation

## 2018-04-11 NOTE — Assessment & Plan Note (Signed)
Persistent.  Had MRI.  Saw neurosurgery.  Followed by pain clinic.  Plans to f/u with pain clinic.

## 2018-04-11 NOTE — Assessment & Plan Note (Signed)
Follow cbc.  

## 2018-04-11 NOTE — Assessment & Plan Note (Addendum)
Has ben followed by neurology.  Using [place].  Follow.

## 2018-04-11 NOTE — Assessment & Plan Note (Signed)
Request referral to Dr Elvera Lennox.

## 2018-04-11 NOTE — Assessment & Plan Note (Signed)
Increased stress.  Discussed with her today.  On citalopram.  Does not feel needs anything more at this time.  Follow.

## 2018-04-11 NOTE — Assessment & Plan Note (Signed)
On citalopram.  Has felt like it has helped previously.  Follow.

## 2018-04-11 NOTE — Assessment & Plan Note (Signed)
Has ITP.  Worked up by hematology.  Follow cbc.

## 2018-04-27 ENCOUNTER — Other Ambulatory Visit: Payer: Self-pay | Admitting: Internal Medicine

## 2018-04-29 ENCOUNTER — Other Ambulatory Visit (INDEPENDENT_AMBULATORY_CARE_PROVIDER_SITE_OTHER): Payer: No Typology Code available for payment source

## 2018-04-29 DIAGNOSIS — D696 Thrombocytopenia, unspecified: Secondary | ICD-10-CM | POA: Diagnosis not present

## 2018-04-29 DIAGNOSIS — R739 Hyperglycemia, unspecified: Secondary | ICD-10-CM

## 2018-04-29 LAB — CBC WITH DIFFERENTIAL/PLATELET
BASOS ABS: 0 10*3/uL (ref 0.0–0.1)
Basophils Relative: 1.1 % (ref 0.0–3.0)
EOS PCT: 3.7 % (ref 0.0–5.0)
Eosinophils Absolute: 0.1 10*3/uL (ref 0.0–0.7)
HEMATOCRIT: 39.2 % (ref 36.0–46.0)
HEMOGLOBIN: 13 g/dL (ref 12.0–15.0)
LYMPHS ABS: 1.3 10*3/uL (ref 0.7–4.0)
LYMPHS PCT: 34.5 % (ref 12.0–46.0)
MCHC: 33.1 g/dL (ref 30.0–36.0)
MCV: 91.4 fl (ref 78.0–100.0)
MONOS PCT: 7.4 % (ref 3.0–12.0)
Monocytes Absolute: 0.3 10*3/uL (ref 0.1–1.0)
NEUTROS PCT: 53.3 % (ref 43.0–77.0)
Neutro Abs: 2 10*3/uL (ref 1.4–7.7)
Platelets: 147 10*3/uL — ABNORMAL LOW (ref 150.0–400.0)
RBC: 4.29 Mil/uL (ref 3.87–5.11)
RDW: 14.1 % (ref 11.5–15.5)
WBC: 3.8 10*3/uL — ABNORMAL LOW (ref 4.0–10.5)

## 2018-04-29 LAB — HEPATIC FUNCTION PANEL
ALT: 14 U/L (ref 0–35)
AST: 19 U/L (ref 0–37)
Albumin: 4.3 g/dL (ref 3.5–5.2)
Alkaline Phosphatase: 72 U/L (ref 39–117)
BILIRUBIN TOTAL: 0.4 mg/dL (ref 0.2–1.2)
Bilirubin, Direct: 0.1 mg/dL (ref 0.0–0.3)
Total Protein: 6.7 g/dL (ref 6.0–8.3)

## 2018-04-29 LAB — LIPID PANEL
CHOL/HDL RATIO: 3
Cholesterol: 171 mg/dL (ref 0–200)
HDL: 67 mg/dL (ref >39.00)
LDL Cholesterol: 94 mg/dL (ref 0–99)
NONHDL: 103.93
Triglycerides: 52 mg/dL (ref 0.0–149.0)
VLDL: 10.4 mg/dL (ref 0.0–40.0)

## 2018-04-29 LAB — BASIC METABOLIC PANEL
BUN: 13 mg/dL (ref 6–23)
CHLORIDE: 105 meq/L (ref 96–112)
CO2: 31 mEq/L (ref 19–32)
CREATININE: 0.68 mg/dL (ref 0.40–1.20)
Calcium: 9 mg/dL (ref 8.4–10.5)
GFR: 93.75 mL/min (ref >60.00)
GLUCOSE: 96 mg/dL (ref 70–99)
Potassium: 4.2 mEq/L (ref 3.5–5.1)
Sodium: 142 mEq/L (ref 135–145)

## 2018-04-29 LAB — HEMOGLOBIN A1C: HEMOGLOBIN A1C: 6.2 % (ref 4.6–6.5)

## 2018-04-29 LAB — TSH: TSH: 2.72 u[IU]/mL (ref 0.35–4.50)

## 2018-04-30 ENCOUNTER — Other Ambulatory Visit: Payer: Self-pay | Admitting: Internal Medicine

## 2018-04-30 DIAGNOSIS — D696 Thrombocytopenia, unspecified: Secondary | ICD-10-CM

## 2018-04-30 DIAGNOSIS — R739 Hyperglycemia, unspecified: Secondary | ICD-10-CM

## 2018-04-30 NOTE — Progress Notes (Signed)
Orders placed for f/u labs.  

## 2018-05-03 ENCOUNTER — Other Ambulatory Visit: Payer: Self-pay | Admitting: Internal Medicine

## 2018-05-03 DIAGNOSIS — R739 Hyperglycemia, unspecified: Secondary | ICD-10-CM

## 2018-05-03 NOTE — Progress Notes (Signed)
Order placed for nutrition referral.

## 2018-05-04 ENCOUNTER — Telehealth: Payer: Self-pay | Admitting: Internal Medicine

## 2018-05-04 ENCOUNTER — Encounter: Payer: Self-pay | Admitting: Internal Medicine

## 2018-05-04 NOTE — Telephone Encounter (Signed)
Copied from [facility] [id number]. Topic: Quick Communication - Rx Refill/Question >> May 04, 2018  9:17 AM Bea Graff, NT wrote: Medication: Progesterone Micronized 10 % CREA   Has the patient contacted their pharmacy? Yes.   (Agent: If no, request that the patient contact the pharmacy for the refill.) (Agent: If yes, when and what did the pharmacy advise?)  Preferred Pharmacy (with phone number or street name): [facility], [place] - [place] [phone] (Phone) [phone] (Fax)    Agent: Please be advised that RX refills may take up to 3 business days. We ask that you follow-up with your pharmacy.

## 2018-05-04 NOTE — Telephone Encounter (Signed)
rx called in. Patient aware of results

## 2018-05-04 NOTE — Telephone Encounter (Signed)
Pt called to get refill on Progesterone Micronized 10% cream. Per chart med is a historical med. Called pt and she stated that the med is a compounded meda and the pharmacy needs the Rx called in.

## 2018-05-04 NOTE — Telephone Encounter (Signed)
rx request 

## 2018-05-25 ENCOUNTER — Other Ambulatory Visit: Payer: Self-pay | Admitting: Internal Medicine

## 2018-05-27 ENCOUNTER — Other Ambulatory Visit: Payer: Self-pay | Admitting: Neurology

## 2018-05-28 ENCOUNTER — Encounter: Payer: Self-pay | Admitting: Internal Medicine

## 2018-05-28 ENCOUNTER — Other Ambulatory Visit: Payer: Self-pay | Admitting: Neurology

## 2018-05-28 DIAGNOSIS — G4713 Recurrent hypersomnia: Secondary | ICD-10-CM

## 2018-05-28 MED ORDER — MODAFINIL 100 MG PO TABS: ORAL_TABLET | ORAL | 2 refills | Status: DC

## 2018-05-28 NOTE — Telephone Encounter (Signed)
Pt called requesting refills for modafinil (PROVIGIL) 100 MG tablet sent to [facility]. Scheduled a follow up for next available with MD for 1/23 pt states she is ok with waiting that long- no change since last visit unless Dr. Brett Fairy needs to see her sooner(appt added to wait list)

## 2018-05-28 NOTE — Telephone Encounter (Signed)
Last office visit 03/03/17. Next office visit scheduled for 09/16/18. Sent to Dr. Brett Fairy for signature.

## 2018-06-08 NOTE — Telephone Encounter (Signed)
Order placed for neurology referral.   

## 2018-06-09 ENCOUNTER — Ambulatory Visit: Payer: Self-pay | Admitting: Dietician

## 2018-06-16 ENCOUNTER — Encounter: Payer: No Typology Code available for payment source | Attending: Internal Medicine | Admitting: Dietician

## 2018-06-16 DIAGNOSIS — R739 Hyperglycemia, unspecified: Secondary | ICD-10-CM | POA: Insufficient documentation

## 2018-06-16 DIAGNOSIS — Z713 Dietary counseling and surveillance: Secondary | ICD-10-CM | POA: Diagnosis not present

## 2018-06-16 NOTE — Progress Notes (Signed)
Medical Nutrition Therapy: Visit start time: 1761  end time: 1630  Assessment:  Diagnosis: hyperglycemia Past medical history: insomnia, perimenopause, ITP Psychosocial issues/ stress concerns: none  Preferred learning method:  . Auditory . Visual   Current weight: 148.4lbs Height: 5'7" Medications, supplements: reconciled list in medical record  Progress and evaluation: Patient reports weight gain of 10lbs in recent months and has been having difficulty losing that weight. After recent elevated HbA1C of 6.2%, she has made changes to avoid sugar-sweetened beverages and reduced intake of refined grains. She reports some missed meals, particularly midday, and often snack-style meals, eating on the go, rather than planned or balanced meals. Insomnia has been an issue for about 10 years now, with little improvement even after sleep study.    Physical activity: occasional yoga  Dietary Intake:  Usual eating pattern includes 2 meals and 1 snacks per day. Dining out frequency: 3 meals per week.  Breakfast: often late am-- cheese stick or fruit and nuts Snack: none Lunch: snack type food including fruit -- apple or tangerines, peanut butter crackers or filled pretzels. Sometimes catered meals at meetings, which include dessert.  Snack: none Supper: lean proteins, vegetables, sweet potato, some beans Snack: none Beverages: hot tea with splenda  Nutrition Care Education: Topics covered: hyperglycemia, weight control Basic nutrition: basic food groups, appropriate nutrient balance, appropriate meal and snack schedule, general nutrition guidelines    Weight control: determining reasonable weight goal/ weight loss rate, calculated energy needs for weight loss to be about 1200-1300kcal and provided guidance for 45%CHO (9 daily servings) and 25% protein (6-7oz daily).  Hyperglycemia: appropriate meal and snack schedule, appropriate carb intake and balance, healthy carb choices, plate method for  planning simple balanced meals; options for quick meals and healthy snacks; non-food factors that affect BGs such as sleep/ insomnia, physical activity, stress  Nutritional Diagnosis:  [place]-2.2 Altered nutrition-related laboratory As related to hyperglycemia.  As evidenced by patient with recent HbA1C of 6.2%.  Intervention: Instruction as noted above.   Patient has made positive lifestyle changes to improve BG control and prevent diabetes.    Set goals for further improvement in BG control and for gradual weight loss of about 10lbs.    No follow-up scheduled at this time; patient to schedule later if needed.   Education Materials given:  . General diet guidelines for Diabetes . Plate Planner with food lists . Lunch On the Go . Go Med at Midday ([email or web address]) . Snacking handout . Goals/ instructions   Learner/ who was taught:  . Patient    Level of understanding: Marland Kitchen Verbalizes/ demonstrates competency   Demonstrated degree of understanding via:   Teach back Learning barriers: . None  Willingness to learn/ readiness for change: . Eager, change in progress   Monitoring and Evaluation:  Dietary intake, exercise, BG control, and body weight      follow up: prn

## 2018-06-16 NOTE — Patient Instructions (Signed)
   Plan for a light meal midday/ afternoon.   Gradually increase daily activity by increasing steps taken, such as with walking meetings.

## 2018-06-21 ENCOUNTER — Other Ambulatory Visit: Payer: Self-pay | Admitting: Internal Medicine

## 2018-06-21 ENCOUNTER — Other Ambulatory Visit: Payer: Self-pay | Admitting: Neurology

## 2018-07-20 ENCOUNTER — Other Ambulatory Visit: Payer: Self-pay | Admitting: Internal Medicine

## 2018-07-21 ENCOUNTER — Encounter: Payer: Self-pay | Admitting: Internal Medicine

## 2018-07-21 DIAGNOSIS — M545 Low back pain, unspecified: Secondary | ICD-10-CM

## 2018-07-21 NOTE — Telephone Encounter (Signed)
Per my last note, I informed her to keep f/u with pain clinic.  Can you clarify with her who Dr Maryjean Ka is and is it a referral needed?  I am not sure that I understand.  Need to clarify what is needed.

## 2018-07-21 NOTE — Telephone Encounter (Signed)
Are we able to addend your last note to send to Dr. Maryjean Ka?

## 2018-07-26 ENCOUNTER — Encounter: Payer: Self-pay | Admitting: Internal Medicine

## 2018-07-26 NOTE — Telephone Encounter (Signed)
I have typed letter.  See her my chart message.  States needs a letter stating I wanted her to continue f/u with Dr Maryjean Ka.

## 2018-07-26 NOTE — Telephone Encounter (Signed)
Dr Clydell Hakim is the doctor that she has been seeing for pain management. He is with [facility] in [place]. Pt stated that insurance denied her last visit because her referral has expired. She is needing proof that you are recommending her to continue to be followed by him. Wasn't sure if I needed to fax your last note or if you will need to place another referral.

## 2018-07-26 NOTE — Telephone Encounter (Signed)
Order placed for neurosurgery referral 

## 2018-07-28 NOTE — Telephone Encounter (Signed)
Office note mailed to pt per pts request so she can send to her insurance company

## 2018-07-30 ENCOUNTER — Encounter: Payer: Self-pay | Admitting: Podiatry

## 2018-07-30 ENCOUNTER — Ambulatory Visit: Payer: No Typology Code available for payment source | Admitting: Podiatry

## 2018-07-30 ENCOUNTER — Telehealth: Payer: Self-pay | Admitting: *Deleted

## 2018-07-30 VITALS — BP 108/61

## 2018-07-30 DIAGNOSIS — B351 Tinea unguium: Secondary | ICD-10-CM

## 2018-07-30 DIAGNOSIS — Z79899 Other long term (current) drug therapy: Secondary | ICD-10-CM

## 2018-07-30 LAB — CBC WITH DIFFERENTIAL/PLATELET
BASOS PCT: 1.2 %
Basophils Absolute: 49 cells/uL (ref 0–200)
EOS ABS: 90 {cells}/uL (ref 15–500)
Eosinophils Relative: 2.2 %
HCT: 36.9 % (ref 35.0–45.0)
Hemoglobin: 12.3 g/dL (ref 11.7–15.5)
Lymphs Abs: 1091 cells/uL (ref 850–3900)
MCH: 30.1 pg (ref 27.0–33.0)
MCHC: 33.3 g/dL (ref 32.0–36.0)
MCV: 90.2 fL (ref 80.0–100.0)
MPV: 13.2 fL — AB (ref 7.5–12.5)
Monocytes Relative: 7.1 %
NEUTROS ABS: 2579 {cells}/uL (ref 1500–7800)
Neutrophils Relative %: 62.9 %
Platelets: 168 10*3/uL (ref 140–400)
RBC: 4.09 10*6/uL (ref 3.80–5.10)
RDW: 13 % (ref 11.0–15.0)
TOTAL LYMPHOCYTE: 26.6 %
WBC: 4.1 10*3/uL (ref 3.8–10.8)
WBCMIX: 291 {cells}/uL (ref 200–950)

## 2018-07-30 NOTE — Patient Instructions (Signed)

## 2018-07-30 NOTE — Telephone Encounter (Signed)
[facility] asked if DR. Galaway wanted a Hepatic function with the CBC. I instructed Anderson Malta that I would put the order in for the hepatic function.

## 2018-08-03 LAB — HEPATIC FUNCTION PANEL
AG Ratio: 1.8 (calc) (ref 1.0–2.5)
ALKALINE PHOSPHATASE (APISO): 85 U/L (ref 33–130)
ALT: 15 U/L (ref 6–29)
AST: 18 U/L (ref 10–35)
Albumin: 4.3 g/dL (ref 3.6–5.1)
BILIRUBIN INDIRECT: 0.2 mg/dL (ref 0.2–1.2)
Bilirubin, Direct: 0.1 mg/dL (ref 0.0–0.2)
Globulin: 2.4 g/dL (calc) (ref 1.9–3.7)
Total Bilirubin: 0.3 mg/dL (ref 0.2–1.2)
Total Protein: 6.7 g/dL (ref 6.1–8.1)

## 2018-08-04 ENCOUNTER — Telehealth: Payer: Self-pay | Admitting: *Deleted

## 2018-08-04 NOTE — Telephone Encounter (Signed)
I called pt and informed of Dr. Heber [place] review of results and orders. Pt states she has low platelet volume, so high is unusual for her. Pt states her PCP is Dr. Einar Pheasant.

## 2018-08-04 NOTE — Telephone Encounter (Signed)
[facility] states fax is [phone]. Faxed lab 08/02/2018 to Dr. Einar Pheasant.

## 2018-08-04 NOTE — Telephone Encounter (Signed)
-----   Message from Marzetta Board, DPM sent at 08/03/2018  2:51 AM EST ----- Regarding: Normal LFTs: May start terbinafine therapy Hi Val!  Please let Ms. Borel know her LFTs were normal. On her CBC, her MPV (mean platelet volume) was high normal, which she may want to discuss with her PCP. We can perform another CBC after her first month of Lamisil if she'd like.  If she would like to continue with the Lamisil therapy, please call in:  Terbinafine 250 mg po qd x 90 days.  Thanks!  Dr. Darnell Level.

## 2018-08-09 ENCOUNTER — Encounter: Payer: Self-pay | Admitting: Internal Medicine

## 2018-08-09 ENCOUNTER — Telehealth: Payer: Self-pay | Admitting: Podiatry

## 2018-08-09 NOTE — Telephone Encounter (Signed)
Pt seen last week and was supposed to have oral prescription sent in. Pharmacy has not received prescription. Pt calling to follow up, her pharmacy is [facility]

## 2018-08-10 ENCOUNTER — Telehealth: Payer: Self-pay | Admitting: *Deleted

## 2018-08-10 MED ORDER — TERBINAFINE HCL 250 MG PO TABS: 250.0000 mg | ORAL_TABLET | Freq: Every day | ORAL | 0 refills | Status: DC

## 2018-08-10 NOTE — Telephone Encounter (Signed)
Left message in answering 08/09/2018 Telephone Call, the oral medication was sent to [facility].

## 2018-08-10 NOTE — Telephone Encounter (Signed)
-----   Message from Marzetta Board, DPM sent at 08/09/2018  1:07 PM EST ----- Regarding: Please call prescription in for patient Oral Lamisil You may call Rx for terbinafine 250 mg tablets:  Instructions: take 1 tablet po qd x 90 days.  Thanks! JLG

## 2018-08-19 ENCOUNTER — Other Ambulatory Visit: Payer: Self-pay | Admitting: Internal Medicine

## 2018-08-29 ENCOUNTER — Encounter: Payer: Self-pay | Admitting: Podiatry

## 2018-08-29 NOTE — Progress Notes (Signed)
Subjective: Kimberly Figueroa presents today referred by Einar Pheasant, MD with cc of painful, discolored, thick toenails bilateral great toe and left 2nd digit which interfere with daily activities.  Pain is aggravated when wearing enclosed shoe gear.   Past Medical History:  Diagnosis Date  . Endometriosis   . History of frequent urinary tract infections    s/p bladder dilatation (11/05)  . Hypersomnia, recurrent 06/28/2014  . ITP (idiopathic thrombocytopenic purpura)   . Pneumonia     Patient Active Problem List   Diagnosis Date Noted  . Rosacea 04/11/2018  . Irregular heartbeat 12/01/2017  . Back pain 10/05/2017  . Worms in stool 07/25/2016  . Stress 06/03/2015  . Other specified problems related to psychosocial circumstances 06/03/2015  . Change in bowel habits 06/03/2015  . Health care maintenance 11/05/2014  . Chronic insomnia 10/12/2014  . Hypersomnia, recurrent 06/28/2014  . Thrombocytopenia ([place]) 09/20/2013  . Anemia 09/20/2013  . Menopausal symptoms 09/20/2013  . Female climacteric state 09/20/2013    Past Surgical History:  Procedure Laterality Date  . bladder dilatation  11/05  . CESAREAN SECTION  1993  . COLONOSCOPY    . laproscopic surgery  10/06   found to have endometriosis  . SHOULDER SURGERY Left 2008  . TONSILLECTOMY  1961  . VAGINAL DELIVERY  1987  . VAGINAL HYSTERECTOMY  02/07   Medications    citalopram (CELEXA) 20 MG tablet    cyclobenzaprine (FLEXERIL) 5 MG tablet    fluticasone (FLONASE) 50 MCG/ACT nasal spray     Misc Natural Products (PROGESTERONE EX)    modafinil (PROVIGIL) 100 MG tablet    Multiple Vitamins-Minerals (MULTIVITAMIN GUMMIES ADULTS PO)    Progesterone Micronized 10 % CREA     valACYclovir (VALTREX) 1000 MG tablet    valACYclovir (VALTREX) 500 MG tablet    zolpidem (AMBIEN) 10 MG tablet      Allergies  Allergen Reactions  . Darvon [Propoxyphene] Nausea Only  . Vicodin [Hydrocodone-Acetaminophen] Itching     Social History   Occupational History  . Occupation: Advertising account planner. Director    Employer: [employer]  Tobacco Use  . 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 Smokeless tobacco: Never Used  Substance and Sexual Activity  . Alcohol use: No    Alcohol/week: 0.0 standard drinks  . Drug use: No  . Sexual activity: Not on file    Family History  Problem Relation Age of Onset  . Alcohol abuse Mother   . Varicose Veins Mother   . Diabetes Father   . Hearing loss Father   . Heart disease Father   . Crohn's disease Father   . Crohn's disease Paternal Grandfather   . Breast cancer Neg Hx   . Colon cancer Neg Hx     Immunization History  Administered Date(s) Administered  . Influenza Split 05/30/2013, 06/10/2014    Review of systems: Positive Findings in bold print.  Constitutional:  chills, fatigue, fever, sweats, weight change Communication: Optometrist, sign Ecologist, hand writing, iPad/Android device Eyes: diplopia, glare,  light sensitivity, eyeglasses, blindness Ears nose mouth throat: Hard of hearing, deaf, sign language,  vertigo,   bloody nose,  rhinitis,  cold sores, snoring Cardiovascular: HTN, edema, arrhythmia, pacemaker in place, defibrillator in place,  chest pain/tightness, chronic anticoagulation, blood clot Respiratory:  difficulty breathing, denies congestion, SOB, wheezing, cough Gastrointestinal: abdominal pain, diarrhea, nausea, vomiting,  Genitourinary:  nocturia,  pain on urination,  blood in urine, Foley catheter, urinary urgency Musculoskeletal: Uses mobility aid,  cramping,  stiff joints, painful joints,  Skin: +changes in toenails, color change dryness, itchy skin, mole changes, or rash  Neurological: numbness, paresthesias, burning in feet, denies fainting,  seizure, change in speech. denies headaches, memory problems/poor historian, cerebral palsy Endocrine: diabetes, hypothyroidism, hyperthyroidism,  dry mouth, flushing, denies heat intolerance,  cold  intolerance,  excessive thirst, denies polyuria,  nocturia Hematological:  easy bleeding,  excessive bleeding, easy bruising, enlarged lymph nodes, on long term blood thinner Allergy/immunological:  hives, frequent infections, multiple drug allergies, seasonal allergies,  Psychiatric:  anxiety, depression, mood disorder, suicidal ideations, hallucinations   Objective: Vascular Examination: Capillary refill time immediate x 10 digits Dorsalis pedis and posterior tibial pulses present b/l +Digital hair x 10 digits Skin temperature gradient WNL b/l  Dermatological Examination: Skin with normal turgor, texture and tone b/l  Toenails b/l great toes and left 2nd digit discolored, thick, dystrophic with subungual debris and pain with palpation to nailbeds due to thickness of nails.  Musculoskeletal: Muscle strength 5/5 to all LE muscle groups  Neurological: Sensation intact with 10 gram monofilament  Vibratory sensation intact.  Hepatic Function Latest Ref Rng & Units 08/02/2018 04/29/2018 09/18/2016  Total Protein 6.1 - 8.1 g/dL 6.7 6.7 6.3  Albumin 3.5 - 5.2 g/dL - 4.3 4.1  AST 10 - 35 U/L _0 ALT 6 - 29 U/L _1 Alk Phosphatase 39 - 117 U/L - 72 69  Total Bilirubin 0.2 - 1.2 mg/dL 0.3 0.4 0.5  Bilirubin, Direct 0.0 - 0.2 mg/dL 0.1 0.1 -   Lab Results  Component Value Date   WBC 4.1 07/30/2018   HGB 12.3 07/30/2018   HCT 36.9 07/30/2018   MCV 90.2 07/30/2018   PLT 168 07/30/2018   Assessment: 1. Painful onychomycosis toenails 1-5 b/l    Plan: 1. Discussed onychomycosis and treatment options.  Literature dispensed on today. Discussed topical, oral and laser therapy for onychomycosis. She is interested in terbinafine oral therapy. Labwork ordered for baseline CBC/LFT. 2. CBC/LFT ordered for baseline. 3. Toenails 1-5 b/l were debrided in length and girth without iatrogenic bleeding. 4. Patient to continue soft, supportive shoe gear 5. Patient to report any pedal  injuries to medical professional immediately. 6. Follow up 3 months. Patient/POA to call should there be a concern in the interim.

## 2018-08-30 ENCOUNTER — Ambulatory Visit: Payer: Self-pay

## 2018-09-02 ENCOUNTER — Ambulatory Visit (INDEPENDENT_AMBULATORY_CARE_PROVIDER_SITE_OTHER): Payer: No Typology Code available for payment source | Admitting: Internal Medicine

## 2018-09-02 DIAGNOSIS — R739 Hyperglycemia, unspecified: Secondary | ICD-10-CM

## 2018-09-02 DIAGNOSIS — D696 Thrombocytopenia, unspecified: Secondary | ICD-10-CM | POA: Diagnosis not present

## 2018-09-02 LAB — CBC WITH DIFFERENTIAL/PLATELET
Basophils Absolute: 0 10*3/uL (ref 0.0–0.1)
Basophils Relative: 1 % (ref 0.0–3.0)
Eosinophils Absolute: 0.1 10*3/uL (ref 0.0–0.7)
Eosinophils Relative: 2 % (ref 0.0–5.0)
HCT: 39.8 % (ref 36.0–46.0)
Hemoglobin: 13.1 g/dL (ref 12.0–15.0)
Lymphocytes Relative: 32 % (ref 12.0–46.0)
Lymphs Abs: 1.4 10*3/uL (ref 0.7–4.0)
MCHC: 32.9 g/dL (ref 30.0–36.0)
MCV: 93.5 fl (ref 78.0–100.0)
MONOS PCT: 6.4 % (ref 3.0–12.0)
Monocytes Absolute: 0.3 10*3/uL (ref 0.1–1.0)
Neutro Abs: 2.6 10*3/uL (ref 1.4–7.7)
Neutrophils Relative %: 58.6 % (ref 43.0–77.0)
Platelets: 164 10*3/uL (ref 150.0–400.0)
RBC: 4.26 Mil/uL (ref 3.87–5.11)
RDW: 14.2 % (ref 11.5–15.5)
WBC: 4.5 10*3/uL (ref 4.0–10.5)

## 2018-09-02 LAB — HEMOGLOBIN A1C: Hgb A1c MFr Bld: 6.3 % (ref 4.6–6.5)

## 2018-09-02 LAB — BASIC METABOLIC PANEL
BUN: 14 mg/dL (ref 6–23)
CO2: 30 mEq/L (ref 19–32)
Calcium: 9.7 mg/dL (ref 8.4–10.5)
Chloride: 103 mEq/L (ref 96–112)
Creatinine, Ser: 0.82 mg/dL (ref 0.40–1.20)
GFR: 75.45 mL/min (ref >60.00)
Glucose, Bld: 94 mg/dL (ref 70–99)
Potassium: 3.8 mEq/L (ref 3.5–5.1)
Sodium: 141 mEq/L (ref 135–145)

## 2018-09-03 ENCOUNTER — Encounter: Payer: Self-pay | Admitting: Internal Medicine

## 2018-09-05 ENCOUNTER — Encounter: Payer: Self-pay | Admitting: Internal Medicine

## 2018-09-05 NOTE — Progress Notes (Signed)
Pt here for labs.  Was placed on office visit schedule.  Should be lab only.  No charge for visit.

## 2018-09-08 ENCOUNTER — Telehealth: Payer: Self-pay | Admitting: Neurology

## 2018-09-08 NOTE — Telephone Encounter (Signed)
Called the patient to make her aware that Dr Brett Fairy will be out of the office at her apt time for a dentist apt. No answer, LVM informing the patient to call back. Patient could be seen with Selinda Eon or Janett Billow. This apt is for medical management and is just the yearly apt.

## 2018-09-13 NOTE — Telephone Encounter (Signed)
Called the patient a 2nd time to cancel her apt for 3:30 pm on 09/16/18. Pt verbalized understanding. Pt wasn't able to reschedule at this time and states she would call back.  Please reschedule her with any NP or whoever has an opening.

## 2018-09-15 ENCOUNTER — Other Ambulatory Visit: Payer: Self-pay | Admitting: Neurology

## 2018-09-15 ENCOUNTER — Other Ambulatory Visit: Payer: Self-pay | Admitting: Internal Medicine

## 2018-09-16 ENCOUNTER — Encounter

## 2018-09-16 ENCOUNTER — Ambulatory Visit: Payer: Self-pay | Admitting: Neurology

## 2018-10-11 ENCOUNTER — Encounter: Payer: Self-pay | Admitting: Internal Medicine

## 2018-10-12 ENCOUNTER — Telehealth: Payer: Self-pay | Admitting: Neurology

## 2018-10-12 NOTE — Telephone Encounter (Signed)
Pt is needing a refill on her modafinil (PROVIGIL) 100 MG tablet sent to the [facility]

## 2018-10-13 ENCOUNTER — Other Ambulatory Visit: Payer: Self-pay | Admitting: Neurology

## 2018-10-13 MED ORDER — MODAFINIL 100 MG PO TABS: ORAL_TABLET | ORAL | 0 refills | Status: DC

## 2018-10-13 NOTE — Telephone Encounter (Signed)
I have routed this request to Dr Dohmeier for review. The pt is due for the medication and [place] registry was verified.  

## 2018-10-19 ENCOUNTER — Encounter: Payer: Self-pay | Admitting: Internal Medicine

## 2018-10-26 LAB — HM MAMMOGRAPHY

## 2018-10-30 NOTE — Progress Notes (Signed)
[facility]  PATIENT: Kimberly Figueroa DOB: [birth year]   REASON FOR VISIT: Follow-up for chronic insomnia daytime drowsiness HISTORY FROM: Patient    HISTORY OF PRESENT ILLNESS:UPDATE 3 /9//2020CM Ms. Kimberly Figueroa, 63 year old female returns for follow-up with history of chronic insomnia and daytime drowsiness.  She has had more problems with her daytime drowsiness recently ESS score is 15.  She is currently on Ambien half a tab or 5 mg at night for insomnia.  She only takes Provigil as needed.  She is falling asleep twice lately in a meeting.  Her last sleep study did not reveal significant sleep apnea or snoring however she does have significant periodic limb movements of sleep. She returns for reevaluation   7/10/18CD Kimberly Figueroa is a 63 y.o. female with chronic insomnia,  03-03-2017, Kimberly Figueroa has experienced the benefit of a weighted blanket. She is using a blanket that weighs 15 pounds and feels that she has an easier time to fall asleep and stay asleep, she still uses Ambien but a half tablet nightly. If she needs she has Provigil available as a daytime medication helping her to stay awake and alert, but she rarely needs it. She not longer uses Trazodone.  We are meeting to refill.  REVIEW OF SYSTEMS: Full 14 system review of systems performed and notable only for those listed, all others are neg:  Constitutional: neg  Cardiovascular: neg Ear/Nose/Throat: neg  Skin: neg Eyes: neg Respiratory: neg Gastroitestinal: neg  Hematology/Lymphatic: neg  Endocrine: neg Musculoskeletal:neg Allergy/Immunology: neg Neurological: neg Psychiatric: neg Sleep : insomnia, daytime drowsiness   ALLERGIES: Allergies  Allergen Reactions  . Darvon [Propoxyphene] Nausea Only  . Vicodin [Hydrocodone-Acetaminophen] Itching    HOME MEDICATIONS: Outpatient Medications Prior to Visit  Medication Sig Dispense Refill  . citalopram (CELEXA) 20 MG tablet  TAKE 1 & 1/2 TABLETS (30MG ) BY MOUTH DAILY 45 tablet 1  . cyclobenzaprine (FLEXERIL) 5 MG tablet TAKE 1 TABLET BY MOUTH AT BEDTIME AS NEEDED FOR MUSCLE SPASM. CAN CAUSE DROWSINESS. 20 tablet 0  . fluticasone (FLONASE) 50 MCG/ACT nasal spray     . Misc Natural Products (PROGESTERONE EX) Apply 1 mL topically daily. For 6 days out of the week.    . modafinil (PROVIGIL) 100 MG tablet TAKE 1 TABLET BY MOUTH AS NEEDED 30 tablet 0  . Multiple Vitamins-Minerals (MULTIVITAMIN GUMMIES ADULTS PO) Take by mouth. VitaCraves    . Progesterone Micronized 10 % CREA Place onto the skin.    Marland Kitchen terbinafine (LAMISIL) 250 MG tablet Take 1 tablet (250 mg total) by mouth daily. 90 tablet 0  . valACYclovir (VALTREX) 1000 MG tablet     . zolpidem (AMBIEN) 10 MG tablet TAKE 1/2 TO 1 TABLET BY MOUTH ONCE DAILY AS NEEDED 30 tablet 0  . meloxicam (MOBIC) 15 MG tablet Take 1 tablet (15 mg total) by mouth daily. For pain as needed. (Patient not taking: Reported on 11/01/2018) 20 tablet 0  . metroNIDAZOLE (METROCREAM) 0.75 % cream 2 (two) times daily.    . valACYclovir (VALTREX) 500 MG tablet   10   No facility-administered medications prior to visit.     PAST MEDICAL HISTORY: Past Medical History:  Diagnosis Date  . Endometriosis   . History of frequent urinary tract infections    s/p bladder dilatation (11/05)  . Hypersomnia, recurrent 06/28/2014  . ITP (idiopathic thrombocytopenic purpura)   . Pneumonia     PAST SURGICAL HISTORY: Past Surgical History:  Procedure Laterality Date  . bladder  dilatation  11/05  . CESAREAN SECTION  1993  . COLONOSCOPY    . laproscopic surgery  10/06   found to have endometriosis  . SHOULDER SURGERY Left 2008  . TONSILLECTOMY  1961  . VAGINAL DELIVERY  1987  . VAGINAL HYSTERECTOMY  02/07    FAMILY HISTORY: Family History  Problem Relation Age of Onset  . Alcohol abuse Mother   . Varicose Veins Mother   . Diabetes Father   . Hearing loss Father   . Heart disease Father     . Crohn's disease Father   . Crohn's disease Paternal Grandfather   . Breast cancer Neg Hx   . Colon cancer Neg Hx     SOCIAL HISTORY: Social History   Socioeconomic History  . Marital status: Married    Spouse name: Gwyndolyn Saxon  . Number of children: 2  . Years of education: college  . Highest education level: Not on file  Occupational History  . Occupation: Advertising account planner. Director    Employer: [employer]  Social Needs  . Financial resource strain: Not on file  . Food insecurity:    Worry: Not on file    Inability: Not on file  . Transportation needs:    Medical: Not on file    Non-medical: Not on file  Tobacco Use  . 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 Smokeless tobacco: Never Used  Substance and Sexual Activity  . Alcohol use: No    Alcohol/week: 0.0 standard drinks  . Drug use: No  . Sexual activity: Not on file  Lifestyle  . Physical activity:    Days per week: Not on file    Minutes per session: Not on file  . Stress: Not on file  Relationships  . Social connections:    Talks on phone: Not on file    Gets together: Not on file    Attends religious service: Not on file    Active member of club or organization: Not on file    Attends meetings of clubs or organizations: Not on file    Relationship status: Not on file  . Intimate partner violence:    Fear of current or ex partner: Not on file    Emotionally abused: Not on file    Physically abused: Not on file    Forced sexual activity: Not on file  Other Topics Concern  . Not on file  Social History Narrative   Patient consumes 6-8 cups tea daily, is right handed     PHYSICAL EXAM  Vitals:   11/01/18 1339  BP: 100/60  Pulse: 65  Weight: 151 lb 6.4 oz (68.7 kg)  Height: 5\' 6"  (1.676 m)   Body mass index is 24.44 kg/m.  Generalized: Well developed, in no acute distress  Head: normocephalic and atraumatic,. Oropharynx benign  Neck: Supple, no carotid bruits  Cardiac: Regular rate rhythm,  Musculoskeletal:  No deformity  Skin no rash or edema Neurological examination   Mentation: Alert oriented to time, place, history taking. Attention span and concentration appropriate. Recent and remote memory intact.  Follows all commands speech and language fluent.   Cranial nerve II-XII: Pupils were equal round reactive to light extraocular movements were full, visual field were full on confrontational test. Facial sensation and strength were normal. hearing was intact to finger rubbing bilaterally. Uvula tongue midline. head turning and shoulder shrug were normal and symmetric.Tongue protrusion into cheek strength was normal. Motor: normal bulk and tone, full strength in the BUE, BLE, Sensory:  normal and symmetric to light touch,   Coordination: finger-nose-finger, heel-to-shin bilaterally, no dysmetria Gait and Station: Rising up from seated position without assistance, normal stance,  moderate stride, good arm swing, smooth turning, able to perform tiptoe, and heel walking without difficulty. Tandem gait is steady  DIAGNOSTIC DATA (LABS, IMAGING, TESTING) - I reviewed patient records, labs, notes, testing and imaging myself where available.  Lab Results  Component Value Date   WBC 4.5 09/02/2018   HGB 13.1 09/02/2018   HCT 39.8 09/02/2018   MCV 93.5 09/02/2018   PLT 164.0 09/02/2018      Component Value Date/Time   NA 141 09/02/2018 1503   K 3.8 09/02/2018 1503   CL 103 09/02/2018 1503   CO2 30 09/02/2018 1503   GLUCOSE 94 09/02/2018 1503   BUN 14 09/02/2018 1503   CREATININE 0.82 09/02/2018 1503   CALCIUM 9.7 09/02/2018 1503   PROT 6.7 08/02/2018 1705   ALBUMIN 4.3 04/29/2018 0848   AST 18 08/02/2018 1705   ALT 15 08/02/2018 1705   ALKPHOS 72 04/29/2018 0848   BILITOT 0.3 08/02/2018 1705   Lab Results  Component Value Date   CHOL 171 04/29/2018   HDL 67.00 04/29/2018   LDLCALC 94 04/29/2018   TRIG 52.0 04/29/2018   CHOLHDL 3 04/29/2018   Lab Results  Component Value Date    HGBA1C 6.3 09/02/2018   No results found for: VITAMINB12 Lab Results  Component Value Date   TSH 2.72 04/29/2018      ASSESSMENT AND PLAN  63 y.o. year old female  has a past medical history of Endometriosis, History of frequent urinary tract infections, Hypersomnia, recurrent (06/28/2014), ITP (idiopathic thrombocytopenic purpura), and Pneumonia.  And daytime somnolence here to follow-up.     Continue Ambien 10 mg 1/2 tablet daily as needed will refill Provigil 100 mg daily for daytime sleepiness will refill ESS score 15 F/U in 6 months I spent 15 minutes in total face to face time with the patient more than 50% of which was spent counseling and coordination of care, reviewing sleep test results reviewing medications and discussing and reviewing the diagnosis of daytime somnolence and insomnia and further treatment options. , Rayburn Ma, [facility], APRN  [facility] [address] [place], [place] [address] [phone]

## 2018-11-01 ENCOUNTER — Ambulatory Visit: Payer: No Typology Code available for payment source | Admitting: Nurse Practitioner

## 2018-11-01 ENCOUNTER — Encounter: Payer: Self-pay | Admitting: Nurse Practitioner

## 2018-11-01 VITALS — BP 100/60 | HR 65 | Ht 66.0 in | Wt 151.4 lb

## 2018-11-01 DIAGNOSIS — R400 Somnolence: Secondary | ICD-10-CM

## 2018-11-01 DIAGNOSIS — F5104 Psychophysiologic insomnia: Secondary | ICD-10-CM

## 2018-11-01 MED ORDER — MODAFINIL 100 MG PO TABS: 100.0000 mg | ORAL_TABLET | Freq: Every day | ORAL | 1 refills | Status: DC

## 2018-11-01 MED ORDER — ZOLPIDEM TARTRATE 10 MG PO TABS: ORAL_TABLET | ORAL | 1 refills | Status: DC

## 2018-11-01 NOTE — Progress Notes (Signed)
Fax confirmation received for zolpidem, and modafinil [facility] [phone].

## 2018-11-01 NOTE — Patient Instructions (Signed)
Continue Ambien 10 mg 1/2 tablet daily as needed will refill Provigil 100 mg daily for daytime sleepiness will refill ESS score 15 F/U in 6 months

## 2018-11-02 ENCOUNTER — Ambulatory Visit: Payer: No Typology Code available for payment source | Admitting: Podiatry

## 2018-11-02 ENCOUNTER — Encounter: Payer: Self-pay | Admitting: Podiatry

## 2018-11-02 DIAGNOSIS — B351 Tinea unguium: Secondary | ICD-10-CM

## 2018-11-02 NOTE — Patient Instructions (Signed)

## 2018-11-02 NOTE — Progress Notes (Signed)
Subjective: Kimberly Figueroa presents today for follow up onychomycosis with oral terbinafine therapy. She feels her right great toenail is not responding, and, may in fact be getting worse as it seems the fungus is growing in a more proximal direction.  She has about a week's worth left of terbinafine.   She continues to get pedicures but does take all of her own tools, including her own nail polish to those appointments.  Ms. Service would like to discuss other options for her onychomycosis.  Kimberly Pheasant, MD is her PCP.   Current Outpatient Medications:  .  citalopram (CELEXA) 20 MG tablet, TAKE 1 & 1/2 TABLETS (30MG ) BY MOUTH DAILY, Disp: 45 tablet, Rfl: 1 .  cyclobenzaprine (FLEXERIL) 5 MG tablet, TAKE 1 TABLET BY MOUTH AT BEDTIME AS NEEDED FOR MUSCLE SPASM. CAN CAUSE DROWSINESS., Disp: 20 tablet, Rfl: 0 .  fluticasone (FLONASE) 50 MCG/ACT nasal spray, , Disp: , Rfl:  .  Misc Natural Products (PROGESTERONE EX), Apply 1 mL topically daily. For 6 days out of the week., Disp: , Rfl:  .  modafinil (PROVIGIL) 100 MG tablet, Take 1 tablet (100 mg total) by mouth daily., Disp: 90 tablet, Rfl: 1 .  Multiple Vitamins-Minerals (MULTIVITAMIN GUMMIES ADULTS PO), Take by mouth. VitaCraves, Disp: , Rfl:  .  Progesterone Micronized 10 % CREA, Place onto the skin., Disp: , Rfl:  .  SOOLANTRA 1 % CREA, , Disp: , Rfl:  .  terbinafine (LAMISIL) 250 MG tablet, Take 1 tablet (250 mg total) by mouth daily., Disp: 90 tablet, Rfl: 0 .  valACYclovir (VALTREX) 1000 MG tablet, , Disp: , Rfl:  .  zolpidem (AMBIEN) 10 MG tablet, One half tab every night, Disp: 45 tablet, Rfl: 1  Allergies  Allergen Reactions  . Darvon [Propoxyphene] Nausea Only  . Vicodin [Hydrocodone-Acetaminophen] Itching    Objective:  Vascular Examination: Capillary refill time immediate x 10 digits.  Dorsalis pedis and Posterior tibial pulses palpable b/l.  Digital hair present x 10 digits.  Skin temperature gradient  WNL b/l.  Dermatological Examination: Skin with normal turgor, texture and tone b/l.  Toenails b/l great toes and left 2nd digit remain discolored, thick, dystrophic with subungual debris and pain with palpation to nailbeds due to thickness of nails. She does not have a significant amount of clearing of the nailplates proximally.   Musculoskeletal: Muscle strength 5/5 to all LE muscle groups  No gross bony deformities b/l.  No pain, crepitus or joint limitation noted with ROM.   Neurological: Sensation intact with 10 gram monofilament.  Vibratory sensation intact.  Assessment: Painful onychomycosis toenails 1-5 b/l   Plan: 1. Revisited treatment options for onychomycosis which are avulsion of nailplates with topical treatment of nailbed, laser therapy of affected toenails, topical treatment. Discussed success rates for each. She opted for laser therapy of the toenails. Explained the process for scheduling laser appointments and this would be a 35-month therapy (Monthly laser treatments for five consecutive months). She related understanding of everything discussed on today's visit. She will schedule an appointment with Marylou Mccoy at her convenience. 2. Patient to continue soft, supportive shoe gear daily. 3. Patient to report any pedal injuries to medical professional immediately. 4. I will see her during one of her laser treatments in 2-3 months. 5. Patient/POA to call should there be a concern in the interim.

## 2018-11-10 ENCOUNTER — Other Ambulatory Visit: Payer: Self-pay | Admitting: Internal Medicine

## 2018-11-11 ENCOUNTER — Encounter: Payer: Self-pay | Admitting: Internal Medicine

## 2018-11-12 ENCOUNTER — Other Ambulatory Visit: Payer: Self-pay

## 2018-11-12 MED ORDER — CITALOPRAM HYDROBROMIDE 40 MG PO TABS: 40.0000 mg | ORAL_TABLET | Freq: Every day | ORAL | 2 refills | Status: DC

## 2018-11-12 MED ORDER — EPINEPHRINE 0.3 MG/0.3ML IJ SOAJ: 0.3000 mg | Freq: Once | INTRAMUSCULAR | 1 refills | Status: AC

## 2018-11-12 NOTE — Telephone Encounter (Signed)
rx sent in for citalopram 40mg  #30 with 2 refills.

## 2018-11-15 ENCOUNTER — Other Ambulatory Visit: Payer: No Typology Code available for payment source

## 2018-11-23 ENCOUNTER — Other Ambulatory Visit: Payer: No Typology Code available for payment source

## 2018-12-12 ENCOUNTER — Encounter: Payer: Self-pay | Admitting: Neurology

## 2018-12-13 ENCOUNTER — Other Ambulatory Visit: Payer: Self-pay | Admitting: Neurology

## 2018-12-13 MED ORDER — ZOLPIDEM TARTRATE 10 MG PO TABS: ORAL_TABLET | ORAL | 1 refills | Status: DC

## 2018-12-13 MED FILL — CITALOPRAM HBR 40 MG TABLET: 40 | 30 days supply | Qty: 30 | Fill #0

## 2018-12-15 ENCOUNTER — Other Ambulatory Visit: Payer: No Typology Code available for payment source

## 2018-12-24 ENCOUNTER — Encounter: Payer: Self-pay | Admitting: Neurology

## 2018-12-27 ENCOUNTER — Other Ambulatory Visit: Payer: Self-pay | Admitting: Neurology

## 2018-12-27 MED ORDER — ZOLPIDEM TARTRATE 10 MG PO TABS: ORAL_TABLET | ORAL | 1 refills | Status: DC

## 2018-12-30 MED FILL — SOOLANTRA 1% CREAM: 1 | 30 days supply | Qty: 45 | Fill #0

## 2019-01-06 MED FILL — CITALOPRAM HBR 40 MG TABLET: 40 | 30 days supply | Qty: 30 | Fill #1

## 2019-01-07 ENCOUNTER — Other Ambulatory Visit: Payer: Self-pay

## 2019-01-11 ENCOUNTER — Encounter: Payer: Self-pay | Admitting: Internal Medicine

## 2019-01-18 ENCOUNTER — Other Ambulatory Visit: Payer: Self-pay

## 2019-01-19 ENCOUNTER — Other Ambulatory Visit: Payer: Self-pay

## 2019-01-19 ENCOUNTER — Encounter: Payer: Self-pay | Admitting: Internal Medicine

## 2019-01-19 ENCOUNTER — Ambulatory Visit (INDEPENDENT_AMBULATORY_CARE_PROVIDER_SITE_OTHER): Payer: No Typology Code available for payment source | Admitting: Internal Medicine

## 2019-01-19 VITALS — BP 128/72 | HR 68 | Temp 98.7°F | Resp 16 | Ht 65.0 in | Wt 151.6 lb

## 2019-01-19 DIAGNOSIS — F5104 Psychophysiologic insomnia: Secondary | ICD-10-CM | POA: Diagnosis not present

## 2019-01-19 DIAGNOSIS — R739 Hyperglycemia, unspecified: Secondary | ICD-10-CM | POA: Diagnosis not present

## 2019-01-19 DIAGNOSIS — F439 Reaction to severe stress, unspecified: Secondary | ICD-10-CM

## 2019-01-19 DIAGNOSIS — Z23 Encounter for immunization: Secondary | ICD-10-CM | POA: Diagnosis not present

## 2019-01-19 DIAGNOSIS — D696 Thrombocytopenia, unspecified: Secondary | ICD-10-CM | POA: Diagnosis not present

## 2019-01-19 DIAGNOSIS — Z0000 Encounter for general adult medical examination without abnormal findings: Secondary | ICD-10-CM | POA: Diagnosis not present

## 2019-01-19 DIAGNOSIS — D638 Anemia in other chronic diseases classified elsewhere: Secondary | ICD-10-CM

## 2019-01-19 LAB — BASIC METABOLIC PANEL
BUN: 13 mg/dL (ref 6–23)
CO2: 33 mEq/L — ABNORMAL HIGH (ref 19–32)
Calcium: 9.2 mg/dL (ref 8.4–10.5)
Chloride: 102 mEq/L (ref 96–112)
Creatinine, Ser: 0.65 mg/dL (ref 0.40–1.20)
GFR: 92.7 mL/min (ref >60.00)
Glucose, Bld: 85 mg/dL (ref 70–99)
Potassium: 4.1 mEq/L (ref 3.5–5.1)
Sodium: 141 mEq/L (ref 135–145)

## 2019-01-19 LAB — CBC WITH DIFFERENTIAL/PLATELET
Basophils Absolute: 0 10*3/uL (ref 0.0–0.1)
Basophils Relative: 0.8 % (ref 0.0–3.0)
Eosinophils Absolute: 0.1 10*3/uL (ref 0.0–0.7)
Eosinophils Relative: 2.2 % (ref 0.0–5.0)
HCT: 39.1 % (ref 36.0–46.0)
Hemoglobin: 13.2 g/dL (ref 12.0–15.0)
Lymphocytes Relative: 27.9 % (ref 12.0–46.0)
Lymphs Abs: 1.2 10*3/uL (ref 0.7–4.0)
MCHC: 33.8 g/dL (ref 30.0–36.0)
MCV: 92.9 fl (ref 78.0–100.0)
Monocytes Absolute: 0.3 10*3/uL (ref 0.1–1.0)
Monocytes Relative: 7.4 % (ref 3.0–12.0)
Neutro Abs: 2.7 10*3/uL (ref 1.4–7.7)
Neutrophils Relative %: 61.7 % (ref 43.0–77.0)
Platelets: 148 10*3/uL — ABNORMAL LOW (ref 150.0–400.0)
RBC: 4.22 Mil/uL (ref 3.87–5.11)
RDW: 14.6 % (ref 11.5–15.5)
WBC: 4.4 10*3/uL (ref 4.0–10.5)

## 2019-01-19 LAB — LIPID PANEL
Cholesterol: 186 mg/dL (ref 0–200)
HDL: 73.3 mg/dL (ref >39.00)
LDL Cholesterol: 101 mg/dL — ABNORMAL HIGH (ref 0–99)
NonHDL: 112.37
Total CHOL/HDL Ratio: 3
Triglycerides: 56 mg/dL (ref 0.0–149.0)
VLDL: 11.2 mg/dL (ref 0.0–40.0)

## 2019-01-19 LAB — HEMOGLOBIN A1C: Hgb A1c MFr Bld: 6.1 % (ref 4.6–6.5)

## 2019-01-19 LAB — HEPATIC FUNCTION PANEL
ALT: 15 U/L (ref 0–35)
AST: 18 U/L (ref 0–37)
Albumin: 4.2 g/dL (ref 3.5–5.2)
Alkaline Phosphatase: 83 U/L (ref 39–117)
Bilirubin, Direct: 0.1 mg/dL (ref 0.0–0.3)
Total Bilirubin: 0.5 mg/dL (ref 0.2–1.2)
Total Protein: 6.6 g/dL (ref 6.0–8.3)

## 2019-01-19 LAB — TSH: TSH: 1.67 u[IU]/mL (ref 0.35–4.50)

## 2019-01-19 NOTE — Assessment & Plan Note (Signed)
Physical today 01/19/19.  S/p hysterectomy.  Mammogram 10/26/18 - Briads II.  Colonoscopy 01/2014 - normal.

## 2019-01-19 NOTE — Assessment & Plan Note (Signed)
Has ITP. Worked up by hematology.  Recheck cbc.

## 2019-01-19 NOTE — Progress Notes (Signed)
Patient ID: Kimberly Figueroa, female   DOB: 06-23-1956, 63 y.o.   MRN: [id number]   Subjective:    Patient ID: Kimberly Figueroa, female    DOB: December 13, 1955, 63 y.o.   MRN: [id number]  HPI  Patient here for physical exam.  Increased stress. Working from home.  Discussed with her today.  Overall she feels she is handling things relatively well.  On citalopram.  Seeing neurology for her sleep issues.  Taking ambien.  States on some occasions she has been taking one whole pill at night.  Has trouble sleeping.  Continues to f/u with neurology.  Not exercising regularly.  Trying to watch what she eats.  No chest pain.  No sob.  No acid reflux.  No abdominal pain.  Bowels moving.  She is s/p hysterectomy.     Past Medical History:  Diagnosis Date   Endometriosis    History of frequent urinary tract infections    s/p bladder dilatation (11/05)   Hypersomnia, recurrent 06/28/2014   ITP (idiopathic thrombocytopenic purpura)    Pneumonia    Past Surgical History:  Procedure Laterality Date   bladder dilatation  11/05   CESAREAN SECTION  1993   COLONOSCOPY     laproscopic surgery  10/06   found to have endometriosis   SHOULDER SURGERY Left 2008   [place]  02/07   Family History  Problem Relation Age of Onset   Alcohol abuse Mother    Varicose Veins Mother    Diabetes Father    Hearing loss Father    Heart disease Father    Crohn's disease Father    Crohn's disease Paternal Grandfather    Breast cancer Neg Hx    Colon cancer Neg Hx    Social History   Socioeconomic History   Marital status: Married    Spouse name: Gwyndolyn Saxon   Number of children: 2   Years of education: college   Highest education level: Not on file  Occupational History   Occupation: Advertising account planner. Marketing executive: [employer]  Social Designer, fashion/clothing strain: Not on file   Food insecurity:    Worry: Not on  file    Inability: Not on file   Transportation needs:    Medical: Not on file    Non-medical: Not on file  Tobacco Use   Smoking status: Never Smoker   Smokeless tobacco: Never Used  Substance and Sexual Activity   Alcohol use: No    Alcohol/week: 0.0 standard drinks   Drug use: No   Sexual activity: Not on file  Lifestyle   Physical activity:    Days per week: Not on file    Minutes per session: Not on file   Stress: Not on file  Relationships   Social connections:    Talks on phone: Not on file    Gets together: Not on file    Attends religious service: Not on file    Active member of club or organization: Not on file    Attends meetings of clubs or organizations: Not on file    Relationship status: Not on file  Other Topics Concern   Not on file  Social History Narrative   Patient consumes 6-8 cups tea daily, is right handed    Outpatient Encounter Medications as of 01/19/2019  Medication Sig   citalopram (CELEXA) 40 MG tablet Take 1 tablet (40 mg total) by  mouth daily.   cyclobenzaprine (FLEXERIL) 5 MG tablet TAKE 1 TABLET BY MOUTH AT BEDTIME AS NEEDED FOR MUSCLE SPASM. CAN CAUSE DROWSINESS.   fluticasone (FLONASE) 50 MCG/ACT nasal spray    Misc Natural Products (PROGESTERONE EX) Apply 1 mL topically daily. For 6 days out of the week.   modafinil (PROVIGIL) 100 MG tablet Take 1 tablet (100 mg total) by mouth daily.   Multiple Vitamins-Minerals (MULTIVITAMIN GUMMIES ADULTS PO) Take by mouth. VitaCraves   Progesterone Micronized 10 % CREA Place onto the skin.   SOOLANTRA 1 % CREA    valACYclovir (VALTREX) 1000 MG tablet    zolpidem (AMBIEN) 10 MG tablet One half tab every night   [DISCONTINUED] terbinafine (LAMISIL) 250 MG tablet Take 1 tablet (250 mg total) by mouth daily.   No facility-administered encounter medications on file as of 01/19/2019.     Review of Systems  Constitutional: Negative for appetite change and unexpected weight change.    HENT: Negative for congestion and sinus pressure.   Eyes: Negative for pain and visual disturbance.  Respiratory: Negative for cough, chest tightness and shortness of breath.   Cardiovascular: Negative for chest pain, palpitations and leg swelling.  Gastrointestinal: Negative for abdominal pain, diarrhea, nausea and vomiting.  Genitourinary: Negative for difficulty urinating and dysuria.  Musculoskeletal: Negative for joint swelling and myalgias.  Skin: Negative for color change and rash.  Neurological: Negative for dizziness, light-headedness and headaches.  Hematological: Negative for adenopathy. Does not bruise/bleed easily.  Psychiatric/Behavioral: Negative for agitation and dysphoric mood.       Objective:    Physical Exam Constitutional:      General: She is not in acute distress.    Appearance: Normal appearance. She is well-developed.  HENT:     Right Ear: External ear normal. There is no impacted cerumen.     Left Ear: External ear normal. There is no impacted cerumen.  Eyes:     General: No scleral icterus.       Right eye: No discharge.        Left eye: No discharge.     Conjunctiva/sclera: Conjunctivae normal.  Neck:     Musculoskeletal: Neck supple. No muscular tenderness.     Thyroid: No thyromegaly.  Cardiovascular:     Rate and Rhythm: Normal rate and regular rhythm.  Pulmonary:     Effort: No tachypnea, accessory muscle usage or respiratory distress.     Breath sounds: Normal breath sounds. No decreased breath sounds or wheezing.  Chest:     Breasts:        Right: No inverted nipple, mass, nipple discharge or tenderness (no axillary adenopathy).        Left: No inverted nipple, mass, nipple discharge or tenderness (no axilarry adenopathy).  Abdominal:     General: Bowel sounds are normal.     Palpations: Abdomen is soft.     Tenderness: There is no abdominal tenderness.  Musculoskeletal:        General: No swelling or tenderness.  Lymphadenopathy:      Cervical: No cervical adenopathy.  Skin:    Findings: No erythema or rash.  Neurological:     Mental Status: She is alert and oriented to person, place, and time.  Psychiatric:        Mood and Affect: Mood normal.        Behavior: Behavior normal.     BP 128/72    Pulse 68    Temp 98.7 F (37.1 C) (Oral)  Resp 16    Ht '5\' 5"'  (1.651 m)    Wt 151 lb 9.6 oz (68.8 kg)    SpO2 97%    BMI 25.23 kg/m  Wt Readings from Last 3 Encounters:  01/19/19 151 lb 9.6 oz (68.8 kg)  11/01/18 151 lb 6.4 oz (68.7 kg)  04/08/18 152 lb (68.9 kg)     Lab Results  Component Value Date   WBC 4.4 01/19/2019   HGB 13.2 01/19/2019   HCT 39.1 01/19/2019   PLT 148.0 (L) 01/19/2019   GLUCOSE 85 01/19/2019   CHOL 186 01/19/2019   TRIG 56.0 01/19/2019   HDL 73.30 01/19/2019   LDLCALC 101 (H) 01/19/2019   ALT 15 01/19/2019   AST 18 01/19/2019   NA 141 01/19/2019   K 4.1 01/19/2019   CL 102 01/19/2019   CREATININE 0.65 01/19/2019   BUN 13 01/19/2019   CO2 33 (H) 01/19/2019   TSH 1.67 01/19/2019   HGBA1C 6.1 01/19/2019    Mr Lumbar Spine Wo Contrast  Result Date: 04/17/2017 CLINICAL DATA:  Pt states history of low-level back pain. LBP/sacral pain radiating into both hips and upper thighs x 1 week. Happened before in February 2018 and lasted for about a week. NKI. EXAM: MRI LUMBAR SPINE WITHOUT CONTRAST TECHNIQUE: Multiplanar, multisequence MR imaging of the lumbar spine was performed. No intravenous contrast was administered. COMPARISON:  None. FINDINGS: Segmentation:  Standard. Alignment:  Physiologic. Vertebrae:  No fracture, evidence of discitis, or bone lesion. Conus medullaris: Extends to the T12 level and appears normal. Paraspinal and other soft tissues: No paraspinal abnormality. Disc levels: Disc spaces: Disc desiccation with minimal disc height loss at L5-S1. T12-L1: No significant disc bulge. No evidence of neural foraminal stenosis. No central canal stenosis. L1-L2: No significant disc bulge.  No evidence of neural foraminal stenosis. No central canal stenosis. L2-L3: No significant disc bulge. No evidence of neural foraminal stenosis. No central canal stenosis. L3-L4: No significant disc bulge. No evidence of neural foraminal stenosis. No central canal stenosis. L4-L5: Minimal broad-based disc bulge. No evidence of neural foraminal stenosis. No central canal stenosis. L5-S1: Small central disc protrusion. No evidence of neural foraminal stenosis. No central canal stenosis. IMPRESSION: 1. Small central disc protrusion at L5-S1. 2. Mild broad-based disc bulge at L4-5. Electronically Signed   By: Kathreen Devoid   On: 04/17/2017 12:35       Assessment & Plan:   Problem List Items Addressed This Visit    Anemia    Follow cbc.       Chronic insomnia    Has been followed by neurology.  Taking ambien.        Relevant Orders   TSH (Completed)   Health care maintenance    Physical today 01/19/19.  S/p hysterectomy.  Mammogram 10/26/18 - Briads II.  Colonoscopy 01/2014 - normal.        Hyperglycemia    Low carb diet and exercise.  Follow met b and a1c.      Relevant Orders   Hemoglobin A1c (Completed)   Lipid panel (Completed)   Stress    Increased stress.  Discussed with her today.  She feels she is handling things relatively well.  Does not feel she  Needs anything more at this time.  Follow.        Thrombocytopenia (HCC)    Has ITP. Worked up by hematology.  Recheck cbc.        Relevant Orders   Hepatic function panel (Completed)  CBC with Differential/Platelet (Completed)   Basic metabolic panel (Completed)    Other Visit Diagnoses    Routine general medical examination at a health care facility    -  Primary   Need for Tdap vaccination       Relevant Orders   Tdap vaccine greater than or equal to 7yo IM (Completed)       Einar Pheasant, MD

## 2019-01-19 NOTE — Assessment & Plan Note (Signed)
Has been followed by neurology.  Taking ambien.

## 2019-01-19 NOTE — Assessment & Plan Note (Signed)
Increased stress.  Discussed with her today.  She feels she is handling things relatively well.  Does not feel she  Needs anything more at this time.  Follow.

## 2019-01-19 NOTE — Assessment & Plan Note (Signed)
Follow cbc.  

## 2019-01-19 NOTE — Assessment & Plan Note (Signed)
Low carb diet and exercise.  Follow met b and a1c.

## 2019-01-20 ENCOUNTER — Ambulatory Visit: Payer: No Typology Code available for payment source

## 2019-01-26 ENCOUNTER — Encounter: Payer: Self-pay | Admitting: Internal Medicine

## 2019-01-26 ENCOUNTER — Other Ambulatory Visit: Payer: Self-pay | Admitting: Family Medicine

## 2019-01-28 NOTE — Telephone Encounter (Signed)
Ok to refill.  You may need them to fax over a request.

## 2019-01-28 NOTE — Telephone Encounter (Signed)
Called [facility]. Had refill. Will be ready Monday. Pt is aware

## 2019-02-05 ENCOUNTER — Other Ambulatory Visit: Payer: Self-pay | Admitting: Internal Medicine

## 2019-02-07 ENCOUNTER — Other Ambulatory Visit: Payer: Self-pay | Admitting: Internal Medicine

## 2019-02-08 MED FILL — CITALOPRAM HBR 40 MG TABLET: 40 | 90 days supply | Qty: 90 | Fill #0

## 2019-03-16 ENCOUNTER — Telehealth: Payer: Self-pay | Admitting: Internal Medicine

## 2019-03-16 NOTE — Telephone Encounter (Signed)
Need pharmacy to fax over rx to sign.  Thanks.

## 2019-03-16 NOTE — Telephone Encounter (Signed)
Last OV:  01/19/19  Ordering Date:  10/27/14

## 2019-03-16 NOTE — Telephone Encounter (Signed)
Progesterone Micronized 10 % CREA   Sent to [facility]

## 2019-03-18 NOTE — Telephone Encounter (Signed)
Requested hard script from [facility]

## 2019-03-21 NOTE — Telephone Encounter (Signed)
Faxed to [facility]

## 2019-05-04 ENCOUNTER — Other Ambulatory Visit: Payer: Self-pay | Admitting: Internal Medicine

## 2019-05-04 NOTE — Telephone Encounter (Signed)
Medication Refill - Medication: Progesterone Micronized 10 % CREA    Preferred Pharmacy (with phone number or street name):  [facility], [place] - [place] [phone] (Phone) [phone] (Fax)

## 2019-05-04 NOTE — Telephone Encounter (Signed)
Requested medication (s) are due for refill today: yes  Requested medication (s) are on the active medication list: yes  Last refill: 10/2014  Future visit scheduled: yes  Notes to clinic:  Review for refill Last filled by historical provider    Requested Prescriptions  Pending Prescriptions Disp Refills   Progesterone Micronized 10 % CREA       Sig: Place onto the skin.     Off-Protocol Failed - 05/04/2019 11:22 AM      Failed - Medication not assigned to a protocol, review manually.      Passed - Valid encounter within last 12 months    Recent Outpatient Visits          3 months ago Routine general medical examination at a health care facility   [place], MD   8 months ago Hyperglycemia   [place], MD   1 year ago Anemia in other chronic diseases classified elsewhere   [facility], MD   1 year ago Breast cancer screening by mammogram   [facility], [place] Patient, MD   2 years ago Screening cholesterol level   [facility] Einar Pheasant, MD      Future Appointments            In 2 weeks Einar Pheasant, MD [facility], [facility]

## 2019-05-08 NOTE — Telephone Encounter (Signed)
I think we need a hard copy for this prescription refill.  Just let me know if something different.

## 2019-05-16 ENCOUNTER — Encounter: Payer: Self-pay | Admitting: Internal Medicine

## 2019-05-17 ENCOUNTER — Encounter: Payer: Self-pay | Admitting: Internal Medicine

## 2019-05-17 ENCOUNTER — Telehealth: Payer: Self-pay

## 2019-05-17 DIAGNOSIS — G4713 Recurrent hypersomnia: Secondary | ICD-10-CM

## 2019-05-17 DIAGNOSIS — L709 Acne, unspecified: Secondary | ICD-10-CM

## 2019-05-17 DIAGNOSIS — G4700 Insomnia, unspecified: Secondary | ICD-10-CM

## 2019-05-17 NOTE — Telephone Encounter (Signed)
Order placed for dermatology referral.  

## 2019-05-17 NOTE — Telephone Encounter (Signed)
Called the patient to make her aware that Dr Brett Fairy is booked out. She prefers to complete in office visits. Pt is a stable follow up and we can certainly offer video visit with NP if she prefers that

## 2019-05-17 NOTE — Telephone Encounter (Signed)
Patient called and stated that she would be out of town due to work the day of her appt. She would like to be seen in office October 14 or 15. Please advise

## 2019-05-23 ENCOUNTER — Ambulatory Visit (INDEPENDENT_AMBULATORY_CARE_PROVIDER_SITE_OTHER): Payer: No Typology Code available for payment source | Admitting: Internal Medicine

## 2019-05-23 ENCOUNTER — Other Ambulatory Visit: Payer: Self-pay

## 2019-05-23 DIAGNOSIS — M545 Low back pain, unspecified: Secondary | ICD-10-CM

## 2019-05-23 DIAGNOSIS — F5104 Psychophysiologic insomnia: Secondary | ICD-10-CM | POA: Diagnosis not present

## 2019-05-23 DIAGNOSIS — D638 Anemia in other chronic diseases classified elsewhere: Secondary | ICD-10-CM

## 2019-05-23 DIAGNOSIS — R400 Somnolence: Secondary | ICD-10-CM | POA: Diagnosis not present

## 2019-05-23 DIAGNOSIS — R739 Hyperglycemia, unspecified: Secondary | ICD-10-CM

## 2019-05-23 DIAGNOSIS — D696 Thrombocytopenia, unspecified: Secondary | ICD-10-CM

## 2019-05-23 DIAGNOSIS — F439 Reaction to severe stress, unspecified: Secondary | ICD-10-CM

## 2019-05-23 NOTE — Progress Notes (Signed)
Patient ID: Kimberly Figueroa, female   DOB: [birth year], 63 y.o.   MRN: [id number]   Virtual Visit via video Note  This visit type was conducted due to national recommendations for restrictions regarding the COVID-19 pandemic (e.g. social distancing).  This format is felt to be most appropriate for this patient at this time.  All issues noted in this document were discussed and addressed.  No physical exam was performed (except for noted visual exam findings with Video Visits).   I connected with Nelida Gores by telephone and verified that I am speaking with the correct person using two identifiers. Location patient: home Location provider: work  Persons participating in the videp visit: patient, provider  I discussed the limitations, risks, security and privacy concerns of performing an evaluation and management service by video and the availability of in person appointments. The patient expressed understanding and agreed to proceed.   Reason for visit: scheduled follow up.   HPI: Sees Dr Brett Fairy for her sleep issues.  Still seeing her and needs to continue to follow up with her.  Appears to be stable.  Tries to stay active.  No chest pain.  No sob.  No acid reflux.  No abdominal pain.  Bowels moving.  Handling stress.  Up to date with mammograms.  Needs labs.     ROS: See pertinent positives and negatives per HPI.  Past Medical History:  Diagnosis Date  . Endometriosis   . History of frequent urinary tract infections    s/p bladder dilatation (11/05)  . Hypersomnia, recurrent 06/28/2014  . ITP (idiopathic thrombocytopenic purpura)   . Pneumonia     Past Surgical History:  Procedure Laterality Date  . bladder dilatation  11/05  . CESAREAN SECTION  1993  . COLONOSCOPY    . laproscopic surgery  10/06   found to have endometriosis  . SHOULDER SURGERY Left 2008  . TONSILLECTOMY  1961  . VAGINAL DELIVERY  1987  . VAGINAL HYSTERECTOMY  02/07    Family History  Problem  Relation Age of Onset  . Alcohol abuse Mother   . Varicose Veins Mother   . Diabetes Father   . Hearing loss Father   . Heart disease Father   . Crohn's disease Father   . Crohn's disease Paternal Grandfather   . Breast cancer Neg Hx   . Colon cancer Neg Hx     SOCIAL HX: reviewed.    Current Outpatient Medications:  .  citalopram (CELEXA) 40 MG tablet, TAKE 1 TABLET (40 MG TOTAL) BY MOUTH DAILY., Disp: 90 tablet, Rfl: 1 .  cyclobenzaprine (FLEXERIL) 5 MG tablet, TAKE 1 TABLET BY MOUTH AT BEDTIME AS NEEDED FOR MUSCLE SPASM. CAN CAUSE DROWSINESS., Disp: 20 tablet, Rfl: 0 .  fluticasone (FLONASE) 50 MCG/ACT nasal spray, , Disp: , Rfl:  .  Misc Natural Products (PROGESTERONE EX), Apply 1 mL topically daily. For 6 days out of the week., Disp: , Rfl:  .  modafinil (PROVIGIL) 100 MG tablet, Take 1 tablet (100 mg total) by mouth daily., Disp: 90 tablet, Rfl: 1 .  Multiple Vitamins-Minerals (MULTIVITAMIN GUMMIES ADULTS PO), Take by mouth. VitaCraves, Disp: , Rfl:  .  Progesterone Micronized 10 % CREA, Place onto the skin., Disp: , Rfl:  .  SOOLANTRA 1 % CREA, , Disp: , Rfl:  .  valACYclovir (VALTREX) 1000 MG tablet, , Disp: , Rfl:  .  zolpidem (AMBIEN) 10 MG tablet, One half tab every night, Disp: 45 tablet, Rfl: 1  EXAM:  GENERAL: alert, oriented, appears well and in no acute distress  HEENT: atraumatic, conjunttiva clear, no obvious abnormalities on inspection of external nose and ears  NECK: normal movements of the head and neck  LUNGS: on inspection no signs of respiratory distress, breathing rate appears normal, no obvious gross SOB, gasping or wheezing  CV: no obvious cyanosis  PSYCH/NEURO: pleasant and cooperative, no obvious depression or anxiety, speech and thought processing grossly intact  ASSESSMENT AND PLAN:  Discussed the following assessment and plan:  Anemia Follow cbc. Normal colonoscopy 01/2014.   Back pain Has had persistent pain.  Had MRI.  Saw  neurosurgery.  Has been followed by pain clinic.    Chronic insomnia Followed by neurology.  Continues to follow.    Daytime somnolence Followed by neurology.   Hyperglycemia Low carb diet and exercise.  Follow met b and a1c.   Stress Discussed with her today.  She feels she is handling things well.  Continue celexa.  Follow.    Thrombocytopenia Has ITP.  Has been worked up by hematology.  Follow cbc.     I discussed the assessment and treatment plan with the patient. The patient was provided an opportunity to ask questions and all were answered. The patient agreed with the plan and demonstrated an understanding of the instructions.   The patient was advised to call back or seek an in-person evaluation if the symptoms worsen or if the condition fails to improve as anticipated.   Einar Pheasant, MD

## 2019-05-24 ENCOUNTER — Ambulatory Visit: Payer: No Typology Code available for payment source | Admitting: Internal Medicine

## 2019-05-24 ENCOUNTER — Ambulatory Visit: Payer: No Typology Code available for payment source | Admitting: Neurology

## 2019-05-28 ENCOUNTER — Encounter: Payer: Self-pay | Admitting: Internal Medicine

## 2019-05-28 NOTE — Assessment & Plan Note (Signed)
Follow cbc. Normal colonoscopy 01/2014.

## 2019-05-29 NOTE — Assessment & Plan Note (Signed)
Followed by neurology.  Continues to follow.

## 2019-05-29 NOTE — Assessment & Plan Note (Signed)
Discussed with her today.  She feels she is handling things well.  Continue celexa.  Follow.

## 2019-05-29 NOTE — Assessment & Plan Note (Signed)
Low carb diet and exercise.  Follow met b and a1c.  

## 2019-05-29 NOTE — Assessment & Plan Note (Signed)
Followed by neurology.   

## 2019-05-29 NOTE — Assessment & Plan Note (Signed)
Has ITP.  Has been worked up by hematology.  Follow cbc.

## 2019-05-29 NOTE — Assessment & Plan Note (Signed)
Has had persistent pain.  Had MRI.  Saw neurosurgery.  Has been followed by pain clinic.

## 2019-06-02 ENCOUNTER — Other Ambulatory Visit: Payer: No Typology Code available for payment source

## 2019-06-02 ENCOUNTER — Ambulatory Visit: Payer: No Typology Code available for payment source

## 2019-06-07 ENCOUNTER — Telehealth (INDEPENDENT_AMBULATORY_CARE_PROVIDER_SITE_OTHER): Payer: No Typology Code available for payment source | Admitting: Adult Health

## 2019-06-07 DIAGNOSIS — F5104 Psychophysiologic insomnia: Secondary | ICD-10-CM

## 2019-06-07 DIAGNOSIS — R400 Somnolence: Secondary | ICD-10-CM

## 2019-06-07 MED ORDER — ZOLPIDEM TARTRATE 10 MG PO TABS: 5.0000 mg | ORAL_TABLET | Freq: Every evening | ORAL | 0 refills | Status: DC | PRN

## 2019-06-07 NOTE — Progress Notes (Signed)
  [facility] [address] [place]. [place] [address] [phone]     Virtual Visit via Telephone Note  I connected with Kimberly Figueroa on 06/07/19 at  3:00 PM EDT by telephone located remotely at [facility] and verified that I am speaking with the correct person using two identifiers who reports being located at home   Visit scheduled by NP. I  discussed the limitations, risks, security and privacy concerns of performing an evaluation and management service by telephone and the availability of in person appointments. I also discussed with the patient that there may be a patient responsible charge related to this service. The patient expressed understanding and agreed to proceed. See telephone note for consent and additional scheduling information.    History of Present Illness:  Kimberly Figueroa is a 63 y.o. female who has been followed in this office for chronic insomnia and daytime sleepiness.  She was initially scheduled for face-to-face office follow up visit today time but due to [place], visit rescheduled for a video visit however her camera would not work right so we converted to a non-face-to-face telephone visit with patients consent.   Patient reports that she has been doing fairly well.  She states that she takes Ambien a half a tablet at bedtime and typically she can get around 3 to 4 hours of sleep before she begins to wake up.  She states that when she wakes up she sleeps off and on for the rest of the night.  She states in the past her prescription of Ambien was written to take a half a tablet to 1 tablet at bedtime.  She states that this typically works well as she would typically take a half a tablet and then maybe take an additional half a tablet if she woke up.  She reports that she does not have to use Provigil as often since she is working from home.  She denies any new issues.  She returns today for an evaluation.      Observations/Objective:  Generalized: Well developed, in no acute distress   Neurological examination  Mentation: Alert oriented to time, place, history taking. Follows all commands speech and language fluent  Assessment and Plan:  1.  Chronic insomnia 2.  Daytime sleepiness  I will adjust Ambien to take half a tablet to 1 full tablet at bedtime.  I have reviewed potential side effects with the patient.  She is currently not  using Provigil as often.  Overall she is doing fairly well.  She is advised that if her symptoms worsen or she develops new symptoms she should let us know.  She will follow-up in 6 months or sooner if needed.  Follow Up Instructions:   Follow-up in 6 months or sooner if needed    I discussed the assessment and treatment plan with the patient.  The patient was provided an opportunity to ask questions and all were answered to their satisfaction. The patient agreed with the plan and verbalized an understanding of the instructions.   I provided 12 minutes of non-face-to-face time during this encounter.    Ward Givens NP-C  [facility] [address] [place], [place] [address]  Phone [phone] Fax [phone]

## 2019-06-09 ENCOUNTER — Other Ambulatory Visit (INDEPENDENT_AMBULATORY_CARE_PROVIDER_SITE_OTHER): Payer: No Typology Code available for payment source

## 2019-06-09 ENCOUNTER — Ambulatory Visit (INDEPENDENT_AMBULATORY_CARE_PROVIDER_SITE_OTHER): Payer: No Typology Code available for payment source

## 2019-06-09 ENCOUNTER — Other Ambulatory Visit: Payer: Self-pay

## 2019-06-09 ENCOUNTER — Telehealth: Payer: Self-pay

## 2019-06-09 DIAGNOSIS — D638 Anemia in other chronic diseases classified elsewhere: Secondary | ICD-10-CM

## 2019-06-09 DIAGNOSIS — Z23 Encounter for immunization: Secondary | ICD-10-CM | POA: Diagnosis not present

## 2019-06-09 DIAGNOSIS — R739 Hyperglycemia, unspecified: Secondary | ICD-10-CM

## 2019-06-09 LAB — CBC WITH DIFFERENTIAL/PLATELET
Basophils Absolute: 0.1 10*3/uL (ref 0.0–0.1)
Basophils Relative: 1.4 % (ref 0.0–3.0)
Eosinophils Absolute: 0.1 10*3/uL (ref 0.0–0.7)
Eosinophils Relative: 3.3 % (ref 0.0–5.0)
HCT: 38.6 % (ref 36.0–46.0)
Hemoglobin: 12.5 g/dL (ref 12.0–15.0)
Lymphocytes Relative: 30.9 % (ref 12.0–46.0)
Lymphs Abs: 1.3 10*3/uL (ref 0.7–4.0)
MCHC: 32.4 g/dL (ref 30.0–36.0)
MCV: 90.6 fl (ref 78.0–100.0)
Monocytes Absolute: 0.3 10*3/uL (ref 0.1–1.0)
Monocytes Relative: 6.8 % (ref 3.0–12.0)
Neutro Abs: 2.5 10*3/uL (ref 1.4–7.7)
Neutrophils Relative %: 57.6 % (ref 43.0–77.0)
Platelets: 151 10*3/uL (ref 150.0–400.0)
RBC: 4.25 Mil/uL (ref 3.87–5.11)
RDW: 14 % (ref 11.5–15.5)
WBC: 4.3 10*3/uL (ref 4.0–10.5)

## 2019-06-09 LAB — BASIC METABOLIC PANEL
BUN: 11 mg/dL (ref 6–23)
CO2: 31 mEq/L (ref 19–32)
Calcium: 9.1 mg/dL (ref 8.4–10.5)
Chloride: 104 mEq/L (ref 96–112)
Creatinine, Ser: 0.73 mg/dL (ref 0.40–1.20)
GFR: 80.97 mL/min (ref >60.00)
Glucose, Bld: 100 mg/dL — ABNORMAL HIGH (ref 70–99)
Potassium: 3.9 mEq/L (ref 3.5–5.1)
Sodium: 142 mEq/L (ref 135–145)

## 2019-06-09 LAB — LIPID PANEL
Cholesterol: 190 mg/dL (ref 0–200)
HDL: 72.5 mg/dL (ref >39.00)
LDL Cholesterol: 104 mg/dL — ABNORMAL HIGH (ref 0–99)
NonHDL: 117.69
Total CHOL/HDL Ratio: 3
Triglycerides: 68 mg/dL (ref 0.0–149.0)
VLDL: 13.6 mg/dL (ref 0.0–40.0)

## 2019-06-09 LAB — HEPATIC FUNCTION PANEL
ALT: 15 U/L (ref 0–35)
AST: 19 U/L (ref 0–37)
Albumin: 4.2 g/dL (ref 3.5–5.2)
Alkaline Phosphatase: 96 U/L (ref 39–117)
Bilirubin, Direct: 0.1 mg/dL (ref 0.0–0.3)
Total Bilirubin: 0.4 mg/dL (ref 0.2–1.2)
Total Protein: 6.5 g/dL (ref 6.0–8.3)

## 2019-06-09 LAB — HEMOGLOBIN A1C: Hgb A1c MFr Bld: 6.2 % (ref 4.6–6.5)

## 2019-06-09 NOTE — Progress Notes (Addendum)
Patient presented today for Shingles vaccine.  Couldn't find record of order in pt's chart.  Verbal orders given by Dr. Nicki Reaper for pt to get vaccine.  Reviewed.  Dr Nicki Reaper

## 2019-06-09 NOTE — Telephone Encounter (Signed)
Patient wants to know how long should she wait to between shingles vaccine to get the flu shot.  Please advise.

## 2019-06-10 NOTE — Telephone Encounter (Signed)
Pt aware.

## 2019-06-10 NOTE — Telephone Encounter (Signed)
Would recommend a couple of weeks.

## 2019-06-11 ENCOUNTER — Encounter: Payer: Self-pay | Admitting: Internal Medicine

## 2019-06-19 ENCOUNTER — Encounter: Payer: Self-pay | Admitting: Internal Medicine

## 2019-06-20 NOTE — Telephone Encounter (Signed)
Please contact [facility] to send [place] a prescription for this.  Thanks

## 2019-07-12 ENCOUNTER — Encounter: Payer: Self-pay | Admitting: Internal Medicine

## 2019-07-12 DIAGNOSIS — K649 Unspecified hemorrhoids: Secondary | ICD-10-CM

## 2019-07-12 NOTE — Telephone Encounter (Signed)
I am unable to find this pharmacy.  See her my chart message.  I want to send in anusol [facility] suppositories - one per rectum bid #14 with no refills.  Also, need to know if she has a preference of which surgeon she wants to see.

## 2019-07-13 ENCOUNTER — Other Ambulatory Visit: Payer: Self-pay

## 2019-07-13 MED ORDER — HYDROCORTISONE ACETATE 25 MG RE SUPP: 25.0000 mg | Freq: Two times a day (BID) | RECTAL | 0 refills | Status: DC

## 2019-07-13 NOTE — Telephone Encounter (Signed)
Order placed for surgery referral.  

## 2019-08-01 ENCOUNTER — Encounter: Payer: Self-pay | Admitting: Internal Medicine

## 2019-09-07 ENCOUNTER — Ambulatory Visit: Payer: No Typology Code available for payment source

## 2019-09-09 ENCOUNTER — Other Ambulatory Visit: Payer: Self-pay | Admitting: Adult Health

## 2019-09-12 ENCOUNTER — Other Ambulatory Visit: Payer: Self-pay | Admitting: Internal Medicine

## 2019-10-06 ENCOUNTER — Ambulatory Visit (INDEPENDENT_AMBULATORY_CARE_PROVIDER_SITE_OTHER): Payer: No Typology Code available for payment source | Admitting: Internal Medicine

## 2019-10-06 ENCOUNTER — Encounter: Payer: Self-pay | Admitting: Internal Medicine

## 2019-10-06 ENCOUNTER — Other Ambulatory Visit: Payer: Self-pay

## 2019-10-06 VITALS — Ht 67.0 in | Wt 150.1 lb

## 2019-10-06 DIAGNOSIS — R739 Hyperglycemia, unspecified: Secondary | ICD-10-CM

## 2019-10-06 DIAGNOSIS — M545 Low back pain, unspecified: Secondary | ICD-10-CM

## 2019-10-06 DIAGNOSIS — D638 Anemia in other chronic diseases classified elsewhere: Secondary | ICD-10-CM | POA: Diagnosis not present

## 2019-10-06 DIAGNOSIS — F5104 Psychophysiologic insomnia: Secondary | ICD-10-CM

## 2019-10-06 DIAGNOSIS — D696 Thrombocytopenia, unspecified: Secondary | ICD-10-CM | POA: Diagnosis not present

## 2019-10-06 DIAGNOSIS — F439 Reaction to severe stress, unspecified: Secondary | ICD-10-CM

## 2019-10-06 NOTE — Progress Notes (Signed)
Patient ID: Kimberly Figueroa, female   DOB: 26-Feb-1956, 64 y.o.   MRN: [id number]   Virtual Visit via video Note  This visit type was conducted due to national recommendations for restrictions regarding the COVID-19 pandemic (e.g. social distancing).  This format is felt to be most appropriate for this patient at this time.  All issues noted in this document were discussed and addressed.  No physical exam was performed (except for noted visual exam findings with Video Visits).   I connected with Domitila Stetler today by a video enabled telemedicine application and verified that I am speaking with the correct person using two identifiers. Location patient: home Location provider: work Persons participating in the virtual visit: patient, provider  The limitations, risks, security and privacy concerns of performing an evaluation and management service by video and the availability of in person appointments have been discussed. The patient expressed understanding and agreed to proceed.   Reason for visit: scheduled follow up.    HPI: She reports she is doing relatively well.  Is sleeping better.  Working.  Not exercising as much.  Discussed increasing and maintaining exercise.  Previous elevated a1c.  Discussed low carb diet.  No chest pain or sob.  No acid reflux or abdominal pain reported.  Had second covid vaccine.  Had headache for a couple of days and one day of diarrhea.  No problems since.  Bowels stable.  Back is doing better.  Followed at pain clinic.  Has standing desk now.  Helps.  Due to get second shingrx vaccine beginning of march.  Felt bad with first - aching, fever, nausea and vomiting.  Denies any true allergic reaction - specifically no swelling or difficulty breathing or rash.  Overall handling stress well.  Has f/u mammogram scheduled.  Colonoscopy 2015.  Recommended f/u per pt is 10 years.     ROS: See pertinent positives and negatives per HPI.  Past Medical History:    Diagnosis Date  . Endometriosis   . History of frequent urinary tract infections    s/p bladder dilatation (11/05)  . Hypersomnia, recurrent 06/28/2014  . ITP (idiopathic thrombocytopenic purpura)   . Pneumonia     Past Surgical History:  Procedure Laterality Date  . bladder dilatation  11/05  . CESAREAN SECTION  1993  . COLONOSCOPY    . laproscopic surgery  10/06   found to have endometriosis  . SHOULDER SURGERY Left 2008  . TONSILLECTOMY  1961  . VAGINAL DELIVERY  1987  . VAGINAL HYSTERECTOMY  02/07    Family History  Problem Relation Age of Onset  . Alcohol abuse Mother   . Varicose Veins Mother   . Diabetes Father   . Hearing loss Father   . Heart disease Father   . Crohn's disease Father   . Crohn's disease Paternal Grandfather   . Breast cancer Neg Hx   . Colon cancer Neg Hx     SOCIAL HX: reviewed.    Current Outpatient Medications:  .  zolpidem (AMBIEN) 10 MG tablet, TAKE 1/2 TO 1 TABLET BY MOUTH AT BEDTIME AS NEEDED FOR SLEEP., Disp: 30 tablet, Rfl: 0 .  citalopram (CELEXA) 40 MG tablet, TAKE 1 TABLET (40 MG TOTAL) BY MOUTH DAILY., Disp: 90 tablet, Rfl: 1 .  cyclobenzaprine (FLEXERIL) 5 MG tablet, TAKE 1 TABLET BY MOUTH AT BEDTIME AS NEEDED FOR MUSCLE SPASM. CAN CAUSE DROWSINESS., Disp: 20 tablet, Rfl: 0 .  doxycycline (PERIOSTAT) 20 MG tablet, Take 1 tablet by mouth  daily., Disp: , Rfl:  .  fluticasone (FLONASE) 50 MCG/ACT nasal spray, , Disp: , Rfl:  .  hydrocortisone (ANUSOL-HC) 25 MG suppository, Place 1 suppository (25 mg total) rectally 2 (two) times daily., Disp: 14 suppository, Rfl: 0 .  metroNIDAZOLE (METROCREAM) 0.75 % cream, , Disp: , Rfl:  .  Misc Natural Products (PROGESTERONE EX), Apply 1 mL topically daily. For 6 days out of the week., Disp: , Rfl:  .  modafinil (PROVIGIL) 100 MG tablet, Take 1 tablet (100 mg total) by mouth daily., Disp: 90 tablet, Rfl: 1 .  Multiple Vitamins-Minerals (MULTIVITAMIN GUMMIES ADULTS PO), Take by mouth.  VitaCraves, Disp: , Rfl:  .  Progesterone Micronized 10 % CREA, Place onto the skin., Disp: , Rfl:  .  SOOLANTRA 1 % CREA, , Disp: , Rfl:  .  valACYclovir (VALTREX) 1000 MG tablet, , Disp: , Rfl:   EXAM:  GENERAL: alert, oriented, appears well and in no acute distress  HEENT: atraumatic, conjunttiva clear, no obvious abnormalities on inspection of external nose and ears  NECK: normal movements of the head and neck  LUNGS: on inspection no signs of respiratory distress, breathing rate appears normal, no obvious gross SOB, gasping or wheezing  CV: no obvious cyanosis  PSYCH/NEURO: pleasant and cooperative, no obvious depression or anxiety, speech and thought processing grossly intact  ASSESSMENT AND PLAN:  Discussed the following assessment and plan:  Anemia Follow cbc.  Colonoscopy 2015.  Recommended f/u in 10 years.    Back pain Saw Dr Maryjean Ka.  Has standing test.  Better.  Follow.    Chronic insomnia Followed by neurology.  Stable.   Hyperglycemia Low carb diet and exercise.  Follow met b and a1c.   Stress Overall handling things relatively well.  Follow.   Thrombocytopenia Has ITP.  Has been worked up by hematology.  Recheck cbc.    Orders Placed This Encounter  Procedures  . CBC with Differential/Platelet    Standing Status:   Future    Standing Expiration Date:   10/05/2020  . Hemoglobin A1c    Standing Status:   Future    Standing Expiration Date:   10/05/2020  . Hepatic function panel    Standing Status:   Future    Standing Expiration Date:   10/05/2020  . Lipid panel    Standing Status:   Future    Standing Expiration Date:   10/05/2020  . Basic metabolic panel    Standing Status:   Future    Standing Expiration Date:   10/05/2020    No orders of the defined types were placed in this encounter.    I discussed the assessment and treatment plan with the patient. The patient was provided an opportunity to ask questions and all were answered. The patient  agreed with the plan and demonstrated an understanding of the instructions.   The patient was advised to call back or seek an in-person evaluation if the symptoms worsen or if the condition fails to improve as anticipated.   Einar Pheasant, MD

## 2019-10-09 ENCOUNTER — Encounter: Payer: Self-pay | Admitting: Internal Medicine

## 2019-10-09 NOTE — Assessment & Plan Note (Signed)
Overall handling things relatively well.  Follow.  

## 2019-10-09 NOTE — Assessment & Plan Note (Signed)
Followed by neurology.  Stable  

## 2019-10-09 NOTE — Assessment & Plan Note (Signed)
Low carb diet and exercise.  Follow met b and a1c.  

## 2019-10-09 NOTE — Assessment & Plan Note (Signed)
Has ITP.  Has been worked up by hematology.  Recheck cbc.

## 2019-10-09 NOTE — Assessment & Plan Note (Signed)
Follow cbc.  Colonoscopy 2015.  Recommended f/u in 10 years.

## 2019-10-09 NOTE — Assessment & Plan Note (Signed)
Saw Dr Maryjean Ka.  Has standing test.  Better.  Follow.

## 2019-10-11 ENCOUNTER — Ambulatory Visit: Payer: No Typology Code available for payment source

## 2019-10-21 ENCOUNTER — Other Ambulatory Visit: Payer: Self-pay | Admitting: Adult Health

## 2019-10-25 ENCOUNTER — Other Ambulatory Visit: Payer: Self-pay | Admitting: Adult Health

## 2019-10-25 ENCOUNTER — Ambulatory Visit: Payer: No Typology Code available for payment source

## 2019-10-25 ENCOUNTER — Telehealth: Payer: Self-pay | Admitting: *Deleted

## 2019-10-25 ENCOUNTER — Other Ambulatory Visit: Payer: Self-pay

## 2019-10-25 ENCOUNTER — Other Ambulatory Visit (INDEPENDENT_AMBULATORY_CARE_PROVIDER_SITE_OTHER): Payer: No Typology Code available for payment source

## 2019-10-25 DIAGNOSIS — R739 Hyperglycemia, unspecified: Secondary | ICD-10-CM | POA: Diagnosis not present

## 2019-10-25 DIAGNOSIS — D638 Anemia in other chronic diseases classified elsewhere: Secondary | ICD-10-CM

## 2019-10-25 DIAGNOSIS — D696 Thrombocytopenia, unspecified: Secondary | ICD-10-CM | POA: Diagnosis not present

## 2019-10-25 LAB — CBC WITH DIFFERENTIAL/PLATELET
Basophils Absolute: 0.1 10*3/uL (ref 0.0–0.1)
Basophils Relative: 1.4 % (ref 0.0–3.0)
Eosinophils Absolute: 0.1 10*3/uL (ref 0.0–0.7)
Eosinophils Relative: 3.3 % (ref 0.0–5.0)
HCT: 38.4 % (ref 36.0–46.0)
Hemoglobin: 12.4 g/dL (ref 12.0–15.0)
Lymphocytes Relative: 31.6 % (ref 12.0–46.0)
Lymphs Abs: 1.2 10*3/uL (ref 0.7–4.0)
MCHC: 32.4 g/dL (ref 30.0–36.0)
MCV: 91.7 fl (ref 78.0–100.0)
Monocytes Absolute: 0.3 10*3/uL (ref 0.1–1.0)
Monocytes Relative: 7 % (ref 3.0–12.0)
Neutro Abs: 2.1 10*3/uL (ref 1.4–7.7)
Neutrophils Relative %: 56.7 % (ref 43.0–77.0)
Platelets: 136 10*3/uL — ABNORMAL LOW (ref 150.0–400.0)
RBC: 4.19 Mil/uL (ref 3.87–5.11)
RDW: 14.4 % (ref 11.5–15.5)
WBC: 3.8 10*3/uL — ABNORMAL LOW (ref 4.0–10.5)

## 2019-10-25 LAB — HEPATIC FUNCTION PANEL
ALT: 13 U/L (ref 0–35)
AST: 19 U/L (ref 0–37)
Albumin: 4 g/dL (ref 3.5–5.2)
Alkaline Phosphatase: 79 U/L (ref 39–117)
Bilirubin, Direct: 0.1 mg/dL (ref 0.0–0.3)
Total Bilirubin: 0.3 mg/dL (ref 0.2–1.2)
Total Protein: 6.6 g/dL (ref 6.0–8.3)

## 2019-10-25 LAB — BASIC METABOLIC PANEL
BUN: 11 mg/dL (ref 6–23)
CO2: 31 mEq/L (ref 19–32)
Calcium: 9 mg/dL (ref 8.4–10.5)
Chloride: 105 mEq/L (ref 96–112)
Creatinine, Ser: 0.72 mg/dL (ref 0.40–1.20)
GFR: 82.17 mL/min (ref >60.00)
Glucose, Bld: 92 mg/dL (ref 70–99)
Potassium: 3.7 mEq/L (ref 3.5–5.1)
Sodium: 141 mEq/L (ref 135–145)

## 2019-10-25 LAB — HEMOGLOBIN A1C: Hgb A1c MFr Bld: 5.8 % (ref 4.6–6.5)

## 2019-10-25 LAB — LIPID PANEL
Cholesterol: 162 mg/dL (ref 0–200)
HDL: 65.8 mg/dL (ref >39.00)
LDL Cholesterol: 85 mg/dL (ref 0–99)
NonHDL: 95.74
Total CHOL/HDL Ratio: 2
Triglycerides: 55 mg/dL (ref 0.0–149.0)
VLDL: 11 mg/dL (ref 0.0–40.0)

## 2019-10-25 NOTE — Telephone Encounter (Signed)
Pt came in for labs this morning & mentioned that she was also here for a shingles vaccine. She also mentioned that she had her last COVID vaccine on 09/26/19 & she had a pretty bad reaction to her first shingles vaccine. I recommended after talking to Wannetta Sender that we reschedule out 2 more weeks to be sure to give her the recommended 45 days after her last COVID vaccine.  Pt agreed & felt better about waiting a little longer.

## 2019-10-26 ENCOUNTER — Other Ambulatory Visit: Payer: Self-pay | Admitting: Internal Medicine

## 2019-10-26 DIAGNOSIS — D72819 Decreased white blood cell count, unspecified: Secondary | ICD-10-CM

## 2019-10-26 NOTE — Progress Notes (Signed)
Order placed for f/u cbc.   

## 2019-11-23 ENCOUNTER — Other Ambulatory Visit: Payer: Self-pay

## 2019-11-23 ENCOUNTER — Ambulatory Visit (INDEPENDENT_AMBULATORY_CARE_PROVIDER_SITE_OTHER): Payer: No Typology Code available for payment source | Admitting: Dermatology

## 2019-11-23 DIAGNOSIS — L719 Rosacea, unspecified: Secondary | ICD-10-CM

## 2019-11-23 DIAGNOSIS — L820 Inflamed seborrheic keratosis: Secondary | ICD-10-CM | POA: Diagnosis not present

## 2019-11-23 LAB — HM MAMMOGRAPHY

## 2019-11-23 NOTE — Patient Instructions (Addendum)
Doxycycline should be taken with food to prevent nausea. Do not lay down for 30 minutes after taking. Be cautious with sun exposure and use good sun protection while on this medication. Pregnant women should not take this medication.   Recommend daily broad spectrum sunscreen SPF 30+ to sun-exposed areas, reapply every 2 hours as needed. Call for new or changing lesions.  Discussed HelioCare for added sun protection.

## 2019-11-23 NOTE — Progress Notes (Signed)
   Follow-Up Visit   Subjective  Kimberly Figueroa is a 64 y.o. female who presents for the following: Rosacea (3 month follow up. Taking doxycycline 20mg  qd, sulfacleanse qd, Soolantra qd. Had BBL with Sonia Baller 10/17/19. Pt notices nose is still red but bumps have improved. ) and spot (Neck, present for about 1 year. Irritated, sometimes itches. ).  The following portions of the chart were reviewed this encounter and updated as appropriate: Tobacco  Allergies  Meds  Problems  Med Hx  Surg Hx  Fam Hx      Review of Systems: No other skin or systemic complaints.  Objective  Well appearing patient in no apparent distress; mood and affect are within normal limits.  A focused examination was performed including face, chest, neck. Relevant physical exam findings are noted in the Assessment and Plan.  Objective  Neck - Anterior: Erythematous keratotic or waxy stuck-on papule or plaque.   Objective  face: Few small inflammatory papules nose and chin  Assessment & Plan  Inflamed seborrheic keratosis Neck - Anterior  Benign, observe.   Patient defers treatment with LN2.  Rosacea face  Chronic, not at goal  Cont doxycycline 20mg  increasing back to BID. D/C SulfaCleanse D/C Soolantra  Start SkinMedicinals  Azelaic Acid: 15% Ivermectin: 1% Metronidazole: 1% BID   Doxycycline should be taken with food to prevent nausea. Do not lay down for 30 minutes after taking. Be cautious with sun exposure and use good sun protection while on this medication. Pregnant women should not take this medication.   May schedule for additional BBL treatment. Pt does have grittiness of the eyes. If not improved on doxycycline 20mg  BID, recommend pt discuss with eye dr.   Return in about 6 months (around 05/24/2020) for Rosacea.   Kimberly Figueroa, RMA, am acting as scribe for Forest Gleason, MD .

## 2019-11-24 ENCOUNTER — Ambulatory Visit (INDEPENDENT_AMBULATORY_CARE_PROVIDER_SITE_OTHER): Payer: No Typology Code available for payment source

## 2019-11-24 ENCOUNTER — Other Ambulatory Visit: Payer: No Typology Code available for payment source

## 2019-11-24 DIAGNOSIS — D72819 Decreased white blood cell count, unspecified: Secondary | ICD-10-CM

## 2019-11-24 DIAGNOSIS — Z23 Encounter for immunization: Secondary | ICD-10-CM

## 2019-11-24 LAB — CBC WITH DIFFERENTIAL/PLATELET
Basophils Absolute: 0 10*3/uL (ref 0.0–0.1)
Basophils Relative: 1.1 % (ref 0.0–3.0)
Eosinophils Absolute: 0.1 10*3/uL (ref 0.0–0.7)
Eosinophils Relative: 2.6 % (ref 0.0–5.0)
HCT: 37.1 % (ref 36.0–46.0)
Hemoglobin: 12.3 g/dL (ref 12.0–15.0)
Lymphocytes Relative: 31.9 % (ref 12.0–46.0)
Lymphs Abs: 1.3 10*3/uL (ref 0.7–4.0)
MCHC: 33.1 g/dL (ref 30.0–36.0)
MCV: 92.2 fl (ref 78.0–100.0)
Monocytes Absolute: 0.4 10*3/uL (ref 0.1–1.0)
Monocytes Relative: 8.7 % (ref 3.0–12.0)
Neutro Abs: 2.3 10*3/uL (ref 1.4–7.7)
Neutrophils Relative %: 55.7 % (ref 43.0–77.0)
Platelets: 152 10*3/uL (ref 150.0–400.0)
RBC: 4.02 Mil/uL (ref 3.87–5.11)
RDW: 14.1 % (ref 11.5–15.5)
WBC: 4.1 10*3/uL (ref 4.0–10.5)

## 2019-11-24 NOTE — Progress Notes (Addendum)
Patient presented for shingrix injection to left deltoid, patient voiced no concerns nor showed any signs of distress during injection.  Reviewed.  Dr Scott 

## 2019-11-26 ENCOUNTER — Encounter: Payer: Self-pay | Admitting: Internal Medicine

## 2019-11-29 ENCOUNTER — Encounter: Payer: Self-pay | Admitting: Dermatology

## 2019-12-06 ENCOUNTER — Encounter: Payer: Self-pay | Admitting: Ophthalmology

## 2019-12-06 ENCOUNTER — Ambulatory Visit: Payer: No Typology Code available for payment source | Admitting: Adult Health

## 2019-12-06 ENCOUNTER — Other Ambulatory Visit: Payer: Self-pay

## 2019-12-06 NOTE — Anesthesia Preprocedure Evaluation (Addendum)
Anesthesia Evaluation  Patient identified by MRN, date of birth, ID band Patient awake    Reviewed: Allergy & Precautions, NPO status , Patient's Chart, lab work & pertinent test results, reviewed documented beta blocker date and time   History of Anesthesia Complications Negative for: history of anesthetic complications  Airway Mallampati: II  TM Distance: >3 FB Neck ROM: Full    Dental   Pulmonary    breath sounds clear to auscultation       Cardiovascular (-) angina(-) DOE  Rhythm:Regular Rate:Normal     Neuro/Psych    GI/Hepatic neg GERD  ,  Endo/Other   Endometriosis  Renal/GU      Musculoskeletal   Abdominal   Peds  Hematology  (+) Blood dyscrasia (Idiopathic thrombocytopenic purpura), anemia ,   Anesthesia Other Findings   Reproductive/Obstetrics                             Anesthesia Physical  Anesthesia Plan  ASA: II  Anesthesia Plan: MAC   Post-op Pain Management:    Induction: Intravenous  PONV Risk Score and Plan: 2 and TIVA, Midazolam and Treatment may vary due to age or medical condition  Airway Management Planned: Nasal Cannula  Additional Equipment:   Intra-op Plan:   Post-operative Plan:   Informed Consent: I have reviewed the patients History and Physical, chart, labs and discussed the procedure including the risks, benefits and alternatives for the proposed anesthesia with the patient or authorized representative who has indicated his/her understanding and acceptance.       Plan Discussed with: CRNA and Anesthesiologist  Anesthesia Plan Comments:         Anesthesia Quick Evaluation  

## 2019-12-09 ENCOUNTER — Other Ambulatory Visit
Admission: RE | Admit: 2019-12-09 | Discharge: 2019-12-09 | Disposition: A | Discharge status: Home / Self Care | Payer: No Typology Code available for payment source | Source: Ambulatory Visit | Attending: Ophthalmology | Admitting: Ophthalmology

## 2019-12-09 DIAGNOSIS — Z20822 Contact with and (suspected) exposure to covid-19: Secondary | ICD-10-CM | POA: Diagnosis not present

## 2019-12-09 DIAGNOSIS — Z01812 Encounter for preprocedural laboratory examination: Secondary | ICD-10-CM | POA: Insufficient documentation

## 2019-12-09 LAB — SARS CORONAVIRUS 2 (TAT 6-24 HRS): SARS Coronavirus 2: NEGATIVE

## 2019-12-09 NOTE — Discharge Instructions (Signed)

## 2019-12-13 ENCOUNTER — Ambulatory Visit
Admission: RE | Admit: 2019-12-13 | Discharge: 2019-12-13 | Disposition: A | Discharge status: Home / Self Care | Payer: No Typology Code available for payment source | Attending: Ophthalmology | Admitting: Ophthalmology

## 2019-12-13 ENCOUNTER — Other Ambulatory Visit: Payer: Self-pay

## 2019-12-13 ENCOUNTER — Encounter
Admission: RE | Disposition: A | Discharge status: Home / Self Care | Payer: Self-pay | Source: Home / Self Care | Attending: Ophthalmology

## 2019-12-13 ENCOUNTER — Ambulatory Visit: Payer: No Typology Code available for payment source | Admitting: Anesthesiology

## 2019-12-13 ENCOUNTER — Encounter: Payer: Self-pay | Admitting: Ophthalmology

## 2019-12-13 DIAGNOSIS — H2511 Age-related nuclear cataract, right eye: Secondary | ICD-10-CM | POA: Diagnosis not present

## 2019-12-13 DIAGNOSIS — Z79899 Other long term (current) drug therapy: Secondary | ICD-10-CM | POA: Insufficient documentation

## 2019-12-13 HISTORY — PX: CATARACT EXTRACTION W/PHACO: SHX586

## 2019-12-13 SURGERY — PHACOEMULSIFICATION, CATARACT, WITH IOL INSERTION
Anesthesia: Monitor Anesthesia Care | Site: Eye | Laterality: Right

## 2019-12-13 MED ORDER — EPINEPHRINE PF 1 MG/ML IJ SOLN
INTRAOCULAR | Status: DC | PRN
  Administered 2019-12-13: 52 mL via OPHTHALMIC

## 2019-12-13 MED ORDER — MOXIFLOXACIN HCL 0.5 % OP SOLN
OPHTHALMIC | Status: DC | PRN
  Administered 2019-12-13: 0.2 mL via OPHTHALMIC

## 2019-12-13 MED ORDER — LACTATED RINGERS IV SOLN: 100.0000 mL/h | INTRAVENOUS | Status: DC

## 2019-12-13 MED ORDER — MIDAZOLAM HCL 2 MG/2ML IJ SOLN
INTRAMUSCULAR | Status: DC | PRN
  Administered 2019-12-13: 2 mg via INTRAVENOUS

## 2019-12-13 MED ORDER — LIDOCAINE HCL (PF) 2 % IJ SOLN
INTRAOCULAR | Status: DC | PRN
  Administered 2019-12-13: 2 mL

## 2019-12-13 MED ORDER — BRIMONIDINE TARTRATE-TIMOLOL 0.2-0.5 % OP SOLN
OPHTHALMIC | Status: DC | PRN
  Administered 2019-12-13: 1 [drp] via OPHTHALMIC

## 2019-12-13 MED ORDER — FENTANYL CITRATE (PF) 100 MCG/2ML IJ SOLN
INTRAMUSCULAR | Status: DC | PRN
  Administered 2019-12-13: 50 ug via INTRAVENOUS

## 2019-12-13 MED ORDER — TETRACAINE HCL 0.5 % OP SOLN
1.0000 [drp] | OPHTHALMIC | Status: DC | PRN
  Administered 2019-12-13 (×3): 1 [drp] via OPHTHALMIC

## 2019-12-13 MED ORDER — ARMC OPHTHALMIC DILATING DROPS
1.0000 "application " | OPHTHALMIC | Status: DC | PRN
  Administered 2019-12-13 (×3): 1 via OPHTHALMIC

## 2019-12-13 MED ORDER — NA CHONDROIT SULF-NA HYALURON 40-17 MG/ML IO SOLN
INTRAOCULAR | Status: DC | PRN
  Administered 2019-12-13: 1 mL via INTRAOCULAR

## 2019-12-13 SURGICAL SUPPLY — 22 items
CANNULA ANT/CHMB 27G (MISCELLANEOUS) ×2 IMPLANT
CANNULA ANT/CHMB 27GA (MISCELLANEOUS) ×4 IMPLANT
DISSECTOR HYDRO NUCLEUS 50X22 (MISCELLANEOUS) ×2 IMPLANT
GLOVE SURG LX 8.0 MICRO (GLOVE) ×1
GLOVE SURG LX STRL 8.0 MICRO (GLOVE) ×1 IMPLANT
GLOVE SURG TRIUMPH 8.0 PF LTX (GLOVE) ×2 IMPLANT
GOWN STRL REUS W/ TWL LRG LVL3 (GOWN DISPOSABLE) ×2 IMPLANT
GOWN STRL REUS W/TWL LRG LVL3 (GOWN DISPOSABLE) ×2
LENS IOL ACRSF VT TRC 415 24.0 IMPLANT
LENS IOL ACRYSOF VIVITY 24.0 ×1 IMPLANT
LENS IOL VIVITY 415 24.0 ×1 IMPLANT
MARKER SKIN DUAL TIP RULER LAB (MISCELLANEOUS) ×2 IMPLANT
NDL FILTER BLUNT 18X1 1/2 (NEEDLE) ×1 IMPLANT
NEEDLE FILTER BLUNT 18X 1/2SAF (NEEDLE) ×1
NEEDLE FILTER BLUNT 18X1 1/2 (NEEDLE) ×1 IMPLANT
PACK EYE AFTER SURG (MISCELLANEOUS) ×2 IMPLANT
PACK OPTHALMIC (MISCELLANEOUS) ×2 IMPLANT
PACK PORFILIO (MISCELLANEOUS) ×2 IMPLANT
SYR 3ML LL SCALE MARK (SYRINGE) ×2 IMPLANT
SYR TB 1ML LUER SLIP (SYRINGE) ×2 IMPLANT
WATER STERILE IRR 250ML POUR (IV SOLUTION) ×2 IMPLANT
WIPE NON LINTING 3.25X3.25 (MISCELLANEOUS) ×2 IMPLANT

## 2019-12-13 NOTE — Transfer of Care (Signed)
Immediate Anesthesia Transfer of Care Note  Patient: Kimberly Figueroa  Procedure(s) Performed: CATARACT EXTRACTION PHACO AND INTRAOCULAR LENS PLACEMENT (IOC) RIGHT VIVITY TORIC LENS 4.35 00:30.2 (Right Eye)  Patient Location: PACU  Anesthesia Type: MAC  Level of Consciousness: awake, alert  and patient cooperative  Airway and Oxygen Therapy: Patient Spontanous Breathing and Patient connected to supplemental oxygen  Post-op Assessment: Post-op Vital signs reviewed, Patient's Cardiovascular Status Stable, Respiratory Function Stable, Patent Airway and No signs of Nausea or vomiting  Post-op Vital Signs: Reviewed and stable  Complications: No apparent anesthesia complications

## 2019-12-13 NOTE — Anesthesia Procedure Notes (Signed)
Procedure Name: MAC Date/Time: 12/13/2019 12:16 PM Performed by: Jeannene Patella, CRNA Pre-anesthesia Checklist: Patient identified, Emergency Drugs available, Suction available, Patient being monitored and Timeout performed Patient Re-evaluated:Patient Re-evaluated prior to induction Oxygen Delivery Method: Nasal cannula

## 2019-12-13 NOTE — Anesthesia Postprocedure Evaluation (Signed)
Anesthesia Post Note  Patient: Kimberly Figueroa  Procedure(s) Performed: CATARACT EXTRACTION PHACO AND INTRAOCULAR LENS PLACEMENT (IOC) RIGHT VIVITY TORIC LENS 4.35 00:30.2 (Right Eye)     Patient location during evaluation: PACU Anesthesia Type: MAC Level of consciousness: awake and alert Pain management: pain level controlled Vital Signs Assessment: post-procedure vital signs reviewed and stable Respiratory status: spontaneous breathing, nonlabored ventilation, respiratory function stable and patient connected to nasal cannula oxygen Cardiovascular status: stable and blood pressure returned to baseline Postop Assessment: no apparent nausea or vomiting Anesthetic complications: no    Lenae Wherley A  Rynlee [place]

## 2019-12-13 NOTE — H&P (Signed)
All labs reviewed. Abnormal studies sent to patients PCP when indicated.  Previous H&P reviewed, patient examined, there are NO CHANGES.  Kimberly Minkler Porfilio4/20/202111:23 AM

## 2019-12-13 NOTE — Op Note (Signed)
PREOPERATIVE DIAGNOSIS:  Nuclear sclerotic cataract of the right eye.   POSTOPERATIVE DIAGNOSIS:  Nuclear sclerotic cataract of the right eye.   OPERATIVE PROCEDURE: Procedure(s): CATARACT EXTRACTION PHACO AND INTRAOCULAR LENS PLACEMENT (IOC) RIGHT VIVITY TORIC LENS 4.35 00:30.2   SURGEON:  Birder Robson, MD.   ANESTHESIA: 1.      Managed anesthesia care. 2.     0.71ml of Shugarcaine was instilled following the paracentesis  Anesthesiologist: Heniser, Fredric Dine, MD CRNA: Mayme Genta, CRNA; Jeannene Patella, CRNA  COMPLICATIONS:  None.   TECHNIQUE:   Stop and chop    DESCRIPTION OF PROCEDURE:  The patient was examined and consented in the preoperative holding area where the aforementioned topical anesthesia was applied to the right eye.  The patient was brought back to the Operating Room where he was sat upright on the gurney and given a target to fixate upon while the eye was marked at the 3:00 and 9:00 position.  The patient was then reclined on the operating table.  The eye was prepped and draped in the usual sterile ophthalmic fashion and a lid speculum was placed. A paracentesis was created with the side port blade and the anterior chamber was filled with viscoelastic. A near clear corneal incision was performed with the steel keratome. A continuous curvilinear capsulorrhexis was performed with a cystotome followed by the capsulorrhexis forceps. Hydrodissection and hydrodelineation were carried out with BSS on a blunt cannula. The lens was removed in a stop and chop technique and the remaining cortical material was removed with the irrigation-aspiration handpiece. The eye was inflated with viscoelastic and the Vivity Toric lens  was placed in the eye and rotated to within a few degrees of the predetermined orientation.  The remaining viscoelastic was removed from the eye.  The Sinskey hook was used to rotate the toric lens into its final resting place at 075 degrees.  0. The eye was  inflated to a physiologic pressure and found to be watertight. 0.62ml of Vigamox was placed in the anterior chamber.  The eye was dressed with Vigamox. The patient was given protective glasses to wear throughout the day and a shield with which to sleep tonight. The patient was also given drops with which to begin a drop regimen today and will follow-up with me in one day. Implant Name Type Inv. Item Serial No. Manufacturer Lot No. LRB No. Used Action  LENS ACRYSOF VIVITY TORIC 24.0 - [id number]  LENS ACRYSOF VIVITY TORIC 24.0 [id number] ALCON  Right 1 Implanted   Procedure(s): CATARACT EXTRACTION PHACO AND INTRAOCULAR LENS PLACEMENT (IOC) RIGHT VIVITY TORIC LENS 4.35 00:30.2 (Right)  Electronically signed: Birder Robson 12/13/2019 12:34 PM

## 2019-12-13 NOTE — H&P (Signed)
All labs reviewed. Abnormal studies sent to patients PCP when indicated.  Previous H&P reviewed, patient examined, there are NO CHANGES.  Kimberly Delancey Porfilio4/20/202112:06 PM

## 2019-12-29 ENCOUNTER — Telehealth (INDEPENDENT_AMBULATORY_CARE_PROVIDER_SITE_OTHER): Payer: No Typology Code available for payment source | Admitting: Adult Health

## 2019-12-29 DIAGNOSIS — R400 Somnolence: Secondary | ICD-10-CM

## 2019-12-29 DIAGNOSIS — F5104 Psychophysiologic insomnia: Secondary | ICD-10-CM | POA: Diagnosis not present

## 2019-12-29 MED ORDER — MODAFINIL 100 MG PO TABS: 100.0000 mg | ORAL_TABLET | Freq: Every day | ORAL | 1 refills | Status: DC

## 2019-12-29 NOTE — Progress Notes (Signed)
PATIENT: Kimberly Figueroa DOB: [birth year]  REASON FOR VISIT: follow up HISTORY FROM: patient  Virtual Visit via Video Note  I connected with Kimberly Figueroa on 12/29/19 at  2:00 PM EDT by a video enabled telemedicine application located remotely at [facility] and verified that I am speaking with the correct person using two identifiers who was located at their own home.   I discussed the limitations of evaluation and management by telemedicine and the availability of in person appointments. The patient expressed understanding and agreed to proceed.   PATIENT: Kimberly Figueroa DOB: Jan 19, 1956  REASON FOR VISIT: follow up HISTORY FROM: patient  HISTORY OF PRESENT ILLNESS: Today 12/29/19:  Ms. Kimberly Figueroa is a 64 year old female with a history of chronic insomnia and daytime sleepiness.  She returns today for follow-up.  She reports that she continues to take half a tablet of Ambien most nights.  She states on occasion she will need to take the whole tablet.  She reports recently she has started using Provigil more during the day.  She states that this is working well for her.  Occasionally later in the day she may feel more tired.  She returns today for an evaluation  HISTORY 06/07/19 Kimberly Figueroa is a 64 y.o. female who has been followed in this office for chronic insomnia and daytime sleepiness.  She was initially scheduled for face-to-face office follow up visit today time but due to [place] rescheduled for a video visit however her camera would not work right so we converted to a non-face-to-face telephone visit with patients consent.   Patient reports that she has been doing fairly well.  She states that she takes Ambien a half a tablet at bedtime and typically she can get around 3 to 4 hours of sleep before she begins to wake up.  She states that when she wakes up she sleeps off and on for the rest of the night.  She states in  the past her prescription of Ambien was written to take a half a tablet to 1 tablet at bedtime.  She states that this typically works well as she would typically take a half a tablet and then maybe take an additional half a tablet if she woke up.  She reports that she does not have to use Provigil as often since she is working from home.  She denies any new issues.  She returns today for an evaluation.  REVIEW OF SYSTEMS: Out of a complete 14 system review of symptoms, the patient complains only of the following symptoms, and all other reviewed systems are negative.  See HPI  ALLERGIES: Allergies  Allergen Reactions  . Darvon [Propoxyphene] Nausea Only  . Vicodin [Hydrocodone-Acetaminophen] Itching    HOME MEDICATIONS: Outpatient Medications Prior to Visit  Medication Sig Dispense Refill  . citalopram (CELEXA) 40 MG tablet TAKE 1 TABLET (40 MG TOTAL) BY MOUTH DAILY. 90 tablet 1  . cyclobenzaprine (FLEXERIL) 5 MG tablet TAKE 1 TABLET BY MOUTH AT BEDTIME AS NEEDED FOR MUSCLE SPASM. CAN CAUSE DROWSINESS. 20 tablet 0  . doxycycline (PERIOSTAT) 20 MG tablet Take 1 tablet by mouth daily.    . fluticasone (FLONASE) 50 MCG/ACT nasal spray     . hydrocortisone (ANUSOL-HC) 25 MG suppository Place 1 suppository (25 mg total) rectally 2 (two) times daily. (Patient not taking: Reported on 12/06/2019) 14 suppository 0  . modafinil (PROVIGIL) 100 MG tablet Take 1 tablet (100 mg total) by mouth daily. (Patient  not taking: Reported on 12/06/2019) 90 tablet 1  . Multiple Vitamins-Minerals (MULTIVITAMIN GUMMIES ADULTS PO) Take by mouth. VitaCraves    . Progesterone Micronized 10 % CREA Place onto the skin.    . SOOLANTRA 1 % CREA     . UNABLE TO FIND 2 (two) times daily as needed. Skin Medicinals(cream):  Azelaic Acid 15%, Ivermectin 1%, Metronidazole 1%    . valACYclovir (VALTREX) 1000 MG tablet     . zolpidem (AMBIEN) 10 MG tablet TAKE 1/2 TO 1 TABLET BY MOUTH AT BEDTIME AS NEEDED FOR SLEEP. 30 tablet 1    No facility-administered medications prior to visit.    PAST MEDICAL HISTORY: Past Medical History:  Diagnosis Date  . Endometriosis   . History of frequent urinary tract infections    s/p bladder dilatation (11/05)  . Hypersomnia, recurrent 06/28/2014  . ITP (idiopathic thrombocytopenic purpura)   . Pneumonia   . Rosacea     PAST SURGICAL HISTORY: Past Surgical History:  Procedure Laterality Date  . bladder dilatation  11/05  . CATARACT EXTRACTION W/PHACO Right 12/13/2019   Procedure: CATARACT EXTRACTION PHACO AND INTRAOCULAR LENS PLACEMENT ([place]) RIGHT VIVITY TORIC LENS 4.35 00:30.2;  Surgeon: Birder Robson, MD;  Location: [place];  Service: Ophthalmology;  Laterality: Right;  . CESAREAN SECTION  1993  . COLONOSCOPY    . laproscopic surgery  10/06   found to have endometriosis  . SHOULDER SURGERY Left 2008  . TONSILLECTOMY  1961  . VAGINAL DELIVERY  1987  . VAGINAL HYSTERECTOMY  02/07    FAMILY HISTORY: Family History  Problem Relation Age of Onset  . Alcohol abuse Mother   . Varicose Veins Mother   . Diabetes Father   . Hearing loss Father   . Heart disease Father   . Crohn's disease Father   . Crohn's disease Paternal Grandfather   . Breast cancer Neg Hx   . Colon cancer Neg Hx     SOCIAL HISTORY: Social History   Socioeconomic History  . Marital status: Married    Spouse name: Gwyndolyn Saxon  . Number of children: 2  . Years of education: college  . Highest education level: Not on file  Occupational History  . Occupation: Advertising account planner. Director    Employer: [employer]  Tobacco Use  . 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 Smokeless tobacco: Never Used  Substance and Sexual Activity  . Alcohol use: Yes    Alcohol/week: 2.0 standard drinks    Types: 2 Glasses of wine per week  . Drug use: No  . Sexual activity: Not on file  Other Topics Concern  . Not on file  Social History Narrative   Patient consumes 6-8 cups tea daily, is right handed   Social  Determinants of Health   Financial Resource Strain:   . Difficulty of Paying Living Expenses:   Food Insecurity:   . Worried About Charity fundraiser in the Last Year:   . Arboriculturist in the Last Year:   Transportation Needs:   . Film/video editor (Medical):   Marland Kitchen Lack of Transportation (Non-Medical):   Physical Activity:   . Days of Exercise per Week:   . Minutes of Exercise per Session:   Stress:   . Feeling of Stress :   Social Connections:   . Frequency of Communication with Friends and Family:   . Frequency of Social Gatherings with Friends and Family:   . Attends Religious Services:   . Active Member  of Clubs or Organizations:   . Attends Archivist Meetings:   Marland Kitchen Marital Status:   Intimate Partner Violence:   . Fear of Current or Ex-Partner:   . Emotionally Abused:   Marland Kitchen Physically Abused:   . Sexually Abused:       PHYSICAL EXAM Generalized: Well developed, in no acute distress   Neurological examination  Mentation: Alert oriented to time, place, history taking. Follows all commands speech and language fluent Cranial nerve II-XII:Extraocular movements were full. Facial symmetry noted. uvula tongue midline. Head turning and shoulder shrug  were normal and symmetric. Motor: Good strength throughout subjectively per patient Sensory: Sensory testing is intact to soft touch on all 4 extremities subjectively per patient Coordination: Cerebellar testing reveals good finger-nose-finger  Gait and station: Patient is able to stand from a seated position. gait is normal.  Reflexes: UTA  DIAGNOSTIC DATA (LABS, IMAGING, TESTING) - I reviewed patient records, labs, notes, testing and imaging myself where available.  Lab Results  Component Value Date   WBC 4.1 11/24/2019   HGB 12.3 11/24/2019   HCT 37.1 11/24/2019   MCV 92.2 11/24/2019   PLT 152.0 11/24/2019      Component Value Date/Time   NA 141 10/25/2019 1001   K 3.7 10/25/2019 1001   CL 105  10/25/2019 1001   CO2 31 10/25/2019 1001   GLUCOSE 92 10/25/2019 1001   BUN 11 10/25/2019 1001   CREATININE 0.72 10/25/2019 1001   CALCIUM 9.0 10/25/2019 1001   PROT 6.6 10/25/2019 1001   ALBUMIN 4.0 10/25/2019 1001   AST 19 10/25/2019 1001   ALT 13 10/25/2019 1001   ALKPHOS 79 10/25/2019 1001   BILITOT 0.3 10/25/2019 1001   Lab Results  Component Value Date   CHOL 162 10/25/2019   HDL 65.80 10/25/2019   LDLCALC 85 10/25/2019   TRIG 55.0 10/25/2019   CHOLHDL 2 10/25/2019   Lab Results  Component Value Date   HGBA1C 5.8 10/25/2019   No results found for: VITAMINB12 Lab Results  Component Value Date   TSH 1.67 01/19/2019      ASSESSMENT AND PLAN 64 y.o. year old female  has a past medical history of Endometriosis, History of frequent urinary tract infections, Hypersomnia, recurrent (06/28/2014), ITP (idiopathic thrombocytopenic purpura), Pneumonia, and Rosacea. here with:  1.  Chronic insomnia  -Continue Ambien 1/2- 1 full tablet nightly.  2.  Daytime sleepiness  -Continue Provigil 100 mg daily as needed -We did discuss taking an additional 1/2-1 tablet at lunch if needed  Advised if her symptoms worsen or she develops new symptoms she should let us know.  She will follow-up in 6 months or sooner if needed  I spent 20 minutes of face-to-face and non-face-to-face time with patient.  This included previsit chart review, lab review, study review, order entry, electronic health record documentation, patient education.    Ward Givens, MSN, NP-C 12/29/2019, 2:10 PM [facility] [address], [place], [place] [address] [phone]

## 2020-01-02 ENCOUNTER — Encounter: Payer: Self-pay | Admitting: Ophthalmology

## 2020-01-02 ENCOUNTER — Other Ambulatory Visit: Payer: Self-pay

## 2020-01-06 ENCOUNTER — Other Ambulatory Visit
Admission: RE | Admit: 2020-01-06 | Discharge: 2020-01-06 | Disposition: A | Discharge status: Home / Self Care | Payer: No Typology Code available for payment source | Source: Ambulatory Visit | Attending: Ophthalmology | Admitting: Ophthalmology

## 2020-01-06 ENCOUNTER — Other Ambulatory Visit: Payer: Self-pay

## 2020-01-06 DIAGNOSIS — Z01812 Encounter for preprocedural laboratory examination: Secondary | ICD-10-CM | POA: Diagnosis not present

## 2020-01-06 DIAGNOSIS — Z20822 Contact with and (suspected) exposure to covid-19: Secondary | ICD-10-CM | POA: Insufficient documentation

## 2020-01-06 LAB — SARS CORONAVIRUS 2 (TAT 6-24 HRS): SARS Coronavirus 2: NEGATIVE

## 2020-01-09 NOTE — Discharge Instructions (Signed)
General Anesthesia, Adult, Care After This sheet gives you information about how to care for yourself after your procedure. Your health care provider may also give you more specific instructions. If you have problems or questions, contact your health care provider. What can I expect after the procedure? After the procedure, the following side effects are common:  Pain or discomfort at the IV site.  Nausea.  Vomiting.  Sore throat.  Trouble concentrating.  Feeling cold or chills.  Weak or tired.  Sleepiness and fatigue.  Soreness and body aches. These side effects can affect parts of the body that were not involved in surgery. Follow these instructions at home:  For at least 24 hours after the procedure:  Have a responsible adult stay with you. It is important to have someone help care for you until you are awake and alert.  Rest as needed.  Do not: ? Participate in activities in which you could fall or become injured. ? Drive. ? Use heavy machinery. ? Drink alcohol. ? Take sleeping pills or medicines that cause drowsiness. ? Make important decisions or sign legal documents. ? Take care of children on your own. Eating and drinking  Follow any instructions from your health care provider about eating or drinking restrictions.  When you feel hungry, start by eating small amounts of foods that are soft and easy to digest (bland), such as toast. Gradually return to your regular diet.  Drink enough fluid to keep your urine pale yellow.  If you vomit, rehydrate by drinking water, juice, or clear broth. General instructions  If you have sleep apnea, surgery and certain medicines can increase your risk for breathing problems. Follow instructions from your health care provider about wearing your sleep device: ? Anytime you are sleeping, including during daytime naps. ? While taking prescription pain medicines, sleeping medicines, or medicines that make you drowsy.  Return to  your normal activities as told by your health care provider. Ask your health care provider what activities are safe for you.  Take over-the-counter and prescription medicines only as told by your health care provider.  If you smoke, do not smoke without supervision.  Keep all follow-up visits as told by your health care provider. This is important. Contact a health care provider if:  You have nausea or vomiting that does not get better with medicine.  You cannot eat or drink without vomiting.  You have pain that does not get better with medicine.  You are unable to pass urine.  You develop a skin rash.  You have a fever.  You have redness around your IV site that gets worse. Get help right away if:  You have difficulty breathing.  You have chest pain.  You have blood in your urine or stool, or you vomit blood. Summary  After the procedure, it is common to have a sore throat or nausea. It is also common to feel tired.  Have a responsible adult stay with you for the first 24 hours after general anesthesia. It is important to have someone help care for you until you are awake and alert.  When you feel hungry, start by eating small amounts of foods that are soft and easy to digest (bland), such as toast. Gradually return to your regular diet.  Drink enough fluid to keep your urine pale yellow.  Return to your normal activities as told by your health care provider. Ask your health care provider what activities are safe for you. This information is not   intended to replace advice given to you by your health care provider. Make sure you discuss any questions you have with your health care provider. Document Revised: 08/14/2017 Document Reviewed: 03/27/2017 Elsevier Patient Education  2020 Elsevier Inc.  Cataract Surgery, Care After This sheet gives you information about how to care for yourself after your procedure. Your health care provider may also give you more specific  instructions. If you have problems or questions, contact your health care provider. What can I expect after the procedure? After the procedure, it is common to have:  Itching.  Discomfort.  Fluid discharge.  Sensitivity to light and to touch.  Bruising in or around the eye.  Mild blurred vision. Follow these instructions at home: Eye care   Do not touch or rub your eyes.  Protect your eyes as told by your health care provider. You may be told to wear a protective eye shield or sunglasses.  Do not put a contact lens into the affected eye or eyes until your health care provider approves.  Keep the area around your eye clean and dry: ? Avoid swimming. ? Do not allow water to hit you directly in the face while showering. ? Keep soap and shampoo out of your eyes.  Check your eye every day for signs of infection. Watch for: ? Redness, swelling, or pain. ? Fluid, blood, or pus. ? Warmth. ? A bad smell. ? Vision that is getting worse. ? Sensitivity that is getting worse. Activity  Do not drive for 24 hours if you were given a sedative during your procedure.  Avoid strenuous activities, such as playing contact sports, for as long as told by your health care provider.  Do not drive or use heavy machinery until your health care provider approves.  Do not bend or lift heavy objects. Bending increases pressure in the eye. You can walk, climb stairs, and do light household chores.  Ask your health care provider when you can return to work. If you work in a dusty environment, you may be advised to wear protective eyewear for a period of time. General instructions  Take or apply over-the-counter and prescription medicines only as told by your health care provider. This includes eye drops.  Keep all follow-up visits as told by your health care provider. This is important. Contact a health care provider if:  You have increased bruising around your eye.  You have pain that is  not helped with medicine.  You have a fever.  You have redness, swelling, or pain in your eye.  You have fluid, blood, or pus coming from your incision.  Your vision gets worse.  Your sensitivity to light gets worse. Get help right away if:  You have sudden loss of vision.  You see flashes of light or spots (floaters).  You have severe eye pain.  You develop nausea or vomiting. Summary  After your procedure, it is common to have itching, discomfort, bruising, fluid discharge, or sensitivity to light.  Follow instructions from your health care provider about caring for your eye after the procedure.  Do not rub your eye after the procedure. You may need to wear eye protection or sunglasses. Do not wear contact lenses. Keep the area around your eye clean and dry.  Avoid activities that require a lot of effort. These include playing sports and lifting heavy objects.  Contact a health care provider if you have increased bruising, pain that does not go away, or a fever. Get help right   away if you suddenly lose your vision, see flashes of light or spots, or have severe pain in the eye. This information is not intended to replace advice given to you by your health care provider. Make sure you discuss any questions you have with your health care provider. Document Revised: 06/07/2019 Document Reviewed: 02/08/2018 Elsevier Patient Education  2020 Elsevier Inc.  

## 2020-01-10 ENCOUNTER — Ambulatory Visit: Payer: No Typology Code available for payment source | Admitting: Anesthesiology

## 2020-01-10 ENCOUNTER — Encounter
Admission: RE | Disposition: A | Discharge status: Home / Self Care | Payer: Self-pay | Source: Home / Self Care | Attending: Ophthalmology

## 2020-01-10 ENCOUNTER — Ambulatory Visit
Admission: RE | Admit: 2020-01-10 | Discharge: 2020-01-10 | Disposition: A | Discharge status: Home / Self Care | Payer: No Typology Code available for payment source | Attending: Ophthalmology | Admitting: Ophthalmology

## 2020-01-10 ENCOUNTER — Encounter: Payer: Self-pay | Admitting: Ophthalmology

## 2020-01-10 ENCOUNTER — Other Ambulatory Visit: Payer: Self-pay

## 2020-01-10 DIAGNOSIS — D693 Immune thrombocytopenic purpura: Secondary | ICD-10-CM | POA: Diagnosis not present

## 2020-01-10 DIAGNOSIS — L719 Rosacea, unspecified: Secondary | ICD-10-CM | POA: Diagnosis not present

## 2020-01-10 DIAGNOSIS — D696 Thrombocytopenia, unspecified: Secondary | ICD-10-CM | POA: Diagnosis not present

## 2020-01-10 DIAGNOSIS — D649 Anemia, unspecified: Secondary | ICD-10-CM | POA: Diagnosis not present

## 2020-01-10 DIAGNOSIS — H2512 Age-related nuclear cataract, left eye: Secondary | ICD-10-CM | POA: Diagnosis present

## 2020-01-10 DIAGNOSIS — Z79899 Other long term (current) drug therapy: Secondary | ICD-10-CM | POA: Insufficient documentation

## 2020-01-10 HISTORY — PX: CATARACT EXTRACTION W/PHACO: SHX586

## 2020-01-10 SURGERY — PHACOEMULSIFICATION, CATARACT, WITH IOL INSERTION
Anesthesia: Monitor Anesthesia Care | Site: Eye | Laterality: Left

## 2020-01-10 MED ORDER — ONDANSETRON HCL 4 MG/2ML IJ SOLN: 4.0000 mg | Freq: Once | INTRAMUSCULAR | Status: DC | PRN

## 2020-01-10 MED ORDER — LACTATED RINGERS IV SOLN: 100.0000 mL/h | INTRAVENOUS | Status: DC

## 2020-01-10 MED ORDER — MIDAZOLAM HCL 2 MG/2ML IJ SOLN
INTRAMUSCULAR | Status: DC | PRN
  Administered 2020-01-10 (×2): 1 mg via INTRAVENOUS

## 2020-01-10 MED ORDER — BRIMONIDINE TARTRATE-TIMOLOL 0.2-0.5 % OP SOLN
OPHTHALMIC | Status: DC | PRN
  Administered 2020-01-10: 1 [drp] via OPHTHALMIC

## 2020-01-10 MED ORDER — LIDOCAINE HCL (PF) 2 % IJ SOLN
INTRAOCULAR | Status: DC | PRN
  Administered 2020-01-10: 1 mL

## 2020-01-10 MED ORDER — MOXIFLOXACIN HCL 0.5 % OP SOLN
OPHTHALMIC | Status: DC | PRN
  Administered 2020-01-10: 0.2 mL via OPHTHALMIC

## 2020-01-10 MED ORDER — ARMC OPHTHALMIC DILATING DROPS
1.0000 "application " | OPHTHALMIC | Status: DC | PRN
  Administered 2020-01-10 (×3): 1 via OPHTHALMIC

## 2020-01-10 MED ORDER — NA CHONDROIT SULF-NA HYALURON 40-17 MG/ML IO SOLN
INTRAOCULAR | Status: DC | PRN
  Administered 2020-01-10: 1 mL via INTRAOCULAR

## 2020-01-10 MED ORDER — TETRACAINE HCL 0.5 % OP SOLN
1.0000 [drp] | OPHTHALMIC | Status: DC | PRN
  Administered 2020-01-10 (×3): 1 [drp] via OPHTHALMIC

## 2020-01-10 MED ORDER — FENTANYL CITRATE (PF) 100 MCG/2ML IJ SOLN
INTRAMUSCULAR | Status: DC | PRN
  Administered 2020-01-10: 25 ug via INTRAVENOUS
  Administered 2020-01-10: 50 ug via INTRAVENOUS
  Administered 2020-01-10: 25 ug via INTRAVENOUS

## 2020-01-10 MED ORDER — ACETAMINOPHEN 10 MG/ML IV SOLN: 1000.0000 mg | Freq: Once | INTRAVENOUS | Status: DC | PRN

## 2020-01-10 MED ORDER — EPINEPHRINE PF 1 MG/ML IJ SOLN
INTRAOCULAR | Status: DC | PRN
  Administered 2020-01-10: 39 mL via OPHTHALMIC

## 2020-01-10 SURGICAL SUPPLY — 21 items
CANNULA ANT/CHMB 27G (MISCELLANEOUS) ×2 IMPLANT
CANNULA ANT/CHMB 27GA (MISCELLANEOUS) ×6 IMPLANT
GLOVE SURG LX 8.0 MICRO (GLOVE) ×2
GLOVE SURG LX STRL 8.0 MICRO (GLOVE) ×1 IMPLANT
GLOVE SURG TRIUMPH 8.0 PF LTX (GLOVE) ×3 IMPLANT
GOWN STRL REUS W/ TWL LRG LVL3 (GOWN DISPOSABLE) ×2 IMPLANT
GOWN STRL REUS W/TWL LRG LVL3 (GOWN DISPOSABLE) ×4
LENS IOL ACRSF VT TRC 415 24.0 IMPLANT
LENS IOL ACRYSOF VIVITY 24.0 ×2 IMPLANT
LENS IOL VIVITY 415 24.0 ×1 IMPLANT
MARKER SKIN DUAL TIP RULER LAB (MISCELLANEOUS) ×3 IMPLANT
NDL FILTER BLUNT 18X1 1/2 (NEEDLE) ×1 IMPLANT
NEEDLE FILTER BLUNT 18X 1/2SAF (NEEDLE) ×2
NEEDLE FILTER BLUNT 18X1 1/2 (NEEDLE) ×1 IMPLANT
PACK EYE AFTER SURG (MISCELLANEOUS) ×3 IMPLANT
PACK OPTHALMIC (MISCELLANEOUS) ×3 IMPLANT
PACK PORFILIO (MISCELLANEOUS) ×3 IMPLANT
SYR 3ML LL SCALE MARK (SYRINGE) ×3 IMPLANT
SYR TB 1ML LUER SLIP (SYRINGE) ×3 IMPLANT
WATER STERILE IRR 250ML POUR (IV SOLUTION) ×3 IMPLANT
WIPE NON LINTING 3.25X3.25 (MISCELLANEOUS) ×3 IMPLANT

## 2020-01-10 NOTE — Anesthesia Preprocedure Evaluation (Signed)
Anesthesia Evaluation  Patient identified by MRN, date of birth, ID band Patient awake    Reviewed: Allergy & Precautions, NPO status , Patient's Chart, lab work & pertinent test results, reviewed documented beta blocker date and time   History of Anesthesia Complications Negative for: history of anesthetic complications  Airway Mallampati: II  TM Distance: >3 FB Neck ROM: Full    Dental   Pulmonary    breath sounds clear to auscultation       Cardiovascular (-) angina(-) DOE  Rhythm:Regular Rate:Normal     Neuro/Psych    GI/Hepatic neg GERD  ,  Endo/Other   Endometriosis  Renal/GU      Musculoskeletal   Abdominal   Peds  Hematology  (+) Blood dyscrasia (Idiopathic thrombocytopenic purpura), anemia ,   Anesthesia Other Findings   Reproductive/Obstetrics                             Anesthesia Physical  Anesthesia Plan  ASA: II  Anesthesia Plan: MAC   Post-op Pain Management:    Induction: Intravenous  PONV Risk Score and Plan: 2 and TIVA, Midazolam and Treatment may vary due to age or medical condition  Airway Management Planned: Nasal Cannula  Additional Equipment:   Intra-op Plan:   Post-operative Plan:   Informed Consent: I have reviewed the patients History and Physical, chart, labs and discussed the procedure including the risks, benefits and alternatives for the proposed anesthesia with the patient or authorized representative who has indicated his/her understanding and acceptance.       Plan Discussed with: CRNA and Anesthesiologist  Anesthesia Plan Comments:         Anesthesia Quick Evaluation

## 2020-01-10 NOTE — H&P (Signed)
All labs reviewed. Abnormal studies sent to patients PCP when indicated.  Previous H&P reviewed, patient examined, there are NO CHANGES.  Kimberly Latouche Porfilio5/18/202111:20 AM

## 2020-01-10 NOTE — Op Note (Signed)
PREOPERATIVE DIAGNOSIS:  Nuclear sclerotic cataract of the left eye.   POSTOPERATIVE DIAGNOSIS:  Nuclear sclerotic cataract of the left eye.   OPERATIVE PROCEDURE: Procedure(s): CATARACT EXTRACTION PHACO AND INTRAOCULAR LENS PLACEMENT ([facility]) LEFT VIVITY TORIC LENS 6.19  00:32.8   SURGEON:  Birder Robson, MD.   ANESTHESIA: 1.      Managed anesthesia care. 2.     0.1ml os Shugarcaine was instilled following the paracentesis [email or web address]@   COMPLICATIONS:  None.   TECHNIQUE:   Stop and chop    DESCRIPTION OF PROCEDURE:  The patient was examined and consented in the preoperative holding area where the aforementioned topical anesthesia was applied to the left eye.  The patient was brought back to the Operating Room where he was sat upright on the gurney and given a target to fixate upon while the eye was marked at the 3:00 and 9:00 position.  The patient was then reclined on the operating table.  The eye was prepped and draped in the usual sterile ophthalmic fashion and a lid speculum was placed. A paracentesis was created with the side port blade and the anterior chamber was filled with viscoelastic. A near clear corneal incision was performed with the steel keratome. A continuous curvilinear capsulorrhexis was performed with a cystotome followed by the capsulorrhexis forceps. Hydrodissection and hydrodelineation were carried out with BSS on a blunt cannula. The lens was removed in a stop and chop technique and the remaining cortical material was removed with the irrigation-aspiration handpiece. The eye was inflated with viscoelastic and the DFT 415lens was placed in the eye and rotated to within a few degrees of the predetermined orientation.  The remaining viscoelastic was removed from the eye.  The Sinskey hook was used to rotate the toric lens into its final resting place at 143 degrees.  0.1 ml of Vigamox was placed in the anterior chamber. The eye was inflated to a physiologic pressure and  found to be watertight.  The eye was dressed with Vigamox. The patient was given protective glasses to wear throughout the day and a shield with which to sleep tonight. The patient was also given drops with which to begin a drop regimen today and will follow-up with me in one day. Implant Name Type Inv. Item Serial No. Manufacturer Lot No. LRB No. Used Action  LENS ACRYSOF VIVITY TORIC 24.0 - [id number]  LENS ACRYSOF VIVITY TORIC 24.0 [id number] ALCON  Left 1 Implanted   Procedure(s): CATARACT EXTRACTION PHACO AND INTRAOCULAR LENS PLACEMENT ([facility]) LEFT VIVITY TORIC LENS 6.19  00:32.8 (Left)  Electronically signed: Birder Robson 5/18/202111:55 AM

## 2020-01-10 NOTE — Transfer of Care (Signed)
Immediate Anesthesia Transfer of Care Note  Patient: Kimberly Figueroa  Procedure(s) Performed: CATARACT EXTRACTION PHACO AND INTRAOCULAR LENS PLACEMENT ([facility]) LEFT VIVITY TORIC LENS 6.19  00:32.8 (Left Eye)  Patient Location: PACU  Anesthesia Type: MAC  Level of Consciousness: awake, alert  and patient cooperative  Airway and Oxygen Therapy: Patient Spontanous Breathing   Post-op Assessment: Post-op Vital signs reviewed, Patient's Cardiovascular Status Stable, Respiratory Function Stable, Patent Airway and No signs of Nausea or vomiting  Post-op Vital Signs: Reviewed and stable  Complications: No apparent anesthesia complications

## 2020-01-10 NOTE — Anesthesia Postprocedure Evaluation (Signed)
Anesthesia Post Note  Patient: Kimberly Figueroa  Procedure(s) Performed: CATARACT EXTRACTION PHACO AND INTRAOCULAR LENS PLACEMENT ([facility]) LEFT VIVITY TORIC LENS 6.19  00:32.8 (Left Eye)     Patient location during evaluation: PACU Anesthesia Type: MAC Level of consciousness: awake and alert Pain management: pain level controlled Vital Signs Assessment: post-procedure vital signs reviewed and stable Respiratory status: spontaneous breathing, nonlabored ventilation, respiratory function stable and patient connected to nasal cannula oxygen Cardiovascular status: stable and blood pressure returned to baseline Postop Assessment: no apparent nausea or vomiting Anesthetic complications: no    Kwabena Strutz A  Horace Wishon

## 2020-01-10 NOTE — Anesthesia Procedure Notes (Signed)
Procedure Name: MAC Date/Time: 01/10/2020 11:38 AM Performed by: Vanetta Shawl, CRNA Pre-anesthesia Checklist: Patient identified, Emergency Drugs available, Suction available, Timeout performed and Patient being monitored Patient Re-evaluated:Patient Re-evaluated prior to induction Oxygen Delivery Method: Nasal cannula Placement Confirmation: positive ETCO2

## 2020-01-11 ENCOUNTER — Encounter: Payer: Self-pay | Admitting: *Deleted

## 2020-01-30 ENCOUNTER — Other Ambulatory Visit: Payer: Self-pay

## 2020-01-30 ENCOUNTER — Ambulatory Visit (INDEPENDENT_AMBULATORY_CARE_PROVIDER_SITE_OTHER): Payer: No Typology Code available for payment source

## 2020-01-30 ENCOUNTER — Encounter: Payer: Self-pay | Admitting: Internal Medicine

## 2020-01-30 ENCOUNTER — Ambulatory Visit (INDEPENDENT_AMBULATORY_CARE_PROVIDER_SITE_OTHER): Payer: No Typology Code available for payment source | Admitting: Internal Medicine

## 2020-01-30 VITALS — BP 118/66 | HR 81 | Temp 98.0°F | Resp 16 | Ht 67.0 in | Wt 155.0 lb

## 2020-01-30 DIAGNOSIS — Z1211 Encounter for screening for malignant neoplasm of colon: Secondary | ICD-10-CM | POA: Insufficient documentation

## 2020-01-30 DIAGNOSIS — D696 Thrombocytopenia, unspecified: Secondary | ICD-10-CM | POA: Diagnosis not present

## 2020-01-30 DIAGNOSIS — F439 Reaction to severe stress, unspecified: Secondary | ICD-10-CM

## 2020-01-30 DIAGNOSIS — R739 Hyperglycemia, unspecified: Secondary | ICD-10-CM

## 2020-01-30 DIAGNOSIS — Z0000 Encounter for general adult medical examination without abnormal findings: Secondary | ICD-10-CM | POA: Diagnosis not present

## 2020-01-30 DIAGNOSIS — D638 Anemia in other chronic diseases classified elsewhere: Secondary | ICD-10-CM

## 2020-01-30 DIAGNOSIS — M545 Low back pain, unspecified: Secondary | ICD-10-CM

## 2020-01-30 DIAGNOSIS — Z13220 Encounter for screening for lipoid disorders: Secondary | ICD-10-CM

## 2020-01-30 DIAGNOSIS — R400 Somnolence: Secondary | ICD-10-CM

## 2020-01-30 NOTE — Progress Notes (Signed)
Patient ID: Kimberly Figueroa, female   DOB: August 04, 1956, 64 y.o.   MRN: [id number]   Subjective:    Patient ID: Kimberly Figueroa, female    DOB: 08-08-1956, 64 y.o.   MRN: [id number]  HPI This visit occurred during the SARS-CoV-2 public health emergency.  Safety protocols were in place, including screening questions prior to the visit, additional usage of staff PPE, and extensive cleaning of exam room while observing appropriate contact time as indicated for disinfecting solutions.  Patient here for her physical exam.  She reports she is doing relatively well.  Having increased pain in her back - back, hip and leg pain.  Hurts to strain to have a bowel movement.  Has seen Dr Maryjean Ka.  Tries to stay active.  No chest pain or sob reported.  No abdominal pain or bowel change reported.  Diagnosed with rosacea.  On doxycyline. Handling stress.     Past Medical History:  Diagnosis Date  . Endometriosis   . History of frequent urinary tract infections    s/p bladder dilatation (11/05)  . Hypersomnia, recurrent 06/28/2014  . ITP (idiopathic thrombocytopenic purpura)   . Pneumonia   . Rosacea    Past Surgical History:  Procedure Laterality Date  . bladder dilatation  11/05  . CATARACT EXTRACTION W/PHACO Right 12/13/2019   Procedure: CATARACT EXTRACTION PHACO AND INTRAOCULAR LENS PLACEMENT ([facility]) RIGHT VIVITY TORIC LENS 4.35 00:30.2;  Surgeon: Birder Robson, MD;  Location: [facility];  Service: Ophthalmology;  Laterality: Right;  . CATARACT EXTRACTION W/PHACO Left 01/10/2020   Procedure: CATARACT EXTRACTION PHACO AND INTRAOCULAR LENS PLACEMENT ([facility]) LEFT VIVITY TORIC LENS 6.19  00:32.8;  Surgeon: Birder Robson, MD;  Location: [facility];  Service: Ophthalmology;  Laterality: Left;  . CESAREAN SECTION  1993  . COLONOSCOPY    . laproscopic surgery  10/06   found to have endometriosis  . SHOULDER SURGERY Left 2008  . TONSILLECTOMY  1961  . VAGINAL DELIVERY  1987  .  VAGINAL HYSTERECTOMY  02/07   Family History  Problem Relation Age of Onset  . Alcohol abuse Mother   . Varicose Veins Mother   . Diabetes Father   . Hearing loss Father   . Heart disease Father   . Crohn's disease Father   . Crohn's disease Paternal Grandfather   . Breast cancer Neg Hx   . Colon cancer Neg Hx    Social History   Socioeconomic History  . Marital status: Married    Spouse name: Gwyndolyn Saxon  . Number of children: 2  . Years of education: college  . Highest education level: Not on file  Occupational History  . Occupation: Advertising account planner. Director    Employer: [employer]  Tobacco Use  . 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 Smokeless tobacco: Never Used  Vaping Use  . Vaping Use: Never used  Substance and Sexual Activity  . Alcohol use: Yes    Alcohol/week: 2.0 standard drinks    Types: 2 Glasses of wine per week  . Drug use: No  . Sexual activity: Not on file  Other Topics Concern  . Not on file  Social History Narrative   Patient consumes 6-8 cups tea daily, is right handed   Social Determinants of Health   Financial Resource Strain:   . Difficulty of Paying Living Expenses:   Food Insecurity:   . Worried About Charity fundraiser in the Last Year:   . [place] in the Last Year:  Transportation Needs:   . Film/video editor (Medical):   Marland Kitchen Lack of Transportation (Non-Medical):   Physical Activity:   . Days of Exercise per Week:   . Minutes of Exercise per Session:   Stress:   . Feeling of Stress :   Social Connections:   . Frequency of Communication with Friends and Family:   . Frequency of Social Gatherings with Friends and Family:   . Attends Religious Services:   . Active Member of Clubs or Organizations:   . Attends Archivist Meetings:   Marland Kitchen Marital Status:     Outpatient Encounter Medications as of 01/30/2020  Medication Sig  . citalopram (CELEXA) 40 MG tablet TAKE 1 TABLET (40 MG TOTAL) BY MOUTH DAILY.  . cyclobenzaprine  (FLEXERIL) 5 MG tablet TAKE 1 TABLET BY MOUTH AT BEDTIME AS NEEDED FOR MUSCLE SPASM. CAN CAUSE DROWSINESS.  Marland Kitchen doxycycline (PERIOSTAT) 20 MG tablet Take 1 tablet by mouth daily.  . hydrocortisone (ANUSOL-HC) 25 MG suppository Place 1 suppository (25 mg total) rectally 2 (two) times daily. (Patient not taking: Reported on 12/06/2019)  . modafinil (PROVIGIL) 100 MG tablet Take 1 tablet (100 mg total) by mouth daily.  . Multiple Vitamins-Minerals (MULTIVITAMIN GUMMIES ADULTS PO) Take by mouth. VitaCraves  . Progesterone Micronized 10 % CREA Place onto the skin.  Marland Kitchen UNABLE TO FIND 2 (two) times daily as needed. Skin Medicinals(cream):  Azelaic Acid 15%, Ivermectin 1%, Metronidazole 1%  . valACYclovir (VALTREX) 1000 MG tablet   . zolpidem (AMBIEN) 10 MG tablet TAKE 1/2 TO 1 TABLET BY MOUTH AT BEDTIME AS NEEDED FOR SLEEP.  . [DISCONTINUED] fluticasone (FLONASE) 50 MCG/ACT nasal spray   . [DISCONTINUED] SOOLANTRA 1 % CREA    No facility-administered encounter medications on file as of 01/30/2020.   Review of Systems  Constitutional: Negative for appetite change and unexpected weight change.  HENT: Negative for congestion and sinus pressure.   Eyes: Negative for pain and visual disturbance.  Respiratory: Negative for cough, chest tightness and shortness of breath.   Cardiovascular: Negative for chest pain, palpitations and leg swelling.  Gastrointestinal: Negative for abdominal pain, diarrhea, nausea and vomiting.  Genitourinary: Negative for difficulty urinating and dysuria.  Musculoskeletal: Positive for back pain. Negative for joint swelling and myalgias.  Skin: Negative for color change and rash.  Neurological: Negative for dizziness, light-headedness and headaches.  Hematological: Negative for adenopathy. Does not bruise/bleed easily.  Psychiatric/Behavioral: Negative for agitation and dysphoric mood.       Objective:    Physical Exam Vitals reviewed.  Constitutional:      General: She is  not in acute distress.    Appearance: Normal appearance. She is well-developed.  HENT:     Head: Normocephalic and atraumatic.     Right Ear: External ear normal.     Left Ear: External ear normal.  Eyes:     General: No scleral icterus.       Right eye: No discharge.        Left eye: No discharge.     Conjunctiva/sclera: Conjunctivae normal.  Neck:     Thyroid: No thyromegaly.  Cardiovascular:     Rate and Rhythm: Normal rate and regular rhythm.  Pulmonary:     Effort: No tachypnea, accessory muscle usage or respiratory distress.     Breath sounds: Normal breath sounds. No decreased breath sounds or wheezing.  Chest:     Breasts:        Right: No inverted nipple, mass, nipple discharge  or tenderness (no axillary adenopathy).        Left: No inverted nipple, mass, nipple discharge or tenderness (no axilarry adenopathy).  Abdominal:     General: Bowel sounds are normal.     Palpations: Abdomen is soft.     Tenderness: There is no abdominal tenderness.  Musculoskeletal:        General: No swelling or tenderness.     Cervical back: Neck supple. No tenderness.  Lymphadenopathy:     Cervical: No cervical adenopathy.  Skin:    Findings: No erythema or rash.  Neurological:     Mental Status: She is alert and oriented to person, place, and time.  Psychiatric:        Mood and Affect: Mood normal.        Behavior: Behavior normal.     BP 118/66   Pulse 81   Temp 98 F (36.7 C)   Resp 16   Ht '5\' 7"'$  (1.702 m)   Wt 155 lb (70.3 kg)   SpO2 99%   BMI 24.28 kg/m  Wt Readings from Last 3 Encounters:  01/30/20 155 lb (70.3 kg)  01/10/20 153 lb (69.4 kg)  12/13/19 153 lb 3.2 oz (69.5 kg)     Lab Results  Component Value Date   WBC 4.1 11/24/2019   HGB 12.3 11/24/2019   HCT 37.1 11/24/2019   PLT 152.0 11/24/2019   GLUCOSE 92 10/25/2019   CHOL 162 10/25/2019   TRIG 55.0 10/25/2019   HDL 65.80 10/25/2019   LDLCALC 85 10/25/2019   ALT 13 10/25/2019   AST 19 10/25/2019     NA 141 10/25/2019   K 3.7 10/25/2019   CL 105 10/25/2019   CREATININE 0.72 10/25/2019   BUN 11 10/25/2019   CO2 31 10/25/2019   TSH 1.67 01/19/2019   HGBA1C 5.8 10/25/2019       Assessment & Plan:   Problem List Items Addressed This Visit    Anemia    Follow cbc.       Back pain - Primary    Previously saw Dr Maryjean Ka.  Persistent pain as outlined.  Check L-S spine xray.        Relevant Orders   DG Lumbar Spine 2-3 Views (Completed)   Colon cancer screening    Colonoscopy 2015.  Recommended f/u in 10 years.  IFOB given.       Relevant Orders   Fecal occult blood, imunochemical   Daytime somnolence    Followed by neurology.       Health care maintenance    Physical 01/30/20.  S/p hysterectomy.  Mammogram 11/23/19 - [facility].  Will request a copy.  Colonoscopy 2015.  Recommended f/u in 10 years.        Hyperglycemia    Low carb diet and exercise.  Follow met b and a1c.       Relevant Orders   Hemoglobin A1c   TSH   Basic metabolic panel   Stress    Overall appears to be handling stress well.  Follow.        Thrombocytopenia ([place])    Has known history of ITP.  Has been worked up by hematology.  Follow cbc.       Relevant Orders   CBC with Differential/Platelet   Hepatic function panel    Other Visit Diagnoses    Screening cholesterol level       Relevant Orders   Lipid panel       Einar Pheasant,  MD 

## 2020-01-30 NOTE — Assessment & Plan Note (Signed)
Physical 01/30/20.  S/p hysterectomy.  Mammogram 11/23/19 - [facility].  Will request a copy.  Colonoscopy 2015.  Recommended f/u in 10 years.

## 2020-02-01 ENCOUNTER — Telehealth: Payer: Self-pay | Admitting: Internal Medicine

## 2020-02-01 NOTE — Telephone Encounter (Signed)
I thing this is one they fax [place] the rx.  Please confirm.

## 2020-02-01 NOTE — Telephone Encounter (Signed)
Pt called in and stated that her pharmacy said her medication was declined by doctorProgesterone Micronized 10 % CREA

## 2020-02-01 NOTE — Telephone Encounter (Signed)
Phoned in refill to compounder at [facility].

## 2020-02-01 NOTE — Telephone Encounter (Signed)
Thhis medication is a historical medication. Please advise.

## 2020-02-05 ENCOUNTER — Encounter: Payer: Self-pay | Admitting: Internal Medicine

## 2020-02-05 NOTE — Assessment & Plan Note (Signed)
Previously saw Dr Maryjean Ka.  Persistent pain as outlined.  Check L-S spine xray.

## 2020-02-05 NOTE — Assessment & Plan Note (Signed)
Has known history of ITP.  Has been worked up by hematology.  Follow cbc.  

## 2020-02-05 NOTE — Assessment & Plan Note (Signed)
Colonoscopy 2015.  Recommended f/u in 10 years.  IFOB given.

## 2020-02-05 NOTE — Assessment & Plan Note (Signed)
Follow cbc.  

## 2020-02-05 NOTE — Assessment & Plan Note (Signed)
Overall appears to be handling stress well.  Follow.  

## 2020-02-05 NOTE — Assessment & Plan Note (Signed)
Low carb diet and exercise.  Follow met b and a1c.  

## 2020-02-05 NOTE — Assessment & Plan Note (Signed)
Followed by neurology.   

## 2020-02-13 ENCOUNTER — Other Ambulatory Visit: Payer: Self-pay | Admitting: Adult Health

## 2020-02-28 ENCOUNTER — Other Ambulatory Visit (INDEPENDENT_AMBULATORY_CARE_PROVIDER_SITE_OTHER): Payer: No Typology Code available for payment source

## 2020-02-28 DIAGNOSIS — Z1211 Encounter for screening for malignant neoplasm of colon: Secondary | ICD-10-CM | POA: Diagnosis not present

## 2020-02-28 LAB — FECAL OCCULT BLOOD, IMMUNOCHEMICAL: Fecal Occult Bld: NEGATIVE

## 2020-03-06 ENCOUNTER — Telehealth: Payer: No Typology Code available for payment source | Admitting: Physician Assistant

## 2020-03-06 DIAGNOSIS — R399 Unspecified symptoms and signs involving the genitourinary system: Secondary | ICD-10-CM | POA: Diagnosis not present

## 2020-03-06 MED ORDER — CEPHALEXIN 500 MG PO CAPS: 500.0000 mg | ORAL_CAPSULE | Freq: Two times a day (BID) | ORAL | 0 refills | Status: AC

## 2020-03-06 NOTE — Progress Notes (Signed)

## 2020-03-20 ENCOUNTER — Other Ambulatory Visit: Payer: Self-pay | Admitting: Internal Medicine

## 2020-04-12 ENCOUNTER — Encounter: Payer: Self-pay | Admitting: Internal Medicine

## 2020-04-12 DIAGNOSIS — B351 Tinea unguium: Secondary | ICD-10-CM

## 2020-04-12 NOTE — Telephone Encounter (Signed)
Order placed for referral to podiatry.   

## 2020-05-01 ENCOUNTER — Encounter: Payer: Self-pay | Admitting: Dermatology

## 2020-05-03 ENCOUNTER — Other Ambulatory Visit: Payer: No Typology Code available for payment source

## 2020-05-03 ENCOUNTER — Encounter: Payer: Self-pay | Admitting: Podiatry

## 2020-05-03 ENCOUNTER — Ambulatory Visit (INDEPENDENT_AMBULATORY_CARE_PROVIDER_SITE_OTHER): Payer: No Typology Code available for payment source | Admitting: Podiatry

## 2020-05-03 ENCOUNTER — Other Ambulatory Visit: Payer: Self-pay

## 2020-05-03 DIAGNOSIS — B351 Tinea unguium: Secondary | ICD-10-CM

## 2020-05-03 DIAGNOSIS — L600 Ingrowing nail: Secondary | ICD-10-CM | POA: Diagnosis not present

## 2020-05-03 NOTE — Progress Notes (Signed)
Subjective:   Patient ID: Kimberly Figueroa, female   DOB: 64 y.o.   MRN: [id number]   HPI Patient presents stating the right hallux nail has lifted and its been painful and I did traumatize it when I was on a trip.  States is been this way for the last few months   Review of Systems  All other systems reviewed and are negative.       Objective:  Physical Exam Vitals and nursing note reviewed.  Constitutional:      Appearance: She is well-developed.  Pulmonary:     Effort: Pulmonary effort is normal.  Musculoskeletal:        General: Normal range of motion.  Skin:    General: Skin is warm.  Neurological:     Mental Status: She is alert.     Neurovascular status intact muscle strength adequate range of motion within normal limits with patient found to have a lifted right hallux nail that is loose with no drainage underneath it or odor with mild discomfort associated with it.  Patient has good digit perfusion well oriented x3     Assessment:  Acute damage to the right hallux nail with lifting with probable loss of nail`     Plan:  H&P reviewed condition.  I do think the nail needs to be removed and I recommended doing this allowing it to regrow even though I did explain it may not grow back normally and may eventually require a permanent procedure.  Today infiltrated the right hallux 60 mg like Marcaine mixture sterile prep done and using sterile instrumentation I remove the nail I flushed out the bed I applied sterile dressing gave instructions for soaks and reappoint as needed signed this

## 2020-05-03 NOTE — Patient Instructions (Signed)

## 2020-05-04 ENCOUNTER — Other Ambulatory Visit (INDEPENDENT_AMBULATORY_CARE_PROVIDER_SITE_OTHER): Payer: No Typology Code available for payment source

## 2020-05-04 ENCOUNTER — Other Ambulatory Visit: Payer: Self-pay

## 2020-05-04 ENCOUNTER — Encounter: Payer: Self-pay | Admitting: Internal Medicine

## 2020-05-04 DIAGNOSIS — R739 Hyperglycemia, unspecified: Secondary | ICD-10-CM

## 2020-05-04 DIAGNOSIS — D696 Thrombocytopenia, unspecified: Secondary | ICD-10-CM

## 2020-05-04 DIAGNOSIS — Z13220 Encounter for screening for lipoid disorders: Secondary | ICD-10-CM | POA: Diagnosis not present

## 2020-05-04 LAB — BASIC METABOLIC PANEL
BUN: 12 mg/dL (ref 6–23)
CO2: 32 mEq/L (ref 19–32)
Calcium: 8.9 mg/dL (ref 8.4–10.5)
Chloride: 105 mEq/L (ref 96–112)
Creatinine, Ser: 0.68 mg/dL (ref 0.40–1.20)
GFR: 87.62 mL/min (ref >60.00)
Glucose, Bld: 89 mg/dL (ref 70–99)
Potassium: 3.8 mEq/L (ref 3.5–5.1)
Sodium: 142 mEq/L (ref 135–145)

## 2020-05-04 LAB — CBC WITH DIFFERENTIAL/PLATELET
Basophils Absolute: 0 10*3/uL (ref 0.0–0.1)
Basophils Relative: 0.9 % (ref 0.0–3.0)
Eosinophils Absolute: 0.1 10*3/uL (ref 0.0–0.7)
Eosinophils Relative: 3.1 % (ref 0.0–5.0)
HCT: 38.2 % (ref 36.0–46.0)
Hemoglobin: 12.5 g/dL (ref 12.0–15.0)
Lymphocytes Relative: 30.4 % (ref 12.0–46.0)
Lymphs Abs: 1.2 10*3/uL (ref 0.7–4.0)
MCHC: 32.7 g/dL (ref 30.0–36.0)
MCV: 92.6 fl (ref 78.0–100.0)
Monocytes Absolute: 0.3 10*3/uL (ref 0.1–1.0)
Monocytes Relative: 7.5 % (ref 3.0–12.0)
Neutro Abs: 2.3 10*3/uL (ref 1.4–7.7)
Neutrophils Relative %: 58.1 % (ref 43.0–77.0)
Platelets: 171 10*3/uL (ref 150.0–400.0)
RBC: 4.12 Mil/uL (ref 3.87–5.11)
RDW: 14.3 % (ref 11.5–15.5)
WBC: 4 10*3/uL (ref 4.0–10.5)

## 2020-05-04 LAB — HEPATIC FUNCTION PANEL
ALT: 22 U/L (ref 0–35)
AST: 21 U/L (ref 0–37)
Albumin: 4.2 g/dL (ref 3.5–5.2)
Alkaline Phosphatase: 82 U/L (ref 39–117)
Bilirubin, Direct: 0.1 mg/dL (ref 0.0–0.3)
Total Bilirubin: 0.4 mg/dL (ref 0.2–1.2)
Total Protein: 6.3 g/dL (ref 6.0–8.3)

## 2020-05-04 LAB — LIPID PANEL
Cholesterol: 168 mg/dL (ref 0–200)
HDL: 72.2 mg/dL (ref >39.00)
LDL Cholesterol: 86 mg/dL (ref 0–99)
NonHDL: 95.68
Total CHOL/HDL Ratio: 2
Triglycerides: 46 mg/dL (ref 0.0–149.0)
VLDL: 9.2 mg/dL (ref 0.0–40.0)

## 2020-05-04 LAB — TSH: TSH: 3.07 u[IU]/mL (ref 0.35–4.50)

## 2020-05-04 LAB — HEMOGLOBIN A1C: Hgb A1c MFr Bld: 6 % (ref 4.6–6.5)

## 2020-05-15 ENCOUNTER — Encounter: Payer: Self-pay | Admitting: Podiatry

## 2020-05-24 ENCOUNTER — Ambulatory Visit: Payer: Self-pay | Admitting: Dermatology

## 2020-05-31 ENCOUNTER — Encounter: Payer: Self-pay | Admitting: Internal Medicine

## 2020-05-31 ENCOUNTER — Other Ambulatory Visit: Payer: Self-pay

## 2020-06-01 NOTE — Telephone Encounter (Signed)
She is requesting a refill on progesterone cream.  This has previously been called in to [facility].  Is a compound medication and we have been either calling in or they have been sending a faxed rx.  She is wanting sent to a different pharmacy.  Will need to get a copy of actual rx - or clarification from pt on rx.  See me if questions.

## 2020-06-11 ENCOUNTER — Other Ambulatory Visit: Payer: Self-pay

## 2020-06-11 MED ORDER — DOXYCYCLINE HYCLATE 20 MG PO TABS: 20.0000 mg | ORAL_TABLET | Freq: Two times a day (BID) | ORAL | 0 refills | Status: DC

## 2020-06-11 NOTE — Telephone Encounter (Signed)
Pt called to follow up on rx

## 2020-06-11 NOTE — Progress Notes (Signed)
rx refill

## 2020-06-11 NOTE — Telephone Encounter (Signed)
Spoke with pt and pharmacy. This rx was filled today because she had one refill left at [facility]. She was wanting 90 days. Advised that pharmacy is going to send me a refill request and we will fix rx next fill. Pt was agreeable.

## 2020-06-14 NOTE — Telephone Encounter (Signed)
This has already been taken care of

## 2020-06-15 ENCOUNTER — Other Ambulatory Visit: Payer: Self-pay | Admitting: Neurosurgery

## 2020-06-27 ENCOUNTER — Other Ambulatory Visit: Payer: Self-pay | Admitting: Dermatology

## 2020-06-27 ENCOUNTER — Encounter: Payer: Self-pay | Admitting: Dermatology

## 2020-06-27 ENCOUNTER — Ambulatory Visit (INDEPENDENT_AMBULATORY_CARE_PROVIDER_SITE_OTHER): Payer: No Typology Code available for payment source | Admitting: Dermatology

## 2020-06-27 ENCOUNTER — Other Ambulatory Visit: Payer: Self-pay

## 2020-06-27 DIAGNOSIS — D485 Neoplasm of uncertain behavior of skin: Secondary | ICD-10-CM

## 2020-06-27 DIAGNOSIS — L719 Rosacea, unspecified: Secondary | ICD-10-CM | POA: Diagnosis not present

## 2020-06-27 DIAGNOSIS — D489 Neoplasm of uncertain behavior, unspecified: Secondary | ICD-10-CM

## 2020-06-27 DIAGNOSIS — L700 Acne vulgaris: Secondary | ICD-10-CM

## 2020-06-27 MED ORDER — TRETINOIN 0.025 % EX CREA: TOPICAL_CREAM | Freq: Every evening | CUTANEOUS | 0 refills | Status: DC

## 2020-06-27 MED ORDER — DOXYCYCLINE 40 MG PO CPDR: 40.0000 mg | DELAYED_RELEASE_CAPSULE | ORAL | 1 refills | Status: DC

## 2020-06-27 MED ORDER — MUPIROCIN 2 % EX OINT: TOPICAL_OINTMENT | CUTANEOUS | 0 refills | Status: DC

## 2020-06-27 NOTE — Progress Notes (Signed)
Follow-Up Visit   Subjective  Kimberly Figueroa is a 64 y.o. female who presents for the following: Rosacea (6 mo f/u, treating with Doxycycline 20 mg BID and Rosacea cream from [facility]. Still having some flares. ).  Also has a raised rough area at the back of her neck present at least 3 months.  The following portions of the chart were reviewed this encounter and updated as appropriate: Tobacco  Allergies  Meds  Problems  Med Hx  Surg Hx  Fam Hx      Review of Systems: No other skin or systemic complaints except as noted in HPI or Assessment and Plan.   Objective  Well appearing patient in no apparent distress; mood and affect are within normal limits.  A focused examination was performed including face, neck, chest, scalp. Relevant physical exam findings are noted in the Assessment and Plan.  Objective  Head - Anterior (Face): Mid-face erythema and scattered inflammatory papules  Objective  left posterior neck: 0.6c m crusted pink papule Rule out ISK vs prurigo nodule vs other     Objective  Head - Anterior (Face): Few inflammatory papules lateral cheeks, trace open comedones  Assessment & Plan  Rosacea Head - Anterior (Face)  Chronic, improved but not at goal Ocular symptoms (dryness) improved on doxycycline but skin still flaring  D/C doxycycline 20 mg BID. Start oracea 40 mg once a day (Tried and failed doxycycline 20 mg bid, metronidazole, ivermectin and azelaic acid)  Continue cream from [facility].  Azelaic Acid: 15% Ivermectin: 1% Metronidazole: 1% BID   Given sample of Rhofade to try. May consider sending in Rhofade if it works well for her or possibly switching to [facility] cream with oxymetolazone.    Ordered Medications: doxycycline (ORACEA) 40 MG capsule  Neoplasm of uncertain behavior left posterior neck  mupirocin ointment (BACTROBAN) 2 %  Skin / nail biopsy Type of biopsy: tangential   Informed consent:  discussed and consent obtained   Timeout: patient name, date of birth, surgical site, and procedure verified   Procedure prep:  Patient was prepped and draped in usual sterile fashion Prep type:  Isopropyl alcohol Anesthesia: the lesion was anesthetized in a standard fashion   Anesthetic:  1% lidocaine w/ epinephrine 1-100,000 buffered w/ 8.4% NaHCO3 Instrument used: flexible razor blade   Hemostasis achieved with: pressure, aluminum chloride and electrodesiccation   Outcome: patient tolerated procedure well   Post-procedure details: sterile dressing applied and wound care instructions given   Dressing type: bandage and petrolatum    Specimen 1 - Surgical pathology Differential Diagnosis: Rule out ISK vs prurigo nodule vs other Check Margins: No 0.6c m crusted pink papule  Acne vulgaris Head - Anterior (Face)  Start tretinoin 0.025% cream. Start every other night.   Topical retinoid medications like tretinoin/Retin-A, adapalene/Differin, tazarotene/Fabior, and Epiduo/Epiduo Forte can cause dryness and irritation when first started. Only apply a pea-sized amount to the entire affected area. Avoid applying it around the eyes, edges of mouth and creases at the nose. If you experience irritation, use a good moisturizer first and/or apply the medicine less often. If you are doing well with the medicine, you can increase how often you use it until you are applying every night. Be careful with sun protection while using this medication as it can make you sensitive to the sun. This medicine should not be used by pregnant women.    Ordered Medications: tretinoin (RETIN-A) 0.025 % cream  Return in about 6  months (around 12/25/2020).  I, Harriett Sine, CMA, am acting as scribe for Forest Gleason, MD.  Documentation: I have reviewed the above documentation for accuracy and completeness, and I agree with the above.  Forest Gleason, MD

## 2020-06-27 NOTE — Patient Instructions (Addendum)
EternalVitamin.dk. Consider hyaluronic acid moisturizer.   Recommend daily broad spectrum sunscreen SPF 30+ to sun-exposed areas, reapply every 2 hours as needed. Call for new or changing lesions.  Homemade redness cream - One bottle of Afrin mixed in a bottle of Cerave PM moisturizer. Apply to face in the morning before your sunscreen or makeup.   Topical retinoid medications like tretinoin/Retin-A, adapalene/Differin, tazarotene/Fabior, and Epiduo/Epiduo Forte can cause dryness and irritation when first started. Only apply a pea-sized amount to the entire affected area. Avoid applying it around the eyes, edges of mouth and creases at the nose. If you experience irritation, use a good moisturizer first and/or apply the medicine less often. If you are doing well with the medicine, you can increase how often you use it until you are applying every night. Be careful with sun protection while using this medication as it can make you sensitive to the sun. This medicine should not be used by pregnant women.   Doxycycline should be taken with food to prevent nausea. Do not lay down for 30 minutes after taking. Be cautious with sun exposure and use good sun protection while on this medication. Pregnant women should not take this medication.    Wound Care Instructions  1. Cleanse wound gently with soap and water once a day then pat dry with clean gauze. Apply a thing coat of Mupirocin (Bactroban) over the wound (unless you have an allergy to this). We recommend that you use a new, sterile tube of Mupirocin. Do not pick or remove scabs. Do not remove the yellow or white "healing tissue" from the base of the wound.  2. Cover the wound with fresh, clean, nonstick gauze and secure with paper tape. You may use Band-Aids in place of gauze and tape if the would is small enough, but would recommend trimming much of the tape off as there is often too much. Sometimes Band-Aids can irritate the skin.  3. You should call  the office for your biopsy report after 1 week if you have not already been contacted.  4. If you experience any problems, such as abnormal amounts of bleeding, swelling, significant bruising, significant pain, or evidence of infection, please call the office immediately.  5. FOR ADULT SURGERY PATIENTS: If you need something for pain relief you may take 1 extra strength Tylenol (acetaminophen) AND 2 Ibuprofen (200mg  each) together every 4 hours as needed for pain. (do not take these if you are allergic to them or if you have a reason you should not take them.) Typically, you may only need pain medication for 1 to 3 days.

## 2020-07-02 NOTE — Progress Notes (Signed)
  [facility] [address] [place]. [place] [address] [phone]     Virtual Visit via Telephone Note  I connected with Kimberly Figueroa on 07/03/20 at  3:00 PM EST by telephone located remotely at [facility] and verified that I am speaking with the correct person using two identifiers who reports being located at home   Visit scheduled by RN. She discussed the limitations, risks, security and privacy concerns of performing an evaluation and management service by telephone and the availability of in person appointments. I also discussed with the patient that there may be a patient responsible charge related to this service. The patient expressed understanding and agreed to proceed. See telephone note for consent and additional scheduling information.    History of Present Illness:  Kimberly Figueroa is a 64 y.o. female who has been followed in this office for insomnia and daytime sleepiness.  She was initially scheduled for MyChart video visit however she was unable to hear me so we transferred to a telephone visit.  She states that over the last month her stress levels have increased.  Therefore she has been having a difficult time sleeping and has been using 1 full tablet of Ambien.  She also reports that she has been having low back pain and is seeing pain management.  She states this also disrupts her sleep.  She states that she has Provigil but probably use it 5 to 10 days out of a month.  She typically only uses it when she has early morning meetings.  She returns today for follow-up.     Observations/Objective:  Generalized: Well developed, in no acute distress   Neurological examination  Mentation: Alert oriented to time, place, history taking. Follows all commands speech and language fluent  Assessment and Plan:  1: Insomnia 2.  Daytime sleepiness  Continue Ambien 1/2-1 full tablet at bedtime Continue Provigil 1 tablet  daily if needed.   Follow Up Instructions:   F/U in 6 months or sooner if needed    I discussed the assessment and treatment plan with the patient.  The patient was provided an opportunity to ask questions and all were answered to their satisfaction. The patient agreed with the plan and verbalized an understanding of the instructions.   I spent 20 minutes of face-to-face and non-face-to-face time with patient.  This included previsit chart review, lab review, study review, order entry, electronic health record documentation, patient education.     Ward Givens NP-C  [facility] [address] [place],  [address]  Phone [phone] Fax [phone] \

## 2020-07-03 ENCOUNTER — Telehealth (INDEPENDENT_AMBULATORY_CARE_PROVIDER_SITE_OTHER): Payer: No Typology Code available for payment source | Admitting: Adult Health

## 2020-07-03 ENCOUNTER — Other Ambulatory Visit: Payer: Self-pay | Admitting: Adult Health

## 2020-07-03 DIAGNOSIS — F5104 Psychophysiologic insomnia: Secondary | ICD-10-CM

## 2020-07-03 DIAGNOSIS — R400 Somnolence: Secondary | ICD-10-CM | POA: Diagnosis not present

## 2020-07-03 MED ORDER — ZOLPIDEM TARTRATE 10 MG PO TABS: 5.0000 mg | ORAL_TABLET | Freq: Every evening | ORAL | 1 refills | Status: DC | PRN

## 2020-07-03 MED ORDER — MODAFINIL 100 MG PO TABS: 100.0000 mg | ORAL_TABLET | Freq: Every day | ORAL | 1 refills | Status: DC

## 2020-07-03 NOTE — Progress Notes (Signed)
Skin , left posterior neck SEBORRHEIC KERATOSIS, IRRITATED, CRUSTED  This is a benign growth or "wisdom spot". No additional treatment is needed.

## 2020-07-05 ENCOUNTER — Telehealth: Payer: Self-pay

## 2020-07-05 NOTE — Telephone Encounter (Signed)
The PA was already done and denied. Doxycycline 40mg  is a plan exclusion through her plan per the denial letter. They listed Doxycycline 50mg  as the preferred medication.

## 2020-07-05 NOTE — Telephone Encounter (Signed)
She failed that. Can we do a PA and see if they'll cover?

## 2020-07-05 NOTE — Telephone Encounter (Signed)
Doxycycline 40mg  not covered by insurance. Okay to switch to Doxycycline 20mg  BID?

## 2020-07-09 ENCOUNTER — Telehealth: Payer: Self-pay

## 2020-07-09 NOTE — Telephone Encounter (Signed)
Left msg for patient advising bx benign SK, can treat with LN2 if becomes bothersome, JS

## 2020-07-11 ENCOUNTER — Other Ambulatory Visit: Payer: Self-pay | Admitting: Dermatology

## 2020-07-11 ENCOUNTER — Telehealth: Payer: Self-pay | Admitting: Dermatology

## 2020-07-11 MED ORDER — DOXYCYCLINE MONOHYDRATE 100 MG PO CAPS: ORAL_CAPSULE | ORAL | 3 refills | Status: DC

## 2020-07-11 MED ORDER — VALACYCLOVIR HCL 1 G PO TABS
ORAL_TABLET | ORAL | 2 refills | Status: DC
Start: 2020-07-11 — End: 2020-07-11

## 2020-07-11 NOTE — Telephone Encounter (Signed)
Will send in valacyclovir 2 gram q12 hours x 1 day at onset of symptoms, repeat as needed.  If she wants in future, ok to send in triamcinolone dental paste to use qhs prn to oral ulcers.  Discussed option for rosacea since oracea not covered. Will continue doxycycline 20 mg BID and then for flares increased to doxycycline monohydrate 100 mg BID.

## 2020-07-24 ENCOUNTER — Other Ambulatory Visit: Payer: Self-pay | Admitting: Internal Medicine

## 2020-07-24 ENCOUNTER — Telehealth: Payer: Self-pay | Admitting: Internal Medicine

## 2020-07-24 NOTE — Telephone Encounter (Signed)
Pt needs a refill on Progesterone Micronized 10 % CREA for 90 days sent to [facility]

## 2020-07-24 NOTE — Telephone Encounter (Signed)
rx sent in that was sent to me.  Please confirm with pharmacy was sent in correctly.  You also have a message about progesterone refill.

## 2020-07-24 NOTE — Telephone Encounter (Signed)
This is a prescription that is faxed.  Do we have a fax from pharmacy?

## 2020-07-24 NOTE — Telephone Encounter (Signed)
Patient has not had this since 2016 by a historical provider. please advise.

## 2020-07-25 NOTE — Telephone Encounter (Signed)
Requested fax from pharmacy

## 2020-07-25 NOTE — Telephone Encounter (Signed)
Faxed to pharmacy

## 2020-07-25 NOTE — Telephone Encounter (Signed)
Sent written rx to pharmacy

## 2020-07-26 ENCOUNTER — Other Ambulatory Visit: Payer: Self-pay | Admitting: Ophthalmology

## 2020-07-31 ENCOUNTER — Telehealth (INDEPENDENT_AMBULATORY_CARE_PROVIDER_SITE_OTHER): Payer: No Typology Code available for payment source | Admitting: Internal Medicine

## 2020-07-31 ENCOUNTER — Encounter: Payer: Self-pay | Admitting: Internal Medicine

## 2020-07-31 ENCOUNTER — Other Ambulatory Visit: Payer: Self-pay

## 2020-07-31 DIAGNOSIS — D638 Anemia in other chronic diseases classified elsewhere: Secondary | ICD-10-CM

## 2020-07-31 DIAGNOSIS — D696 Thrombocytopenia, unspecified: Secondary | ICD-10-CM

## 2020-07-31 DIAGNOSIS — R739 Hyperglycemia, unspecified: Secondary | ICD-10-CM | POA: Diagnosis not present

## 2020-07-31 DIAGNOSIS — F439 Reaction to severe stress, unspecified: Secondary | ICD-10-CM | POA: Diagnosis not present

## 2020-07-31 NOTE — Progress Notes (Signed)
Patient ID: Kimberly Figueroa, female   DOB: [birth year], 64 y.o.   MRN: [id number]   Virtual Visit via telephone Note  This visit type was conducted due to national recommendations for restrictions regarding the COVID-19 pandemic (e.g. social distancing).  This format is felt to be most appropriate for this patient at this time.  All issues noted in this document were discussed and addressed.  No physical exam was performed (except for noted visual exam findings with Video Visits).   I connected with Kimberly Figueroa by telephone and verified that I am speaking with the correct person using two identifiers. Location patient: home Location provider: work Persons participating in the virtual visit: patient, provider  The limitations, risks, security and privacy concerns of performing an evaluation and management service by telephone and the availability of in person appointments have been discussed.  It has also been discussed with the patient that there may be a patient responsible charge related to this service. The patient expressed understanding and agreed to proceed.   Reason for visit: scheduled follow up.    HPI: Here to follow up . Increased stress recently.  Discussed.  Has good support.   Does not feel needs anything more at this time.  Tries to stay active.  No chest pain or sob reported.  No abdominal pain reported.  Some issues with constipation.  Did a maneuver - constipation resolved.  Has had some ongoing back issues.  Recent left hip flare.  Also right finger - OA.  Was referred to PT.  Has not been yet.  Due to f/u next week.     ROS: See pertinent positives and negatives per HPI.  Past Medical History:  Diagnosis Date  . Endometriosis   . History of frequent urinary tract infections    s/p bladder dilatation (11/05)  . Hypersomnia, recurrent 06/28/2014  . ITP (idiopathic thrombocytopenic purpura)   . Pneumonia   . Rosacea     Past Surgical History:  Procedure  Laterality Date  . bladder dilatation  11/05  . CATARACT EXTRACTION W/PHACO Right 12/13/2019   Procedure: CATARACT EXTRACTION PHACO AND INTRAOCULAR LENS PLACEMENT ([place]) RIGHT VIVITY TORIC LENS 4.35 00:30.2;  Surgeon: Birder Robson, MD;  Location: [facility];  Service: Ophthalmology;  Laterality: Right;  . CATARACT EXTRACTION W/PHACO Left 01/10/2020   Procedure: CATARACT EXTRACTION PHACO AND INTRAOCULAR LENS PLACEMENT ([facility]) LEFT VIVITY TORIC LENS 6.19  00:32.8;  Surgeon: Birder Robson, MD;  Location: [facility];  Service: Ophthalmology;  Laterality: Left;  . CESAREAN SECTION  1993  . COLONOSCOPY    . laproscopic surgery  10/06   found to have endometriosis  . SHOULDER SURGERY Left 2008  . TONSILLECTOMY  1961  . VAGINAL DELIVERY  1987  . VAGINAL HYSTERECTOMY  02/07    Family History  Problem Relation Age of Onset  . Alcohol abuse Mother   . Varicose Veins Mother   . Diabetes Father   . Hearing loss Father   . Heart disease Father   . Crohn's disease Father   . Crohn's disease Paternal Grandfather   . Breast cancer Neg Hx   . Colon cancer Neg Hx     SOCIAL HX: reviewed.    Current Outpatient Medications:  .  citalopram (CELEXA) 40 MG tablet, TAKE 1 TABLET (40 MG TOTAL) BY MOUTH DAILY., Disp: 90 tablet, Rfl: 1 .  cyclobenzaprine (FLEXERIL) 5 MG tablet, TAKE 1 TABLET BY MOUTH AT BEDTIME AS NEEDED FOR MUSCLE SPASM. CAN CAUSE DROWSINESS.,  Disp: 20 tablet, Rfl: 0 .  doxycycline (MONODOX) 100 MG capsule, For rosacea flares, take one capsule twice daily with food INSTEAD OF DOXYCYCLINE 20 MG tablets., Disp: 60 capsule, Rfl: 3 .  doxycycline (ORACEA) 40 MG capsule, Take 1 capsule (40 mg total) by mouth every morning., Disp: 90 capsule, Rfl: 1 .  doxycycline (PERIOSTAT) 20 MG tablet, Take 1 tablet (20 mg total) by mouth 2 (two) times daily. Take with food., Disp: 60 tablet, Rfl: 0 .  hydrocortisone (ANUSOL-HC) 25 MG suppository, Place 1 suppository (25 mg total)  rectally 2 (two) times daily., Disp: 14 suppository, Rfl: 0 .  meloxicam (MOBIC) 7.5 MG tablet, Take 7.5 mg by mouth 2 (two) times daily., Disp: , Rfl:  .  modafinil (PROVIGIL) 100 MG tablet, Take 1 tablet (100 mg total) by mouth daily., Disp: 90 tablet, Rfl: 1 .  Multiple Vitamins-Minerals (MULTIVITAMIN GUMMIES ADULTS PO), Take by mouth. VitaCraves, Disp: , Rfl:  .  Progesterone Micronized 10 % CREA, Apply 104m topically every day., Disp: 1 g, Rfl: 1 .  tretinoin (RETIN-A) 0.025 % cream, Apply topically at bedtime., Disp: 135 g, Rfl: 0 .  UNABLE TO FIND, 2 (two) times daily as needed. Skin Medicinals(cream):  Azelaic Acid 15%, Ivermectin 1%, Metronidazole 1%, Disp: , Rfl:  .  valACYclovir (VALTREX) 1000 MG tablet, Take 2 tablets every 12 hours for one day at onset of symptoms., Disp: 12 tablet, Rfl: 2 .  XIIDRA 5 % SOLN, , Disp: , Rfl:  .  zolpidem (AMBIEN) 10 MG tablet, Take 0.5-1 tablets (5-10 mg total) by mouth at bedtime as needed. for sleep, Disp: 90 tablet, Rfl: 1  EXAM:  VITALS per patient if applicable:  GENERAL: alert.  Sounds to be in no acute distress.  Answering questions appropriately.   PSYCH/NEURO: pleasant and cooperative, no obvious depression or anxiety, speech and thought processing grossly intact  ASSESSMENT AND PLAN:  Discussed the following assessment and plan:  Problem List Items Addressed This Visit    Thrombocytopenia ([place]    Has known history of ITP.  Has been worked up by hematology.  Follow cbc.       Stress    Increased stress as outlined.  Discussed.  Has good support.  Does not feel needs anything more at this time. Follow.       Hyperglycemia    Low carb diet and exercise.  Follow met b and a1c.        Anemia    Follow cbc.           I discussed the assessment and treatment plan with the patient. The patient was provided an opportunity to ask questions and all were answered. The patient agreed with the plan and demonstrated an understanding  of the instructions.   The patient was advised to call back or seek an in-person evaluation if the symptoms worsen or if the condition fails to improve as anticipated.  I provided 23 minutes of non-face-to-face time during this encounter.   CEinar Pheasant MD

## 2020-08-05 ENCOUNTER — Encounter: Payer: Self-pay | Admitting: Internal Medicine

## 2020-08-05 NOTE — Assessment & Plan Note (Signed)
Has known history of ITP.  Has been worked up by hematology.  Follow cbc.

## 2020-08-05 NOTE — Assessment & Plan Note (Signed)
Follow cbc.  

## 2020-08-05 NOTE — Assessment & Plan Note (Signed)
Increased stress as outlined.  Discussed.  Has good support.  Does not feel needs anything more at this time.  Follow.  

## 2020-08-05 NOTE — Assessment & Plan Note (Signed)
Low carb diet and exercise.  Follow met b and a1c.   

## 2020-08-08 ENCOUNTER — Other Ambulatory Visit: Payer: Self-pay | Admitting: Otolaryngology

## 2020-08-27 ENCOUNTER — Other Ambulatory Visit: Payer: Self-pay | Admitting: Dermatology

## 2020-08-27 MED FILL — DOXYCYCLINE HYCLATE 20 MG T: 20 | 90 days supply | Qty: 180 | Fill #0

## 2020-09-06 MED FILL — CYCLOBENZAPRINE HCL 5 MG TA: 5 | 90 days supply | Qty: 180 | Fill #0

## 2020-09-17 ENCOUNTER — Other Ambulatory Visit: Payer: Self-pay | Admitting: Internal Medicine

## 2020-09-17 MED FILL — CITALOPRAM HBR 40 MG TABLET: 40 | 90 days supply | Qty: 90 | Fill #0

## 2020-10-08 ENCOUNTER — Other Ambulatory Visit: Payer: Self-pay | Admitting: Pain Medicine

## 2020-10-15 MED FILL — oxyCODONE HCL 5 MG TABS: 5 | 5 days supply | Qty: 15 | Fill #0

## 2020-10-25 ENCOUNTER — Telehealth: Payer: Self-pay | Admitting: Adult Health

## 2020-10-25 NOTE — Telephone Encounter (Signed)
Rescheduled MyChart visit due to NP being out of office.

## 2020-11-15 ENCOUNTER — Other Ambulatory Visit (HOSPITAL_BASED_OUTPATIENT_CLINIC_OR_DEPARTMENT_OTHER): Payer: Self-pay

## 2020-11-30 ENCOUNTER — Other Ambulatory Visit (HOSPITAL_COMMUNITY): Payer: Self-pay

## 2020-11-30 ENCOUNTER — Other Ambulatory Visit: Payer: Self-pay | Admitting: Dermatology

## 2020-11-30 MED ORDER — DOXYCYCLINE HYCLATE 20 MG PO TABS
ORAL_TABLET | Freq: Two times a day (BID) | ORAL | 0 refills | Status: DC
  Filled 2020-11-30: qty 60, 30d supply, fill #0
  Filled 2021-01-15: qty 60, 30d supply, fill #1
  Filled 2021-02-14: qty 60, 30d supply, fill #2

## 2020-11-30 MED FILL — Citalopram Hydrobromide Tab 40 MG (Base Equiv): ORAL | 90 days supply | Qty: 90 | Fill #0 | Status: AC

## 2020-12-03 MED FILL — Zolpidem Tartrate Tab 10 MG: ORAL | 90 days supply | Qty: 90 | Fill #0 | Status: CN

## 2020-12-04 ENCOUNTER — Other Ambulatory Visit: Payer: Self-pay

## 2020-12-04 ENCOUNTER — Other Ambulatory Visit (HOSPITAL_COMMUNITY): Payer: Self-pay

## 2020-12-05 ENCOUNTER — Other Ambulatory Visit (HOSPITAL_COMMUNITY): Payer: Self-pay

## 2020-12-05 ENCOUNTER — Other Ambulatory Visit: Payer: Self-pay

## 2020-12-05 DIAGNOSIS — Z1231 Encounter for screening mammogram for malignant neoplasm of breast: Secondary | ICD-10-CM | POA: Diagnosis not present

## 2020-12-05 LAB — HM MAMMOGRAPHY

## 2020-12-05 MED FILL — Zolpidem Tartrate Tab 10 MG: ORAL | 90 days supply | Qty: 90 | Fill #0 | Status: AC

## 2020-12-20 ENCOUNTER — Other Ambulatory Visit (HOSPITAL_COMMUNITY): Payer: Self-pay

## 2020-12-20 DIAGNOSIS — H16223 Keratoconjunctivitis sicca, not specified as Sjogren's, bilateral: Secondary | ICD-10-CM | POA: Diagnosis not present

## 2020-12-20 DIAGNOSIS — H04123 Dry eye syndrome of bilateral lacrimal glands: Secondary | ICD-10-CM | POA: Diagnosis not present

## 2020-12-20 DIAGNOSIS — H5203 Hypermetropia, bilateral: Secondary | ICD-10-CM | POA: Diagnosis not present

## 2020-12-20 MED ORDER — XIIDRA 5 % OP SOLN
OPHTHALMIC | 3 refills | Status: DC
  Filled 2020-12-20: qty 180, 90d supply, fill #0
  Filled 2021-04-06: qty 180, 90d supply, fill #1
  Filled 2021-07-10: qty 180, 90d supply, fill #2

## 2020-12-21 ENCOUNTER — Other Ambulatory Visit (HOSPITAL_COMMUNITY): Payer: Self-pay

## 2020-12-26 ENCOUNTER — Ambulatory Visit: Payer: No Typology Code available for payment source | Admitting: Dermatology

## 2021-01-01 ENCOUNTER — Telehealth: Payer: No Typology Code available for payment source | Admitting: Adult Health

## 2021-01-08 ENCOUNTER — Other Ambulatory Visit (HOSPITAL_COMMUNITY): Payer: Self-pay

## 2021-01-08 ENCOUNTER — Telehealth: Payer: No Typology Code available for payment source | Admitting: Adult Health

## 2021-01-08 MED FILL — Tretinoin Cream 0.025%: CUTANEOUS | 30 days supply | Qty: 45 | Fill #0 | Status: AC

## 2021-01-09 ENCOUNTER — Other Ambulatory Visit (HOSPITAL_COMMUNITY): Payer: Self-pay

## 2021-01-15 ENCOUNTER — Other Ambulatory Visit (HOSPITAL_COMMUNITY): Payer: Self-pay

## 2021-01-22 ENCOUNTER — Telehealth (INDEPENDENT_AMBULATORY_CARE_PROVIDER_SITE_OTHER): Payer: No Typology Code available for payment source | Admitting: Adult Health

## 2021-01-22 DIAGNOSIS — R400 Somnolence: Secondary | ICD-10-CM

## 2021-01-22 DIAGNOSIS — F5104 Psychophysiologic insomnia: Secondary | ICD-10-CM

## 2021-01-22 NOTE — Progress Notes (Signed)
PATIENT: Kimberly Figueroa DOB: Sep 09, 1955  REASON FOR VISIT: follow up HISTORY FROM: patient  Virtual Visit via Video Note  I connected with [name] on 01/22/21 at  1:30 PM EDT by a video enabled telemedicine application located remotely at [facility] and verified that I am speaking with the correct person using two identifiers who was located at their own home.   I discussed the limitations of evaluation and management by telemedicine and the availability of in person appointments. The patient expressed understanding and agreed to proceed.   PATIENT: Kimberly Figueroa DOB: [birth year]  REASON FOR VISIT: follow up HISTORY FROM: patient  HISTORY OF PRESENT ILLNESS: Today 01/22/21:  [name] is a 65 year old female with a history of insomnia and daytime sleepiness.  She returns today for follow-up.  She states that she typically takes a half a tablet of Ambien nightly.  On occasion she does have to take the other half.  She reports on the rare occasion she will have to go ahead and take the full tablet at bedtime.  She states that she has Provigil but probably is only used 3 times in the last month.  Overall she feels that this is working well for her.  She returns today for an evaluation.  HISTORY [name] is a 65 y.o. female who has been followed in this office for insomnia and daytime sleepiness.  She was initially scheduled for MyChart video visit however she was unable to hear me so we transferred to a telephone visit.  She states that over the last month her stress levels have increased.  Therefore she has been having a difficult time sleeping and has been using 1 full tablet of Ambien.  She also reports that she has been having low back pain and is seeing pain management.  She states this also disrupts her sleep.  She states that she has Provigil but probably use it 5 to 10 days out of a month.  She typically only uses  it when she has early morning meetings.  She returns today for follow-up.    REVIEW OF SYSTEMS: Out of a complete 14 system review of symptoms, the patient complains only of the following symptoms, and all other reviewed systems are negative.  See HPI  ALLERGIES: Allergies  Allergen Reactions  . Darvon [Propoxyphene] Nausea Only  . Vicodin [Hydrocodone-Acetaminophen] Itching    HOME MEDICATIONS: Outpatient Medications Prior to Visit  Medication Sig Dispense Refill  . citalopram (CELEXA) 40 MG tablet TAKE 1 TABLET BY MOUTH DAILY. 90 tablet 1  . cyclobenzaprine (FLEXERIL) 5 MG tablet TAKE 1 TABLET BY MOUTH AT BEDTIME AS NEEDED FOR MUSCLE SPASM. CAN CAUSE DROWSINESS. 20 tablet 0  . cyclobenzaprine (FLEXERIL) 5 MG tablet TAKE 1 TABLET BY MOUTH TWICE DAILY 30 tablet 1  . cyclobenzaprine (FLEXERIL) 5 MG tablet TAKE 1 TABLET BY MOUTH TWICE DAILY 180 tablet 4  . doxycycline (MONODOX) 100 MG capsule TAKE 1 CAPSULE BY MOUTH TWICE DAILY WITH FOOD (INSTEAD OF DOXYCYCLINE 20 MG TABLETS) FOR ROSACEA FLARES 60 capsule 3  . doxycycline (ORACEA) 40 MG capsule Take 1 capsule (40 mg total) by mouth every morning. 90 capsule 1  . doxycycline (PERIOSTAT) 20 MG tablet TAKE 1 TABLET BY MOUTH 2 TIMES DAILY 180 tablet 0  . Fluocinolone Acetonide 0.01 % OIL APPLY 2 TO 3 DROPS TO EACH EAR ONCE OR TWICE WEEKLY AS NEEDED FOR ITCHING 20 mL 3  . hydrocortisone (ANUSOL-HC) 25  MG suppository Place 1 suppository (25 mg total) rectally 2 (two) times daily. 14 suppository 0  . Lifitegrast (XIIDRA) 5 % SOLN Instill 1 drop into both eyes every 12 hours as directed 180 each 3  . Lifitegrast 5 % SOLN INSTILL 1 DROP INTO BOTH EYES TWICE A DAY 180 each 0  . meloxicam (MOBIC) 7.5 MG tablet Take 7.5 mg by mouth 2 (two) times daily.    . modafinil (PROVIGIL) 100 MG tablet TAKE 1 TABLET (100 MG TOTAL) BY MOUTH DAILY. 90 tablet 1  . Multiple Vitamins-Minerals (MULTIVITAMIN GUMMIES ADULTS PO) Take by mouth. VitaCraves    . oxyCODONE  (OXY IR/ROXICODONE) 5 MG immediate release tablet TAKE 1 TABLET BY MOUTH EVERY 8 HOURS AS NEEDED 15 tablet 0  . Progesterone Micronized 10 % CREA Apply 93ml topically every day. 1 g 1  . tretinoin (RETIN-A) 0.025 % cream APPLY TOPICALLY AT BEDTIME AS DIRECTED 135 g 0  . UNABLE TO FIND 2 (two) times daily as needed. Skin Medicinals(cream):  Azelaic Acid 15%, Ivermectin 1%, Metronidazole 1%    . valACYclovir (VALTREX) 1000 MG tablet TAKE 2 TABLETS BY MOUTH EVERY 12 HOURS FOR 1 DAY AT ONSET OF SYMPTOMS. 12 tablet 2  . XIIDRA 5 % SOLN     . zolpidem (AMBIEN) 10 MG tablet TAKE 1/2 TO 1 TABLET BY MOUTH AT BEDTIME AS NEEDED FOR SLEEP 90 tablet 1   No facility-administered medications prior to visit.    PAST MEDICAL HISTORY: Past Medical History:  Diagnosis Date  . Endometriosis   . History of frequent urinary tract infections    s/p bladder dilatation (11/05)  . Hypersomnia, recurrent 06/28/2014  . ITP (idiopathic thrombocytopenic purpura)   . Pneumonia   . Rosacea     PAST SURGICAL HISTORY: Past Surgical History:  Procedure Laterality Date  . bladder dilatation  11/05  . CATARACT EXTRACTION W/PHACO Right 12/13/2019   Procedure: CATARACT EXTRACTION PHACO AND INTRAOCULAR LENS PLACEMENT ([place]) RIGHT VIVITY TORIC LENS 4.35 00:30.2;  Surgeon: Birder Robson, MD;  Location: [facility];  Service: Ophthalmology;  Laterality: Right;  . CATARACT EXTRACTION W/PHACO Left 01/10/2020   Procedure: CATARACT EXTRACTION PHACO AND INTRAOCULAR LENS PLACEMENT ([facility]) LEFT VIVITY TORIC LENS 6.19  00:32.8;  Surgeon: Birder Robson, MD;  Location: [facility];  Service: Ophthalmology;  Laterality: Left;  . CESAREAN SECTION  1993  . COLONOSCOPY    . laproscopic surgery  10/06   found to have endometriosis  . SHOULDER SURGERY Left 2008  . TONSILLECTOMY  1961  . VAGINAL DELIVERY  1987  . VAGINAL HYSTERECTOMY  02/07    FAMILY HISTORY: Family History  Problem Relation Age of Onset  . Alcohol  abuse Mother   . Varicose Veins Mother   . Diabetes Father   . Hearing loss Father   . Heart disease Father   . Crohn's disease Father   . Crohn's disease Paternal Grandfather   . Breast cancer Neg Hx   . Colon cancer Neg Hx     SOCIAL HISTORY: Social History   Socioeconomic History  . Marital status: Married    Spouse name: Gwyndolyn Saxon  . Number of children: 2  . Years of education: college  . Highest education level: Not on file  Occupational History  . Occupation: Advertising account planner. Director    Employer: [employer]  Tobacco Use  . 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 Smokeless tobacco: Never Used  Vaping Use  . Vaping Use: Never used  Substance and Sexual  Activity  . Alcohol use: Yes    Alcohol/week: 2.0 standard drinks    Types: 2 Glasses of wine per week  . Drug use: No  . Sexual activity: Not on file  Other Topics Concern  . Not on file  Social History Narrative   Patient consumes 6-8 cups tea daily, is right handed   Social Determinants of Health   Financial Resource Strain: Not on file  Food Insecurity: Not on file  Transportation Needs: Not on file  Physical Activity: Not on file  Stress: Not on file  Social Connections: Not on file  Intimate Partner Violence: Not on file      PHYSICAL EXAM Generalized: Well developed, in no acute distress   Neurological examination  Mentation: Alert oriented to time, place, history taking. Follows all commands speech and language fluent Cranial nerve II-XII:Extraocular movements were full. Facial symmetry noted. uvula tongue midline. Head turning and shoulder shrug  were normal and symmetric. Motor: Good strength throughout subjectively per patient Sensory: Sensory testing is intact to soft touch on all 4 extremities subjectively per patient Coordination: Cerebellar testing reveals good finger-nose-finger  Gait and station: Patient is able to stand from a seated position. gait is normal.  Reflexes: UTA  DIAGNOSTIC DATA (LABS,  IMAGING, TESTING) - I reviewed patient records, labs, notes, testing and imaging myself where available.  Lab Results  Component Value Date   WBC 4.0 05/04/2020   HGB 12.5 05/04/2020   HCT 38.2 05/04/2020   MCV 92.6 05/04/2020   PLT 171.0 05/04/2020      Component Value Date/Time   NA 142 05/04/2020 0906   K 3.8 05/04/2020 0906   CL 105 05/04/2020 0906   CO2 32 05/04/2020 0906   GLUCOSE 89 05/04/2020 0906   BUN 12 05/04/2020 0906   CREATININE 0.68 05/04/2020 0906   CALCIUM 8.9 05/04/2020 0906   PROT 6.3 05/04/2020 0906   ALBUMIN 4.2 05/04/2020 0906   AST 21 05/04/2020 0906   ALT 22 05/04/2020 0906   ALKPHOS 82 05/04/2020 0906   BILITOT 0.4 05/04/2020 0906   Lab Results  Component Value Date   CHOL 168 05/04/2020   HDL 72.20 05/04/2020   LDLCALC 86 05/04/2020   TRIG 46.0 05/04/2020   CHOLHDL 2 05/04/2020   Lab Results  Component Value Date   HGBA1C 6.0 05/04/2020   No results found for: VITAMINB12 Lab Results  Component Value Date   TSH 3.07 05/04/2020      ASSESSMENT AND PLAN 65 y.o. year old female  has a past medical history of Endometriosis, History of frequent urinary tract infections, Hypersomnia, recurrent (06/28/2014), ITP (idiopathic thrombocytopenic purpura), Pneumonia, and Rosacea. here with:  1.  Insomnia 2.  Daytime sleepiness  --Continue Ambien 10 mg half a tablet to 1 tablet at bedtime as needed -- Continue Provigil 100 mg 1 tablet daily if needed -- Advised if symptoms worsen or she develops new symptoms they should let us know -- Follow-up in 6 months or sooner if needed   . Ward Givens, MSN, NP-C 01/22/2021, 2:48 PM [facility] [address], [place], [place] [address] [phone]

## 2021-01-31 ENCOUNTER — Encounter: Payer: No Typology Code available for payment source | Admitting: Internal Medicine

## 2021-02-11 ENCOUNTER — Other Ambulatory Visit: Payer: Self-pay

## 2021-02-11 ENCOUNTER — Other Ambulatory Visit (HOSPITAL_COMMUNITY): Payer: Self-pay

## 2021-02-14 ENCOUNTER — Other Ambulatory Visit (HOSPITAL_COMMUNITY): Payer: Self-pay

## 2021-02-15 ENCOUNTER — Other Ambulatory Visit: Payer: Self-pay

## 2021-02-15 ENCOUNTER — Other Ambulatory Visit (HOSPITAL_COMMUNITY): Payer: Self-pay

## 2021-02-19 ENCOUNTER — Other Ambulatory Visit (HOSPITAL_COMMUNITY): Payer: Self-pay

## 2021-02-20 ENCOUNTER — Other Ambulatory Visit (HOSPITAL_COMMUNITY): Payer: Self-pay

## 2021-02-20 ENCOUNTER — Other Ambulatory Visit: Payer: Self-pay

## 2021-02-21 ENCOUNTER — Other Ambulatory Visit (HOSPITAL_COMMUNITY): Payer: Self-pay

## 2021-02-26 ENCOUNTER — Other Ambulatory Visit (HOSPITAL_COMMUNITY): Payer: Self-pay

## 2021-03-07 ENCOUNTER — Other Ambulatory Visit (HOSPITAL_COMMUNITY): Payer: Self-pay

## 2021-03-07 DIAGNOSIS — M5416 Radiculopathy, lumbar region: Secondary | ICD-10-CM | POA: Diagnosis not present

## 2021-03-07 DIAGNOSIS — M5136 Other intervertebral disc degeneration, lumbar region: Secondary | ICD-10-CM | POA: Diagnosis not present

## 2021-03-07 MED ORDER — OXYCODONE HCL 5 MG PO TABS
ORAL_TABLET | ORAL | 0 refills | Status: AC
  Filled 2021-03-07: qty 90, 30d supply, fill #0

## 2021-03-07 MED ORDER — CYCLOBENZAPRINE HCL 5 MG PO TABS
ORAL_TABLET | ORAL | 3 refills | Status: DC
  Filled 2021-03-07: qty 180, 90d supply, fill #0
  Filled 2021-08-10: qty 180, 90d supply, fill #1

## 2021-03-11 ENCOUNTER — Other Ambulatory Visit (HOSPITAL_COMMUNITY): Payer: Self-pay

## 2021-03-12 ENCOUNTER — Other Ambulatory Visit (HOSPITAL_COMMUNITY): Payer: Self-pay

## 2021-03-12 ENCOUNTER — Other Ambulatory Visit: Payer: Self-pay | Admitting: Internal Medicine

## 2021-03-13 ENCOUNTER — Other Ambulatory Visit (HOSPITAL_COMMUNITY): Payer: Self-pay

## 2021-03-14 ENCOUNTER — Other Ambulatory Visit (HOSPITAL_COMMUNITY): Payer: Self-pay

## 2021-03-14 MED ORDER — CITALOPRAM HYDROBROMIDE 40 MG PO TABS
ORAL_TABLET | Freq: Every day | ORAL | 1 refills | Status: DC
  Filled 2021-03-14: qty 90, 90d supply, fill #0
  Filled 2021-06-23: qty 90, 90d supply, fill #1

## 2021-03-18 ENCOUNTER — Other Ambulatory Visit: Payer: Self-pay | Admitting: Dermatology

## 2021-03-19 ENCOUNTER — Other Ambulatory Visit (HOSPITAL_COMMUNITY): Payer: Self-pay

## 2021-03-20 DIAGNOSIS — M5416 Radiculopathy, lumbar region: Secondary | ICD-10-CM | POA: Diagnosis not present

## 2021-03-20 DIAGNOSIS — Z7689 Persons encountering health services in other specified circumstances: Secondary | ICD-10-CM | POA: Diagnosis not present

## 2021-03-20 DIAGNOSIS — M5136 Other intervertebral disc degeneration, lumbar region: Secondary | ICD-10-CM | POA: Diagnosis not present

## 2021-03-20 DIAGNOSIS — M545 Low back pain, unspecified: Secondary | ICD-10-CM | POA: Diagnosis not present

## 2021-03-28 ENCOUNTER — Other Ambulatory Visit (HOSPITAL_COMMUNITY): Payer: Self-pay

## 2021-03-28 ENCOUNTER — Ambulatory Visit (INDEPENDENT_AMBULATORY_CARE_PROVIDER_SITE_OTHER): Payer: Self-pay | Admitting: Dermatology

## 2021-03-28 ENCOUNTER — Other Ambulatory Visit: Payer: Self-pay

## 2021-03-28 DIAGNOSIS — L700 Acne vulgaris: Secondary | ICD-10-CM

## 2021-03-28 DIAGNOSIS — L719 Rosacea, unspecified: Secondary | ICD-10-CM

## 2021-03-28 MED ORDER — ZILXI 1.5 % EX FOAM: CUTANEOUS | 2 refills | Status: DC

## 2021-03-28 MED ORDER — DOXYCYCLINE HYCLATE 20 MG PO TABS
20.0000 mg | ORAL_TABLET | Freq: Two times a day (BID) | ORAL | 2 refills | Status: DC
  Filled 2021-03-28: qty 60, 30d supply, fill #0
  Filled 2021-05-02: qty 60, 30d supply, fill #1
  Filled 2021-06-06: qty 60, 30d supply, fill #2

## 2021-03-28 NOTE — Patient Instructions (Addendum)
[place] for BBL laser.  Continue doxycycline '20mg'$  twice daily with food.  Doxycycline should be taken with food to prevent nausea. Do not lay down for 30 minutes after taking. Be cautious with sun exposure and use good sun protection while on this medication. Pregnant women should not take this medication.   Continue Skin Medicinals triple cream daily (morning) Continue tretinoin 0.025% cream at bedtime followed by a thin layer of Zilxi.  Topical retinoid medications like tretinoin/Retin-A, adapalene/Differin, tazarotene/Fabior, and Epiduo/Epiduo Forte can cause dryness and irritation when first started. Only apply a pea-sized amount to the entire affected area. Avoid applying it around the eyes, edges of mouth and creases at the nose. If you experience irritation, use a good moisturizer first and/or apply the medicine less often. If you are doing well with the medicine, you can increase how often you use it until you are applying every night. Be careful with sun protection while using this medication as it can make you sensitive to the sun. This medicine should not be used by pregnant women.   If you have any questions or concerns for your doctor, please call our main line at [phone] and press option 4 to reach your doctor's medical assistant. If no one answers, please leave a voicemail as directed and we will return your call as soon as possible. Messages left after 4 pm will be answered the following business day.   You may also send [place] a message via [place]. We typically respond to MyChart messages within 1-2 business days.  For prescription refills, please ask your pharmacy to contact our office. Our fax number is [phone].  If you have an urgent issue when the clinic is closed that cannot wait until the next business day, you can page your doctor at the number below.    Please note that while we do our best to be available for urgent issues outside of office hours, we are not  available 24/7.   If you have an urgent issue and are unable to reach [place], you may choose to seek medical care at your doctor's office, retail clinic, urgent care center, or emergency room.  If you have a medical emergency, please immediately call 911 or go to the emergency department.  Pager Numbers  - Dr. Nehemiah Massed: [phone]  - Dr. Laurence Ferrari: [phone]  - Dr. Nicole Kindred: [phone]  In the event of inclement weather, please call our main line at [phone] for an update on the status of any delays or closures.  Dermatology Medication Tips: Please keep the boxes that topical medications come in in order to help keep track of the instructions about where and how to use these. Pharmacies typically print the medication instructions only on the boxes and not directly on the medication tubes.   If your medication is too expensive, please contact our office at [phone] option 4 or send [place] a message through [place].   We are unable to tell what your co-pay for medications will be in advance as this is different depending on your insurance coverage. However, we may be able to find a substitute medication at lower cost or fill out paperwork to get insurance to cover a needed medication.   If a prior authorization is required to get your medication covered by your insurance company, please allow [place] 1-2 business days to complete this process.  Drug prices often vary depending on where the prescription is filled and some pharmacies may offer cheaper prices.  The website www.goodrx.com contains coupons  for medications through different pharmacies. The prices here do not account for what the cost may be with help from insurance (it may be cheaper with your insurance), but the website can give you the price if you did not use any insurance.  - You can print the associated coupon and take it with your prescription to the pharmacy.  - You may also stop by our office during regular business hours  and pick up a GoodRx coupon card.  - If you need your prescription sent electronically to a different pharmacy, notify our office through [facility] or by phone at [phone] option 4.

## 2021-03-28 NOTE — Progress Notes (Signed)
   Follow-Up Visit   Subjective  Kimberly Figueroa is a 65 y.o. female who presents for the following: Rosacea (Patient here today for 6 month rosacea and acne follow up. Patient currently using SkinMedicinals triple cream and tretinoin 0.025%. She is also taking doxycycline '20mg'$  twice daily. Patient advises she has had improvement with occasional break out. ).   The following portions of the chart were reviewed this encounter and updated as appropriate:   Tobacco  Allergies  Meds  Problems  Med Hx  Surg Hx  Fam Hx      Review of Systems:  No other skin or systemic complaints except as noted in HPI or Assessment and Plan.  Objective  Well appearing patient in no apparent distress; mood and affect are within normal limits.  A focused examination was performed including face, chest. Relevant physical exam findings are noted in the Assessment and Plan.  face Rare open comedone  face Mid face erythema, rare small inflammatory papule   Assessment & Plan  Acne vulgaris face  Chronic condition with duration or expected duration over one year. Currently well-controlled.  Continue tretinoin 0.025% at bedtime  Topical retinoid medications like tretinoin/Retin-A, adapalene/Differin, tazarotene/Fabior, and Epiduo/Epiduo Forte can cause dryness and irritation when first started. Only apply a pea-sized amount to the entire affected area. Avoid applying it around the eyes, edges of mouth and creases at the nose. If you experience irritation, use a good moisturizer first and/or apply the medicine less often. If you are doing well with the medicine, you can increase how often you use it until you are applying every night. Be careful with sun protection while using this medication as it can make you sensitive to the sun. This medicine should not be used by pregnant women.    Related Medications tretinoin (RETIN-A) 0.025 % cream APPLY TOPICALLY AT BEDTIME AS  DIRECTED  Rosacea face  Chronic condition with duration or expected duration over one year. Condition is bothersome to patient. Not currently at goal.  Rosacea is a chronic progressive skin condition usually affecting the face of adults, causing redness and/or acne bumps. It is treatable but not curable. It sometimes affects the eyes (ocular rosacea) as well. It may respond to topical and/or systemic medication and can flare with stress, sun exposure, alcohol, exercise and some foods.  Daily application of broad spectrum spf 30+ sunscreen to face is recommended to reduce flares.  Start Zilxi at bedtime to affected area.  Sample given to patient. Lot # [id number]  Exp: July 2022. Patient to call if this keeps rosacea calmed down.  Continue Skin Medicinals metronidazole/ivermectin/azelaic acid twice daily as needed to affected areas on the face.   Continue doxycycline '20mg'$  twice daily with food.   Doxycycline should be taken with food to prevent nausea. Do not lay down for 30 minutes after taking. Be cautious with sun exposure and use good sun protection while on this medication. Pregnant women should not take this medication.    doxycycline (PERIOSTAT) 20 MG tablet - face Take 1 tablet  by mouth 2 times daily. Take with food  Minocycline HCl Micronized (ZILXI) 1.5 % FOAM - face Apply to affected areas at bedtime as directed  Return in about 3 months (around 06/28/2021) for TBSE, Rosacea.  Graciella Belton, RMA, am acting as scribe for Forest Gleason, MD .  Documentation: I have reviewed the above documentation for accuracy and completeness, and I agree with the above.  Forest Gleason, MD

## 2021-03-30 DIAGNOSIS — G8929 Other chronic pain: Secondary | ICD-10-CM | POA: Diagnosis not present

## 2021-03-30 DIAGNOSIS — M545 Low back pain, unspecified: Secondary | ICD-10-CM | POA: Diagnosis not present

## 2021-04-03 DIAGNOSIS — R030 Elevated blood-pressure reading, without diagnosis of hypertension: Secondary | ICD-10-CM | POA: Diagnosis not present

## 2021-04-03 DIAGNOSIS — M5416 Radiculopathy, lumbar region: Secondary | ICD-10-CM | POA: Diagnosis not present

## 2021-04-06 ENCOUNTER — Other Ambulatory Visit: Payer: Self-pay | Admitting: Adult Health

## 2021-04-06 ENCOUNTER — Other Ambulatory Visit (HOSPITAL_COMMUNITY): Payer: Self-pay

## 2021-04-08 ENCOUNTER — Other Ambulatory Visit: Payer: Self-pay

## 2021-04-08 DIAGNOSIS — M5416 Radiculopathy, lumbar region: Secondary | ICD-10-CM | POA: Diagnosis not present

## 2021-04-09 ENCOUNTER — Encounter: Payer: Self-pay | Admitting: Dermatology

## 2021-04-09 ENCOUNTER — Other Ambulatory Visit: Payer: Self-pay

## 2021-04-09 NOTE — Telephone Encounter (Signed)
Last OV: 01/22/21 Next OV: 07/16/21  [place] Registry Last fill: 12/05/20 #90 (90 day supply)

## 2021-04-10 ENCOUNTER — Other Ambulatory Visit (HOSPITAL_COMMUNITY): Payer: Self-pay

## 2021-04-10 ENCOUNTER — Telehealth: Payer: Self-pay | Admitting: Adult Health

## 2021-04-10 ENCOUNTER — Other Ambulatory Visit: Payer: Self-pay

## 2021-04-10 ENCOUNTER — Other Ambulatory Visit: Payer: Self-pay | Admitting: Adult Health

## 2021-04-10 MED ORDER — ZOLPIDEM TARTRATE 10 MG PO TABS
ORAL_TABLET | Freq: Every evening | ORAL | 1 refills | Status: DC | PRN
  Filled 2021-04-10: qty 90, 90d supply, fill #0
  Filled 2021-07-22: qty 90, 90d supply, fill #1

## 2021-04-10 MED ORDER — ZOLPIDEM TARTRATE 10 MG PO TABS
ORAL_TABLET | Freq: Every evening | ORAL | 1 refills | Status: DC | PRN
  Filled 2021-04-10: qty 90, 90d supply, fill #0

## 2021-04-10 NOTE — Telephone Encounter (Signed)
Last filled 12/05/20 #90 per [place] registry.

## 2021-04-10 NOTE — Telephone Encounter (Signed)
Per [place] registry, last filled on 12/05/2020 #90. Rx refill sent to [facility] in other encounter. I also spoke with [facility] pharmacy and canceled the Rx refills.

## 2021-04-10 NOTE — Telephone Encounter (Signed)
Pt called stating her zolpidem (AMBIEN) 10 MG tablet needs to be sent to [facility].

## 2021-04-23 ENCOUNTER — Encounter: Payer: Self-pay | Admitting: Internal Medicine

## 2021-04-23 ENCOUNTER — Ambulatory Visit (INDEPENDENT_AMBULATORY_CARE_PROVIDER_SITE_OTHER): Payer: 59 | Admitting: Internal Medicine

## 2021-04-23 VITALS — BP 122/70 | HR 78 | Temp 97.8°F | Resp 16 | Ht 67.0 in | Wt 154.8 lb

## 2021-04-23 DIAGNOSIS — D696 Thrombocytopenia, unspecified: Secondary | ICD-10-CM

## 2021-04-23 DIAGNOSIS — L719 Rosacea, unspecified: Secondary | ICD-10-CM

## 2021-04-23 DIAGNOSIS — Z13220 Encounter for screening for lipoid disorders: Secondary | ICD-10-CM | POA: Diagnosis not present

## 2021-04-23 DIAGNOSIS — Z1211 Encounter for screening for malignant neoplasm of colon: Secondary | ICD-10-CM | POA: Diagnosis not present

## 2021-04-23 DIAGNOSIS — D638 Anemia in other chronic diseases classified elsewhere: Secondary | ICD-10-CM

## 2021-04-23 DIAGNOSIS — D649 Anemia, unspecified: Secondary | ICD-10-CM

## 2021-04-23 DIAGNOSIS — M545 Low back pain, unspecified: Secondary | ICD-10-CM | POA: Diagnosis not present

## 2021-04-23 DIAGNOSIS — Z0000 Encounter for general adult medical examination without abnormal findings: Secondary | ICD-10-CM

## 2021-04-23 DIAGNOSIS — R739 Hyperglycemia, unspecified: Secondary | ICD-10-CM

## 2021-04-23 DIAGNOSIS — F439 Reaction to severe stress, unspecified: Secondary | ICD-10-CM

## 2021-04-23 NOTE — Progress Notes (Signed)
Patient ID: Kimberly Figueroa, female   DOB: Jul 23, 1956, 65 y.o.   MRN: [id number]   Subjective:    Patient ID: Kimberly Figueroa, female    DOB: 06/20/1956, 65 y.o.   MRN: [id number]  This visit occurred during the SARS-CoV-2 public health emergency.  Safety protocols were in place, including screening questions prior to the visit, additional usage of staff PPE, and extensive cleaning of exam room while observing appropriate contact time as indicated for disinfecting solutions.    Chief Complaint  Patient presents with   Annual Exam   .   HPI Patient here for her physical exam.  Increased stress. Increased stress with her father's health issues.  Also lost job.  Discussed.  Overall appears to be handling things relatively well.  Was having problems with increased back pain.  S/p ESI.  Helped. Pain relieved.  No chest pain or sob reported.  No abdominal pain or bowel change reported.  Blood pressure doing well.    Past Medical History:  Diagnosis Date   Endometriosis    History of frequent urinary tract infections    s/p bladder dilatation (11/05)   Hypersomnia, recurrent 06/28/2014   ITP (idiopathic thrombocytopenic purpura)    Pneumonia    Rosacea    Past Surgical History:  Procedure Laterality Date   bladder dilatation  11/05   CATARACT EXTRACTION W/PHACO Right 12/13/2019   Procedure: CATARACT EXTRACTION PHACO AND INTRAOCULAR LENS PLACEMENT ([place]) RIGHT VIVITY TORIC LENS 4.35 00:30.2;  Surgeon: Birder Robson, MD;  Location: [place];  Service: Ophthalmology;  Laterality: Right;   CATARACT EXTRACTION W/PHACO Left 01/10/2020   Procedure: CATARACT EXTRACTION PHACO AND INTRAOCULAR LENS PLACEMENT ([facility]) LEFT VIVITY TORIC LENS 6.19  00:32.8;  Surgeon: Birder Robson, MD;  Location: [place];  Service: Ophthalmology;  Laterality: Left;   CESAREAN SECTION  1993   COLONOSCOPY     laproscopic surgery  10/06   found to have endometriosis   SHOULDER SURGERY  Left 2008   [place]  02/07   Family History  Problem Relation Age of Onset   Alcohol abuse Mother    Varicose Veins Mother    Diabetes Father    Hearing loss Father    Heart disease Father    Crohn's disease Father    Crohn's disease Paternal Grandfather    Breast cancer Neg Hx    Colon cancer Neg Hx    Social History   Socioeconomic History   Marital status: Married    Spouse name: Gwyndolyn Saxon   Number of children: 2   Years of education: college   Highest education level: Not on file  Occupational History   Occupation: Advertising account planner. Director    Employer: [employer]  Tobacco Use   Smoking status: Never   Smokeless tobacco: Never  Vaping Use   Vaping Use: Never used  Substance and Sexual Activity   Alcohol use: Yes    Alcohol/week: 2.0 standard drinks    Types: 2 Glasses of wine per week   Drug use: No   Sexual activity: Not on file  Other Topics Concern   Not on file  Social History Narrative   Patient consumes 6-8 cups tea daily, is right handed   Social Determinants of Health   Financial Resource Strain: Not on file  Food Insecurity: Not on file  Transportation Needs: Not on file  Physical Activity: Not on file  Stress: Not on file  Social Connections: Not on file    Review of Systems  Constitutional:  Negative for appetite change and fatigue.  HENT:  Negative for congestion, sinus pressure and sore throat.   Eyes:  Negative for pain and visual disturbance.  Respiratory:  Negative for cough, chest tightness and shortness of breath.   Cardiovascular:  Negative for chest pain, palpitations and leg swelling.  Gastrointestinal:  Negative for abdominal pain, diarrhea, nausea and vomiting.  Genitourinary:  Negative for difficulty urinating and dysuria.  Musculoskeletal:  Negative for joint swelling and myalgias.       Back pain improved - s/p ESI.   Skin:  Negative for color change and rash.  Neurological:   Negative for dizziness, light-headedness and headaches.  Hematological:  Negative for adenopathy. Does not bruise/bleed easily.  Psychiatric/Behavioral:  Negative for agitation and dysphoric mood.       Objective:     BP 122/70   Pulse 78   Temp 97.8 F (36.6 C)   Resp 16   Ht '5\' 7"'  (1.702 m)   Wt 154 lb 12.8 oz (70.2 kg)   SpO2 97%   BMI 24.25 kg/m  Wt Readings from Last 3 Encounters:  04/23/21 154 lb 12.8 oz (70.2 kg)  07/31/20 146 lb (66.2 kg)  01/30/20 155 lb (70.3 kg)    Physical Exam Vitals reviewed.  Constitutional:      General: She is not in acute distress.    Appearance: Normal appearance. She is well-developed.  HENT:     Head: Normocephalic and atraumatic.     Right Ear: External ear normal.     Left Ear: External ear normal.  Eyes:     General: No scleral icterus.       Right eye: No discharge.        Left eye: No discharge.     Conjunctiva/sclera: Conjunctivae normal.  Neck:     Thyroid: No thyromegaly.  Cardiovascular:     Rate and Rhythm: Normal rate and regular rhythm.  Pulmonary:     Effort: No tachypnea, accessory muscle usage or respiratory distress.     Breath sounds: Normal breath sounds. No decreased breath sounds or wheezing.  Chest:  Breasts:    Right: No inverted nipple, mass, nipple discharge or tenderness (no axillary adenopathy).     Left: No inverted nipple, mass, nipple discharge or tenderness (no axilarry adenopathy).  Abdominal:     General: Bowel sounds are normal.     Palpations: Abdomen is soft.     Tenderness: There is no abdominal tenderness.  Musculoskeletal:        General: No swelling or tenderness.     Cervical back: Neck supple.  Lymphadenopathy:     Cervical: No cervical adenopathy.  Skin:    Findings: No erythema or rash.  Neurological:     Mental Status: She is alert and oriented to person, place, and time.  Psychiatric:        Mood and Affect: Mood normal.        Behavior: Behavior normal.    Outpatient  Encounter Medications as of 04/23/2021  Medication Sig   citalopram (CELEXA) 40 MG tablet TAKE 1 TABLET BY MOUTH DAILY.   cyclobenzaprine (FLEXERIL) 5 MG tablet TAKE 1 TABLET BY MOUTH TWICE DAILY   cyclobenzaprine (FLEXERIL) 5 MG tablet Take 1 tablet by mouth 2 times daily   doxycycline (MONODOX) 100 MG capsule TAKE 1 CAPSULE BY MOUTH TWICE DAILY WITH FOOD (INSTEAD OF DOXYCYCLINE 20 MG TABLETS) FOR  ROSACEA FLARES   [EXPIRED] doxycycline (PERIOSTAT) 20 MG tablet Take 1 tablet  by mouth 2 times daily. Take with food   Fluocinolone Acetonide 0.01 % OIL APPLY 2 TO 3 DROPS TO EACH EAR ONCE OR TWICE WEEKLY AS NEEDED FOR ITCHING   hydrocortisone (ANUSOL-HC) 25 MG suppository Place 1 suppository (25 mg total) rectally 2 (two) times daily.   Lifitegrast (XIIDRA) 5 % SOLN Instill 1 drop into both eyes every 12 hours as directed   Lifitegrast 5 % SOLN INSTILL 1 DROP INTO BOTH EYES TWICE A DAY   meloxicam (MOBIC) 7.5 MG tablet Take 7.5 mg by mouth 2 (two) times daily.   Minocycline HCl Micronized (ZILXI) 1.5 % FOAM Apply to affected areas at bedtime as directed   modafinil (PROVIGIL) 100 MG tablet TAKE 1 TABLET (100 MG TOTAL) BY MOUTH DAILY.   Multiple Vitamins-Minerals (MULTIVITAMIN GUMMIES ADULTS PO) Take by mouth. VitaCraves   oxyCODONE (OXY IR/ROXICODONE) 5 MG immediate release tablet Take 1 tablet by mouth every 8 hours as needed   Progesterone Micronized 10 % CREA Apply 49m topically every day.   UNABLE TO FIND 2 (two) times daily as needed. Skin Medicinals(cream):  Azelaic Acid 15%, Ivermectin 1%, Metronidazole 1%   valACYclovir (VALTREX) 1000 MG tablet TAKE 2 TABLETS BY MOUTH EVERY 12 HOURS FOR 1 DAY AT ONSET OF SYMPTOMS.   zolpidem (AMBIEN) 10 MG tablet TAKE 1/2 TO 1 TABLET BY MOUTH AT BEDTIME AS NEEDED FOR SLEEP   zolpidem (AMBIEN) 10 MG tablet TAKE 1/2 TO 1 TABLET BY MOUTH AT BEDTIME AS NEEDED FOR SLEEP   [DISCONTINUED] cyclobenzaprine (FLEXERIL) 5 MG tablet TAKE 1 TABLET BY MOUTH AT BEDTIME AS  NEEDED FOR MUSCLE SPASM. CAN CAUSE DROWSINESS.   [DISCONTINUED] cyclobenzaprine (FLEXERIL) 5 MG tablet TAKE 1 TABLET BY MOUTH TWICE DAILY   [DISCONTINUED] tretinoin (RETIN-A) 0.025 % cream APPLY TOPICALLY AT BEDTIME AS DIRECTED   [DISCONTINUED] XIIDRA 5 % SOLN    No facility-administered encounter medications on file as of 04/23/2021.     Lab Results  Component Value Date   WBC 5.4 04/23/2021   HGB 12.9 04/23/2021   HCT 39.7 04/23/2021   PLT 176.0 04/23/2021   GLUCOSE 73 04/23/2021   CHOL 186 04/23/2021   TRIG 72.0 04/23/2021   HDL 77.10 04/23/2021   LDLCALC 95 04/23/2021   ALT 14 04/23/2021   AST 18 04/23/2021   NA 142 04/23/2021   K 3.7 04/23/2021   CL 104 04/23/2021   CREATININE 0.69 04/23/2021   BUN 11 04/23/2021   CO2 29 04/23/2021   TSH 1.81 04/23/2021   HGBA1C 6.0 04/23/2021       Assessment & Plan:   Problem List Items Addressed This Visit     Anemia    Check cbc today.       Relevant Orders   Basic metabolic panel (Completed)   CBC with Differential/Platelet (Completed)   Hepatic function panel (Completed)   TSH (Completed)   Back pain    S/p ESI.  Improved.       Colon cancer screening    Colonoscopy 2015.  Recommended f/u in 10 years.  IFOB given.       Relevant Orders   Fecal occult blood, imunochemical   Health care maintenance    Physical today 04/13/21.  S/p hysterectomy.  Mammogram 12/05/20 - Birads II.  Colonoscopy 2015.  Recommended f/u in 10 years.        Hyperglycemia    Low carb diet and exercise.  Follow met b and a1c.        Relevant Orders   Hemoglobin A1c (Completed)   Rosacea    Seeing dermatology.  Doxycycline.       Stress    Increased stress.  Discussed.  Does not feel needs any further intervention at this time.  Follow.       Thrombocytopenia (HCC)    Check cbc today to confirm platelet count wnl.       Other Visit Diagnoses     Routine general medical examination at a health care facility    -  Primary    Screening cholesterol level       Relevant Orders   Lipid panel (Completed)        Einar Pheasant, MD

## 2021-04-23 NOTE — Assessment & Plan Note (Signed)
Physical today 04/13/21.  S/p hysterectomy.  Mammogram 12/05/20 - Birads II.  Colonoscopy 2015.  Recommended f/u in 10 years.

## 2021-04-24 LAB — LIPID PANEL
Cholesterol: 186 mg/dL (ref 0–200)
HDL: 77.1 mg/dL (ref >39.00)
LDL Cholesterol: 95 mg/dL (ref 0–99)
NonHDL: 109.05
Total CHOL/HDL Ratio: 2
Triglycerides: 72 mg/dL (ref 0.0–149.0)
VLDL: 14.4 mg/dL (ref 0.0–40.0)

## 2021-04-24 LAB — CBC WITH DIFFERENTIAL/PLATELET
Basophils Absolute: 0 10*3/uL (ref 0.0–0.1)
Basophils Relative: 0.7 % (ref 0.0–3.0)
Eosinophils Absolute: 0.1 10*3/uL (ref 0.0–0.7)
Eosinophils Relative: 1.5 % (ref 0.0–5.0)
HCT: 39.7 % (ref 36.0–46.0)
Hemoglobin: 12.9 g/dL (ref 12.0–15.0)
Lymphocytes Relative: 20 % (ref 12.0–46.0)
Lymphs Abs: 1.1 10*3/uL (ref 0.7–4.0)
MCHC: 32.5 g/dL (ref 30.0–36.0)
MCV: 94.7 fl (ref 78.0–100.0)
Monocytes Absolute: 0.4 10*3/uL (ref 0.1–1.0)
Monocytes Relative: 6.6 % (ref 3.0–12.0)
Neutro Abs: 3.9 10*3/uL (ref 1.4–7.7)
Neutrophils Relative %: 71.2 % (ref 43.0–77.0)
Platelets: 176 10*3/uL (ref 150.0–400.0)
RBC: 4.2 Mil/uL (ref 3.87–5.11)
RDW: 15.4 % (ref 11.5–15.5)
WBC: 5.4 10*3/uL (ref 4.0–10.5)

## 2021-04-24 LAB — HEPATIC FUNCTION PANEL
ALT: 14 U/L (ref 0–35)
AST: 18 U/L (ref 0–37)
Albumin: 4.3 g/dL (ref 3.5–5.2)
Alkaline Phosphatase: 76 U/L (ref 39–117)
Bilirubin, Direct: 0.1 mg/dL (ref 0.0–0.3)
Total Bilirubin: 0.4 mg/dL (ref 0.2–1.2)
Total Protein: 6.7 g/dL (ref 6.0–8.3)

## 2021-04-24 LAB — BASIC METABOLIC PANEL
BUN: 11 mg/dL (ref 6–23)
CO2: 29 mEq/L (ref 19–32)
Calcium: 9 mg/dL (ref 8.4–10.5)
Chloride: 104 mEq/L (ref 96–112)
Creatinine, Ser: 0.69 mg/dL (ref 0.40–1.20)
GFR: 92.53 mL/min (ref >60.00)
Glucose, Bld: 73 mg/dL (ref 70–99)
Potassium: 3.7 mEq/L (ref 3.5–5.1)
Sodium: 142 mEq/L (ref 135–145)

## 2021-04-24 LAB — HEMOGLOBIN A1C: Hgb A1c MFr Bld: 6 % (ref 4.6–6.5)

## 2021-04-24 LAB — TSH: TSH: 1.81 u[IU]/mL (ref 0.35–5.50)

## 2021-04-28 ENCOUNTER — Encounter: Payer: Self-pay | Admitting: Internal Medicine

## 2021-04-28 NOTE — Assessment & Plan Note (Signed)
Check cbc today to confirm platelet count wnl.

## 2021-04-28 NOTE — Assessment & Plan Note (Signed)
S/p ESI.  Improved.

## 2021-04-28 NOTE — Assessment & Plan Note (Signed)
Increased stress.  Discussed.  Does not feel needs any further intervention at this time.  Follow.  

## 2021-04-28 NOTE — Assessment & Plan Note (Signed)
Check cbc today 

## 2021-04-28 NOTE — Assessment & Plan Note (Signed)
Low carb diet and exercise.  Follow met b and a1c.   

## 2021-04-28 NOTE — Assessment & Plan Note (Signed)
Colonoscopy 2015.  Recommended f/u in 10 years.  IFOB given.

## 2021-04-28 NOTE — Assessment & Plan Note (Signed)
Seeing dermatology.  Doxycycline.

## 2021-05-02 ENCOUNTER — Other Ambulatory Visit (HOSPITAL_COMMUNITY): Payer: Self-pay

## 2021-05-03 ENCOUNTER — Other Ambulatory Visit (HOSPITAL_COMMUNITY): Payer: Self-pay

## 2021-05-19 ENCOUNTER — Encounter: Payer: Self-pay | Admitting: Dermatology

## 2021-05-21 DIAGNOSIS — M5136 Other intervertebral disc degeneration, lumbar region: Secondary | ICD-10-CM | POA: Diagnosis not present

## 2021-05-21 DIAGNOSIS — M5416 Radiculopathy, lumbar region: Secondary | ICD-10-CM | POA: Diagnosis not present

## 2021-06-06 ENCOUNTER — Other Ambulatory Visit (HOSPITAL_COMMUNITY): Payer: Self-pay

## 2021-06-07 MED FILL — Fluocinolone Acetonide (Otic) Oil 0.01%: OTIC | Qty: 20 | Fill #0 | Status: CN

## 2021-06-08 ENCOUNTER — Other Ambulatory Visit: Payer: Self-pay

## 2021-06-08 ENCOUNTER — Other Ambulatory Visit (HOSPITAL_COMMUNITY): Payer: Self-pay

## 2021-06-08 MED FILL — Fluocinolone Acetonide (Otic) Oil 0.01%: OTIC | 90 days supply | Qty: 20 | Fill #0 | Status: AC

## 2021-06-24 ENCOUNTER — Other Ambulatory Visit (HOSPITAL_COMMUNITY): Payer: Self-pay

## 2021-06-26 ENCOUNTER — Other Ambulatory Visit: Payer: Self-pay

## 2021-06-26 ENCOUNTER — Ambulatory Visit (INDEPENDENT_AMBULATORY_CARE_PROVIDER_SITE_OTHER): Payer: 59

## 2021-06-26 DIAGNOSIS — Z23 Encounter for immunization: Secondary | ICD-10-CM | POA: Diagnosis not present

## 2021-06-28 ENCOUNTER — Other Ambulatory Visit (INDEPENDENT_AMBULATORY_CARE_PROVIDER_SITE_OTHER): Payer: 59

## 2021-06-28 DIAGNOSIS — Z1211 Encounter for screening for malignant neoplasm of colon: Secondary | ICD-10-CM | POA: Diagnosis not present

## 2021-06-28 LAB — FECAL OCCULT BLOOD, IMMUNOCHEMICAL: Fecal Occult Bld: NEGATIVE

## 2021-07-04 ENCOUNTER — Other Ambulatory Visit: Payer: Self-pay

## 2021-07-04 ENCOUNTER — Encounter: Payer: 59 | Admitting: Dermatology

## 2021-07-10 ENCOUNTER — Other Ambulatory Visit (HOSPITAL_COMMUNITY): Payer: Self-pay

## 2021-07-16 ENCOUNTER — Telehealth (INDEPENDENT_AMBULATORY_CARE_PROVIDER_SITE_OTHER): Payer: No Typology Code available for payment source | Admitting: Adult Health

## 2021-07-16 DIAGNOSIS — R400 Somnolence: Secondary | ICD-10-CM

## 2021-07-16 DIAGNOSIS — F5104 Psychophysiologic insomnia: Secondary | ICD-10-CM

## 2021-07-16 NOTE — Progress Notes (Signed)
PATIENT: Kimberly Figueroa DOB: [birth year]  REASON FOR VISIT: follow up HISTORY FROM: patient  Virtual Visit via Video Note  I connected with Kimberly Figueroa on 07/16/21 at  3:00 PM EST by a video enabled telemedicine application located remotely at [facility] and verified that I am speaking with the correct person using two identifiers who was located at their own home.   I discussed the limitations of evaluation and management by telemedicine and the availability of in person appointments. The patient expressed understanding and agreed to proceed.   PATIENT: Kimberly Figueroa DOB: 1956-02-04  REASON FOR VISIT: follow up HISTORY FROM: patient  HISTORY OF PRESENT ILLNESS: Today 07/16/21:  Kimberly Figueroa is a 65 year old female with a history of insomnia and daytime sleepiness.  She returns today for follow-up.  She continues on Ambien and Provigil.  She states that typically she can take a half a tablet of Ambien and that works well.  She states that her father recently passed and she has been using 1 full tablet of Ambien but plans to reduce her dose in the future.  She does not take Provigil daily.  Continues to only use it if she has to drop early in the morning.  01/22/21: Kimberly Figueroa is a 65 year old female with a history of insomnia and daytime sleepiness.  She returns today for follow-up.  She states that she typically takes a half a tablet of Ambien nightly.  On occasion she does have to take the other half.  She reports on the rare occasion she will have to go ahead and take the full tablet at bedtime.  She states that she has Provigil but probably is only used 3 times in the last month.  Overall she feels that this is working well for her.  She returns today for an evaluation.  HISTORY Kimberly Figueroa is a 65 y.o. female who has been followed in this office for insomnia and daytime sleepiness.  She was initially scheduled for  MyChart video visit however she was unable to hear me so we transferred to a telephone visit.  She states that over the last month her stress levels have increased.  Therefore she has been having a difficult time sleeping and has been using 1 full tablet of Ambien.  She also reports that she has been having low back pain and is seeing pain management.  She states this also disrupts her sleep.  She states that she has Provigil but probably use it 5 to 10 days out of a month.  She typically only uses it when she has early morning meetings.  She returns today for follow-up.    REVIEW OF SYSTEMS: Out of a complete 14 system review of symptoms, the patient complains only of the following symptoms, and all other reviewed systems are negative.  See HPI  ALLERGIES: Allergies  Allergen Reactions   Darvon [Propoxyphene] Nausea Only   Vicodin [Hydrocodone-Acetaminophen] Itching    HOME MEDICATIONS: Outpatient Medications Prior to Visit  Medication Sig Dispense Refill   citalopram (CELEXA) 40 MG tablet TAKE 1 TABLET BY MOUTH DAILY. 90 tablet 1   cyclobenzaprine (FLEXERIL) 5 MG tablet TAKE 1 TABLET BY MOUTH TWICE DAILY 180 tablet 4   cyclobenzaprine (FLEXERIL) 5 MG tablet Take 1 tablet by mouth 2 times daily 180 tablet 3   Fluocinolone Acetonide 0.01 % OIL APPLY 2 TO 3 DROPS TO EACH EAR ONCE OR TWICE WEEKLY AS NEEDED FOR ITCHING 20 mL  3   hydrocortisone (ANUSOL-HC) 25 MG suppository Place 1 suppository (25 mg total) rectally 2 (two) times daily. 14 suppository 0   Lifitegrast (XIIDRA) 5 % SOLN Instill 1 drop into both eyes every 12 hours as directed 180 each 3   Lifitegrast 5 % SOLN INSTILL 1 DROP INTO BOTH EYES TWICE A DAY 180 each 0   meloxicam (MOBIC) 7.5 MG tablet Take 7.5 mg by mouth 2 (two) times daily.     Minocycline HCl Micronized (ZILXI) 1.5 % FOAM Apply to affected areas at bedtime as directed 30 g 2   modafinil (PROVIGIL) 100 MG tablet TAKE 1 TABLET (100 MG TOTAL) BY MOUTH DAILY. 90 tablet 1    Multiple Vitamins-Minerals (MULTIVITAMIN GUMMIES ADULTS PO) Take by mouth. VitaCraves     oxyCODONE (OXY IR/ROXICODONE) 5 MG immediate release tablet Take 1 tablet by mouth every 8 hours as needed 90 tablet 0   Progesterone Micronized 10 % CREA Apply 71ml topically every day. 1 g 1   UNABLE TO FIND 2 (two) times daily as needed. Skin Medicinals(cream):  Azelaic Acid 15%, Ivermectin 1%, Metronidazole 1%     zolpidem (AMBIEN) 10 MG tablet TAKE 1/2 TO 1 TABLET BY MOUTH AT BEDTIME AS NEEDED FOR SLEEP 90 tablet 1   zolpidem (AMBIEN) 10 MG tablet TAKE 1/2 TO 1 TABLET BY MOUTH AT BEDTIME AS NEEDED FOR SLEEP 90 tablet 1   No facility-administered medications prior to visit.    PAST MEDICAL HISTORY: Past Medical History:  Diagnosis Date   Endometriosis    History of frequent urinary tract infections    s/p bladder dilatation (11/05)   Hypersomnia, recurrent 06/28/2014   ITP (idiopathic thrombocytopenic purpura)    Pneumonia    Rosacea     PAST SURGICAL HISTORY: Past Surgical History:  Procedure Laterality Date   bladder dilatation  11/05   CATARACT EXTRACTION W/PHACO Right 12/13/2019   Procedure: CATARACT EXTRACTION PHACO AND INTRAOCULAR LENS PLACEMENT ([place]) RIGHT VIVITY TORIC LENS 4.35 00:30.2;  Surgeon: Birder Robson, MD;  Location: [place];  Service: Ophthalmology;  Laterality: Right;   CATARACT EXTRACTION W/PHACO Left 01/10/2020   Procedure: CATARACT EXTRACTION PHACO AND INTRAOCULAR LENS PLACEMENT ([facility]) LEFT VIVITY TORIC LENS 6.19  00:32.8;  Surgeon: Birder Robson, MD;  Location: [place];  Service: Ophthalmology;  Laterality: Left;   CESAREAN SECTION  1993   COLONOSCOPY     laproscopic surgery  10/06   found to have endometriosis   SHOULDER SURGERY Left 2008   [place]  02/07    FAMILY HISTORY: Family History  Problem Relation Age of Onset   Alcohol abuse Mother    Varicose Veins Mother     Diabetes Father    Hearing loss Father    Heart disease Father    Crohn's disease Father    Crohn's disease Paternal Grandfather    Breast cancer Neg Hx    Colon cancer Neg Hx     SOCIAL HISTORY: Social History   Socioeconomic History   Marital status: Married    Spouse name: Gwyndolyn Saxon   Number of children: 2   Years of education: college   Highest education level: Not on file  Occupational History   Occupation: Advertising account planner. Director    Employer: [employer]  Tobacco Use   Smoking status: Never   Smokeless tobacco: Never  Vaping Use   Vaping Use: Never used  Substance and Sexual Activity  Alcohol use: Yes    Alcohol/week: 2.0 standard drinks    Types: 2 Glasses of wine per week   Drug use: No   Sexual activity: Not on file  Other Topics Concern   Not on file  Social History Narrative   Patient consumes 6-8 cups tea daily, is right handed   Social Determinants of Health   Financial Resource Strain: Not on file  Food Insecurity: Not on file  Transportation Needs: Not on file  Physical Activity: Not on file  Stress: Not on file  Social Connections: Not on file  Intimate Partner Violence: Not on file      PHYSICAL EXAM Generalized: Well developed, in no acute distress   Neurological examination  Mentation: Alert oriented to time, place, history taking. Follows all commands speech and language fluent Cranial nerve II-XII:. Facial symmetry noted.  Reflexes: UTA  DIAGNOSTIC DATA (LABS, IMAGING, TESTING) - I reviewed patient records, labs, notes, testing and imaging myself where available.  Lab Results  Component Value Date   WBC 5.4 04/23/2021   HGB 12.9 04/23/2021   HCT 39.7 04/23/2021   MCV 94.7 04/23/2021   PLT 176.0 04/23/2021      Component Value Date/Time   NA 142 04/23/2021 1433   K 3.7 04/23/2021 1433   CL 104 04/23/2021 1433   CO2 29 04/23/2021 1433   GLUCOSE 73 04/23/2021 1433   BUN 11 04/23/2021 1433   CREATININE 0.69 04/23/2021 1433    CALCIUM 9.0 04/23/2021 1433   PROT 6.7 04/23/2021 1433   ALBUMIN 4.3 04/23/2021 1433   AST 18 04/23/2021 1433   ALT 14 04/23/2021 1433   ALKPHOS 76 04/23/2021 1433   BILITOT 0.4 04/23/2021 1433   Lab Results  Component Value Date   CHOL 186 04/23/2021   HDL 77.10 04/23/2021   LDLCALC 95 04/23/2021   TRIG 72.0 04/23/2021   CHOLHDL 2 04/23/2021   Lab Results  Component Value Date   HGBA1C 6.0 04/23/2021   No results found for: VITAMINB12 Lab Results  Component Value Date   TSH 1.81 04/23/2021      ASSESSMENT AND PLAN 65 y.o. year old female  has a past medical history of Endometriosis, History of frequent urinary tract infections, Hypersomnia, recurrent (06/28/2014), ITP (idiopathic thrombocytopenic purpura), Pneumonia, and Rosacea. here with:  1.  Insomnia 2.  Daytime sleepiness  --Continue Ambien 10 mg half a tablet to 1 tablet at bedtime as needed -- Continue Provigil 100 mg 1 tablet daily if needed -- Advised if symptoms worsen or she develops new symptoms they should let us know -- Follow-up in 6 months or sooner if needed   . Ward Givens, MSN, NP-C 07/16/2021, 3:07 PM [facility] [address], [place], [place] [address] [phone]

## 2021-07-21 ENCOUNTER — Encounter: Payer: Self-pay | Admitting: Internal Medicine

## 2021-07-22 ENCOUNTER — Other Ambulatory Visit (HOSPITAL_COMMUNITY): Payer: Self-pay

## 2021-07-22 ENCOUNTER — Other Ambulatory Visit: Payer: Self-pay | Admitting: Adult Health

## 2021-07-22 ENCOUNTER — Other Ambulatory Visit: Payer: Self-pay | Admitting: Dermatology

## 2021-07-22 DIAGNOSIS — L719 Rosacea, unspecified: Secondary | ICD-10-CM

## 2021-07-22 MED ORDER — DOXYCYCLINE HYCLATE 20 MG PO TABS
20.0000 mg | ORAL_TABLET | Freq: Two times a day (BID) | ORAL | 2 refills | Status: AC
  Filled 2021-07-22: qty 60, 30d supply, fill #0
  Filled 2021-07-29 – 2021-08-10 (×2): qty 60, 30d supply, fill #1

## 2021-07-24 ENCOUNTER — Other Ambulatory Visit (HOSPITAL_COMMUNITY): Payer: Self-pay

## 2021-07-24 MED ORDER — MODAFINIL 100 MG PO TABS
ORAL_TABLET | Freq: Every day | ORAL | 1 refills | Status: DC
  Filled 2021-07-24: qty 90, 90d supply, fill #0
  Filled 2022-01-14: qty 90, 90d supply, fill #1

## 2021-07-25 ENCOUNTER — Encounter: Payer: Self-pay | Admitting: Internal Medicine

## 2021-07-25 ENCOUNTER — Telehealth (INDEPENDENT_AMBULATORY_CARE_PROVIDER_SITE_OTHER): Payer: 59 | Admitting: Internal Medicine

## 2021-07-25 ENCOUNTER — Other Ambulatory Visit (HOSPITAL_COMMUNITY): Payer: Self-pay

## 2021-07-25 DIAGNOSIS — D696 Thrombocytopenia, unspecified: Secondary | ICD-10-CM

## 2021-07-25 DIAGNOSIS — R739 Hyperglycemia, unspecified: Secondary | ICD-10-CM | POA: Diagnosis not present

## 2021-07-25 DIAGNOSIS — M545 Low back pain, unspecified: Secondary | ICD-10-CM | POA: Diagnosis not present

## 2021-07-25 DIAGNOSIS — F439 Reaction to severe stress, unspecified: Secondary | ICD-10-CM | POA: Diagnosis not present

## 2021-07-25 DIAGNOSIS — R400 Somnolence: Secondary | ICD-10-CM | POA: Diagnosis not present

## 2021-07-25 DIAGNOSIS — D638 Anemia in other chronic diseases classified elsewhere: Secondary | ICD-10-CM

## 2021-07-25 DIAGNOSIS — Z1211 Encounter for screening for malignant neoplasm of colon: Secondary | ICD-10-CM

## 2021-07-25 DIAGNOSIS — F5104 Psychophysiologic insomnia: Secondary | ICD-10-CM

## 2021-07-25 NOTE — Progress Notes (Addendum)
Patient ID: Kimberly Figueroa, female   DOB: December 12, 1955, 65 y.o.   MRN: [id number]   Virtual Visit via video Note  This visit type was conducted due to national recommendations for restrictions regarding the COVID-19 pandemic (e.g. social distancing).  This format is felt to be most appropriate for this patient at this time.  All issues noted in this document were discussed and addressed.  No physical exam was performed (except for noted visual exam findings with Video Visits).   I connected with Kimberly Figueroa by a video enabled telemedicine application and verified that I am speaking with the correct person using two identifiers. Location patient: home Location provider: work Persons participating in the virtual visit: patient, provider  The limitations, risks, security and privacy concerns of performing an evaluation and management service by video and the availability of in person appointments have been discussed.  It has also been discussed with the patient that there may be a patient responsible charge related to this service. The patient expressed understanding and agreed to proceed.  Initially connected - visual and auditory. Completed visit - auditory only.  She could see me, but I could not see her throughout the visit.    Reason for visit: follow up appt  HPI: History of insomnia and daytime sleepiness.  Continues on [place] and provigil.  Increased stress.  Father recently passed.  Discussed.  Overall feels she is handling things relatively well.  No chest pain or sob reported.  No abdominal pain or bowel change reported.  Back doing better.     ROS: See pertinent positives and negatives per HPI.  Past Medical History:  Diagnosis Date   Endometriosis    History of frequent urinary tract infections    s/p bladder dilatation (11/05)   Hypersomnia, recurrent 06/28/2014   ITP (idiopathic thrombocytopenic purpura)    Pneumonia    Rosacea     Past Surgical History:   Procedure Laterality Date   bladder dilatation  11/05   CATARACT EXTRACTION W/PHACO Right 12/13/2019   Procedure: CATARACT EXTRACTION PHACO AND INTRAOCULAR LENS PLACEMENT ([place]) RIGHT VIVITY TORIC LENS 4.35 00:30.2;  Surgeon: Birder Robson, MD;  Location: [place];  Service: Ophthalmology;  Laterality: Right;   CATARACT EXTRACTION W/PHACO Left 01/10/2020   Procedure: CATARACT EXTRACTION PHACO AND INTRAOCULAR LENS PLACEMENT ([facility]) LEFT VIVITY TORIC LENS 6.19  00:32.8;  Surgeon: Birder Robson, MD;  Location: [place];  Service: Ophthalmology;  Laterality: Left;   CESAREAN SECTION  1993   COLONOSCOPY     laproscopic surgery  10/06   found to have endometriosis   SHOULDER SURGERY Left 2008   [place]  02/07    Family History  Problem Relation Age of Onset   Alcohol abuse Mother    Varicose Veins Mother    Diabetes Father    Hearing loss Father    Heart disease Father    Crohn's disease Father    Crohn's disease Paternal Grandfather    Breast cancer Neg Hx    Colon cancer Neg Hx     SOCIAL HX: reviewed.    Current Outpatient Medications:    citalopram (CELEXA) 40 MG tablet, TAKE 1 TABLET BY MOUTH DAILY., Disp: 90 tablet, Rfl: 1   cyclobenzaprine (FLEXERIL) 5 MG tablet, Take 1 tablet by mouth 2 times daily, Disp: 180 tablet, Rfl: 3   doxycycline (PERIOSTAT) 20 MG tablet, Take 1 tablet  by mouth 2 times  daily. Take with food, Disp: 60 tablet, Rfl: 2   Fluocinolone Acetonide 0.01 % OIL, APPLY 2 TO 3 DROPS TO EACH EAR ONCE OR TWICE WEEKLY AS NEEDED FOR ITCHING, Disp: 20 mL, Rfl: 3   Lifitegrast (XIIDRA) 5 % SOLN, Instill 1 drop into both eyes every 12 hours as directed, Disp: 180 each, Rfl: 3   Minocycline HCl Micronized (ZILXI) 1.5 % FOAM, Apply to affected areas at bedtime as directed, Disp: 30 g, Rfl: 2   modafinil (PROVIGIL) 100 MG tablet, TAKE 1 TABLET (100 MG TOTAL) BY MOUTH DAILY., Disp: 90  tablet, Rfl: 1   Multiple Vitamins-Minerals (MULTIVITAMIN GUMMIES ADULTS PO), Take by mouth. VitaCraves, Disp: , Rfl:    oxyCODONE (OXY IR/ROXICODONE) 5 MG immediate release tablet, Take 1 tablet by mouth every 8 hours as needed, Disp: 90 tablet, Rfl: 0   Progesterone Micronized 10 % CREA, Apply 60m topically every day., Disp: 1 g, Rfl: 1   UNABLE TO FIND, 2 (two) times daily as needed. Skin Medicinals(cream):  Azelaic Acid 15%, Ivermectin 1%, Metronidazole 1%, Disp: , Rfl:    zolpidem (AMBIEN) 10 MG tablet, TAKE 1/2 TO 1 TABLET BY MOUTH AT BEDTIME AS NEEDED FOR SLEEP, Disp: 90 tablet, Rfl: 1  EXAM:  GENERAL: alert, oriented. Sounds to be in no acute distress.    PSYCH/NEURO: pleasant and cooperative, no obvious depression or anxiety, speech and thought processing grossly intact  ASSESSMENT AND PLAN:  Discussed the following assessment and plan:  Problem List Items Addressed This Visit     Anemia    Follow cbc.       Back pain    S/p ESI. Improved.       Chronic insomnia    Followed by neurology.  Stable.       Colon cancer screening    Colonoscopy 2015.  Recommended f/u in 10 years.  IFOB 06/28/21 - negative.        Daytime somnolence    Followed by neurology.       Hyperglycemia    Low carb diet and exercise.  Follow met b and a1c.        Stress    Increased stress.  Discussed.  Does not feel needs any further intervention at this time.  Follow.       [place]    Follow cbc today to confirm platelet count wnl.        Return in about 3 months (around 10/23/2021) for follow up appt (378m).   I discussed the assessment and treatment plan with the patient. The patient was provided an opportunity to ask questions and all were answered. The patient agreed with the plan and demonstrated an understanding of the instructions.   The patient was advised to call back or seek an in-person evaluation if the symptoms worsen or if the condition fails to improve  as anticipated.  I provided 22 minutes of non-face-to-face time during this encounter.   ChEinar PheasantMD

## 2021-07-28 ENCOUNTER — Encounter: Payer: Self-pay | Admitting: Internal Medicine

## 2021-07-28 NOTE — Assessment & Plan Note (Signed)
Colonoscopy 2015.  Recommended f/u in 10 years.  IFOB 06/28/21 - negative.

## 2021-07-28 NOTE — Assessment & Plan Note (Signed)
Low carb diet and exercise.  Follow met b and a1c.   

## 2021-07-28 NOTE — Assessment & Plan Note (Signed)
Follow cbc today to confirm platelet count wnl.

## 2021-07-28 NOTE — Assessment & Plan Note (Signed)
S/p ESI. Improved.

## 2021-07-28 NOTE — Assessment & Plan Note (Signed)
Increased stress.  Discussed.  Does not feel needs any further intervention at this time.  Follow.  

## 2021-07-28 NOTE — Assessment & Plan Note (Signed)
Followed by neurology.   

## 2021-07-28 NOTE — Assessment & Plan Note (Signed)
Followed by neurology.  Stable  

## 2021-07-28 NOTE — Assessment & Plan Note (Signed)
Follow cbc.  

## 2021-07-29 ENCOUNTER — Other Ambulatory Visit (HOSPITAL_COMMUNITY): Payer: Self-pay

## 2021-07-30 DIAGNOSIS — M5416 Radiculopathy, lumbar region: Secondary | ICD-10-CM | POA: Diagnosis not present

## 2021-08-10 ENCOUNTER — Encounter: Payer: Self-pay | Admitting: Internal Medicine

## 2021-08-12 ENCOUNTER — Other Ambulatory Visit (HOSPITAL_COMMUNITY): Payer: Self-pay

## 2021-08-12 NOTE — Telephone Encounter (Signed)
Confirm she has been using regularly.  This is the rx that they have to send to [place] (paper copy - I believe).

## 2021-08-15 ENCOUNTER — Other Ambulatory Visit: Payer: Self-pay

## 2021-08-15 ENCOUNTER — Other Ambulatory Visit (HOSPITAL_COMMUNITY): Payer: Self-pay

## 2021-08-15 NOTE — Telephone Encounter (Signed)
Verbal given for refill. Patient is aware.

## 2021-08-15 NOTE — Telephone Encounter (Signed)
Patient called and would like to know why this has not been filled. Please use [facility], phone [phone].

## 2021-08-15 NOTE — Telephone Encounter (Signed)
LM for pharmacy to call so I can give verbal

## 2021-08-21 ENCOUNTER — Ambulatory Visit (INDEPENDENT_AMBULATORY_CARE_PROVIDER_SITE_OTHER): Payer: 59 | Admitting: Dermatology

## 2021-08-21 ENCOUNTER — Other Ambulatory Visit: Payer: Self-pay

## 2021-08-21 ENCOUNTER — Other Ambulatory Visit (HOSPITAL_COMMUNITY): Payer: Self-pay

## 2021-08-21 DIAGNOSIS — L821 Other seborrheic keratosis: Secondary | ICD-10-CM | POA: Diagnosis not present

## 2021-08-21 DIAGNOSIS — L719 Rosacea, unspecified: Secondary | ICD-10-CM

## 2021-08-21 DIAGNOSIS — Z1283 Encounter for screening for malignant neoplasm of skin: Secondary | ICD-10-CM | POA: Diagnosis not present

## 2021-08-21 DIAGNOSIS — L578 Other skin changes due to chronic exposure to nonionizing radiation: Secondary | ICD-10-CM | POA: Diagnosis not present

## 2021-08-21 DIAGNOSIS — L609 Nail disorder, unspecified: Secondary | ICD-10-CM

## 2021-08-21 DIAGNOSIS — L570 Actinic keratosis: Secondary | ICD-10-CM

## 2021-08-21 DIAGNOSIS — B351 Tinea unguium: Secondary | ICD-10-CM | POA: Diagnosis not present

## 2021-08-21 DIAGNOSIS — D485 Neoplasm of uncertain behavior of skin: Secondary | ICD-10-CM

## 2021-08-21 DIAGNOSIS — L814 Other melanin hyperpigmentation: Secondary | ICD-10-CM | POA: Diagnosis not present

## 2021-08-21 DIAGNOSIS — L700 Acne vulgaris: Secondary | ICD-10-CM | POA: Diagnosis not present

## 2021-08-21 DIAGNOSIS — L308 Other specified dermatitis: Secondary | ICD-10-CM

## 2021-08-21 DIAGNOSIS — D229 Melanocytic nevi, unspecified: Secondary | ICD-10-CM

## 2021-08-21 DIAGNOSIS — D1801 Hemangioma of skin and subcutaneous tissue: Secondary | ICD-10-CM

## 2021-08-21 HISTORY — DX: Actinic keratosis: L57.0

## 2021-08-21 MED ORDER — TRETINOIN 0.025 % EX CREA: TOPICAL_CREAM | CUTANEOUS | 3 refills | Status: DC

## 2021-08-21 MED ORDER — DOXYCYCLINE MONOHYDRATE 100 MG PO TABS
100.0000 mg | ORAL_TABLET | Freq: Two times a day (BID) | ORAL | 2 refills | Status: DC
  Filled 2021-08-21: qty 30, 15d supply, fill #0

## 2021-08-21 MED ORDER — ZILXI 1.5 % EX FOAM: CUTANEOUS | 2 refills | Status: DC

## 2021-08-21 MED ORDER — DOXYCYCLINE 40 MG PO CPDR: 40.0000 mg | DELAYED_RELEASE_CAPSULE | Freq: Every day | ORAL | 3 refills | Status: DC

## 2021-08-21 MED ORDER — MUPIROCIN 2 % EX OINT
TOPICAL_OINTMENT | CUTANEOUS | 0 refills | Status: DC
  Filled 2021-08-21: qty 22, 10d supply, fill #0

## 2021-08-21 NOTE — Progress Notes (Signed)
Follow-Up Visit   Subjective  Kimberly Figueroa is a 65 y.o. female who presents for the following: Annual Exam (Patient has scaly patches that flake off and leave residual marks that she would like checked today. She has no hx of skin cancer or  dysplastic nevi. ), Rosacea (Patient currently using Doxycycline 20mg  BID, Zilxi foam QD, and Skin Medicinals mix QD.), and Acne (Patient currently using Tretinoin ).  The following portions of the chart were reviewed this encounter and updated as appropriate:   Tobacco   Allergies   Meds   Problems   Med Hx   Surg Hx   Fam Hx       Review of Systems:  No other skin or systemic complaints except as noted in HPI or Assessment and Plan.  Objective  Well appearing patient in no apparent distress; mood and affect are within normal limits.  A full examination was performed including scalp, head, eyes, ears, nose, lips, neck, chest, axillae, abdomen, back, buttocks, bilateral upper extremities, bilateral lower extremities, hands, feet, fingers, toes, fingernails, and toenails. All findings within normal limits unless otherwise noted below.  Face Mild mid face erythema with rare inflammatory papule.   Face Rare open comedone and inflammatory papule at face  L med calf 0.8 cm thin scaly red papule.      Right Popliteal Fossa x 1 Erythematous thin papules/macules with gritty scale.   B/L toenails Nail thickened with subungual debris    Assessment & Plan  Rosacea Face  Chronic condition with duration over one year. Condition is bothersome to patient. Not currently at goal.  Rosacea is a chronic progressive skin condition usually affecting the face of adults, causing redness and/or acne bumps. It is treatable but not curable. It sometimes affects the eyes (ocular rosacea) as well. It may respond to topical and/or systemic medication and can flare with stress, sun exposure, alcohol, exercise and some foods.  Daily application of broad  spectrum spf 30+ sunscreen to face is recommended to reduce flares.  Continue Doxycycline. However will try to switch to Oracea 40mg  po QD if covered. If not covered or too expensive continue Doxycycline 20mg  po BID. Doxycycline should be taken with food to prevent nausea. Do not lay down for 30 minutes after taking. Be cautious with sun exposure and use good sun protection while on this medication. Pregnant women should not take this medication.   Continue Zilxi foam QD and Skin Medicinals ivermectin/azelaic acid/metronidazole mix QD.   Start Doxycycline Monohydrate 100mg  po BID for a few days PRN flares. Patient advised to hold lower dose doxycycline/oracea while taking the higher dose capsules.   doxycycline (ORACEA) 40 MG capsule - Face Take 1 capsule (40 mg total) by mouth daily. Take with food.  doxycycline (ADOXA) 100 MG tablet - Face Take 1 tablet (100 mg total) by mouth 2 (two) times daily as needed for rosacea flares. Take with food.  Related Medications doxycycline (PERIOSTAT) 20 MG tablet Take 1 tablet by mouth 2 times daily with food  Minocycline HCl Micronized (ZILXI) 1.5 % FOAM Apply a thin layer to the face QHS.  Acne vulgaris Face  Chronic condition with duration or expected duration over one year. Currently well-controlled.  Continue Tretinoin 0.025% cream QHS.   Topical retinoid medications like tretinoin/Retin-A, adapalene/Differin, tazarotene/Fabior, and Epiduo/Epiduo Forte can cause dryness and irritation when first started. Only apply a pea-sized amount to the entire affected area. Avoid applying it around the eyes, edges of mouth and creases  at the nose. If you experience irritation, use a good moisturizer first and/or apply the medicine less often. If you are doing well with the medicine, you can increase how often you use it until you are applying every night. Be careful with sun protection while using this medication as it can make you sensitive to the sun. This  medicine should not be used by pregnant women.    tretinoin (RETIN-A) 0.025 % cream - Face Apply a pea sized amount to the entire face QHS.  Neoplasm of uncertain behavior of skin L med calf  Skin / nail biopsy Type of biopsy: tangential   Informed consent: discussed and consent obtained   Timeout: patient name, date of birth, surgical site, and procedure verified   Procedure prep:  Patient was prepped and draped in usual sterile fashion Prep type:  Isopropyl alcohol Anesthesia: the lesion was anesthetized in a standard fashion   Anesthetic:  1% lidocaine w/ epinephrine 1-100,000 buffered w/ 8.4% NaHCO3 Instrument used: flexible razor blade   Hemostasis achieved with: pressure, aluminum chloride and electrodesiccation   Outcome: patient tolerated procedure well   Post-procedure details: sterile dressing applied and wound care instructions given   Dressing type: bandage and petrolatum    mupirocin ointment (BACTROBAN) 2 % Apply to wound daily as directed until healed.  Specimen 1 - Surgical pathology Differential Diagnosis: D48.5 r/o early SCC vs other  Check Margins: No  Start Mupirocin 2% ointment to aa QD until healed.   AK (actinic keratosis) Right Popliteal Fossa x 1  Prior to procedure, discussed risks of blister formation, small wound, skin dyspigmentation, or rare scar following cryotherapy. Recommend Vaseline ointment to treated areas while healing.  Actinic keratoses are precancerous spots that appear secondary to cumulative UV radiation exposure/sun exposure over time. They are chronic with expected duration over 1 year. A portion of actinic keratoses will progress to squamous cell carcinoma of the skin. It is not possible to reliably predict which spots will progress to skin cancer and so treatment is recommended to prevent development of skin cancer.  Recommend daily broad spectrum sunscreen SPF 30+ to sun-exposed areas, reapply every 2 hours as needed.  Recommend  staying in the shade or wearing long sleeves, sun glasses (UVA+UVB protection) and wide brim hats (4-inch brim around the entire circumference of the hat). Call for new or changing lesions.   Destruction of lesion - Right Popliteal Fossa x 1 Complexity: simple   Destruction method: cryotherapy   Informed consent: discussed and consent obtained   Timeout:  patient name, date of birth, surgical site, and procedure verified Lesion destroyed using liquid nitrogen: Yes   Region frozen until ice ball extended beyond lesion: Yes   Outcome: patient tolerated procedure well with no complications   Post-procedure details: wound care instructions given    Nail problem B/L toenails  Favor onychomycosis  Discussed treatment options of oral Lamisil pulse dosing (if culture + of L great toenail) vs topical treatment vs laser treatment at podiatrist.   Plan tx pending fungal culture results.   Culture, Fungus with Smear - B/L toenails   Lentigines - Scattered tan macules - Due to sun exposure - Benign-appearing, observe - Recommend daily broad spectrum sunscreen SPF 30+ to sun-exposed areas, reapply every 2 hours as needed. - Call for any changes  Seborrheic Keratoses - Stuck-on, waxy, tan-brown papules and/or plaques  - Benign-appearing - Discussed benign etiology and prognosis. - Observe - Call for any changes  Melanocytic Nevi - Tan-brown  and/or pink-flesh-colored symmetric macules and papules - Benign appearing on exam today - Observation - Call clinic for new or changing moles - Recommend daily use of broad spectrum spf 30+ sunscreen to sun-exposed areas.   Hemangiomas - Red papules - Discussed benign nature - Observe - Call for any changes  Actinic Damage - Chronic condition, secondary to cumulative UV/sun exposure - diffuse scaly erythematous macules with underlying dyspigmentation - Recommend daily broad spectrum sunscreen SPF 30+ to sun-exposed areas, reapply every 2  hours as needed.  - Staying in the shade or wearing long sleeves, sun glasses (UVA+UVB protection) and wide brim hats (4-inch brim around the entire circumference of the hat) are also recommended for sun protection.  - Call for new or changing lesions.  Skin cancer screening performed today.  Return in about 1 year (around 08/21/2022) for TBSE.  Luther Redo, CMA, am acting as scribe for Forest Gleason, MD .  Documentation: I have reviewed the above documentation for accuracy and completeness, and I agree with the above.  Forest Gleason, MD

## 2021-08-21 NOTE — Patient Instructions (Addendum)
Doxycycline should be taken with food to prevent nausea. Do not lay down for 30 minutes after taking. Be cautious with sun exposure and use good sun protection while on this medication. Pregnant women should not take this medication.   Wound Care Instructions  Cleanse wound gently with soap and water once a day then pat dry with clean gauze. Apply a thing coat of Petrolatum (petroleum jelly, "Vaseline") over the wound (unless you have an allergy to this). We recommend that you use a new, sterile tube of Vaseline. Do not pick or remove scabs. Do not remove the yellow or white "healing tissue" from the base of the wound.  Cover the wound with fresh, clean, nonstick gauze and secure with paper tape. You may use Band-Aids in place of gauze and tape if the would is small enough, but would recommend trimming much of the tape off as there is often too much. Sometimes Band-Aids can irritate the skin.  You should call the office for your biopsy report after 1 week if you have not already been contacted.  If you experience any problems, such as abnormal amounts of bleeding, swelling, significant bruising, significant pain, or evidence of infection, please call the office immediately.  FOR ADULT SURGERY PATIENTS: If you need something for pain relief you may take 1 extra strength Tylenol (acetaminophen) AND 2 Ibuprofen (200mg  each) together every 4 hours as needed for pain. (do not take these if you are allergic to them or if you have a reason you should not take them.) Typically, you may only need pain medication for 1 to 3 days.      If You Need Anything After Your Visit  If you have any questions or concerns for your doctor, please call our main line at [phone] and press option 4 to reach your doctor's medical assistant. If no one answers, please leave a voicemail as directed and we will return your call as soon as possible. Messages left after 4 pm will be answered the following business day.    You may also send [place] a message via [place]. We typically respond to MyChart messages within 1-2 business days.  For prescription refills, please ask your pharmacy to contact our office. Our fax number is [phone].  If you have an urgent issue when the clinic is closed that cannot wait until the next business day, you can page your doctor at the number below.    Please note that while we do our best to be available for urgent issues outside of office hours, we are not available 24/7.   If you have an urgent issue and are unable to reach [place], you may choose to seek medical care at your doctor's office, retail clinic, urgent care center, or emergency room.  If you have a medical emergency, please immediately call 911 or go to the emergency department.  Pager Numbers  - Dr. Nehemiah Massed: [phone]  - Dr. Laurence Ferrari: [phone]  - Dr. Nicole Kindred: [phone]  In the event of inclement weather, please call our main line at [phone] for an update on the status of any delays or closures.  Dermatology Medication Tips: Please keep the boxes that topical medications come in in order to help keep track of the instructions about where and how to use these. Pharmacies typically print the medication instructions only on the boxes and not directly on the medication tubes.   If your medication is too expensive, please contact our office at [phone] option 4 or send [place] a  message through [place].   We are unable to tell what your co-pay for medications will be in advance as this is different depending on your insurance coverage. However, we may be able to find a substitute medication at lower cost or fill out paperwork to get insurance to cover a needed medication.   If a prior authorization is required to get your medication covered by your insurance company, please allow [place] 1-2 business days to complete this process.  Drug prices often vary depending on where the prescription is filled and some  pharmacies may offer cheaper prices.  The website www.goodrx.com contains coupons for medications through different pharmacies. The prices here do not account for what the cost may be with help from insurance (it may be cheaper with your insurance), but the website can give you the price if you did not use any insurance.  - You can print the associated coupon and take it with your prescription to the pharmacy.  - You may also stop by our office during regular business hours and pick up a GoodRx coupon card.  - If you need your prescription sent electronically to a different pharmacy, notify our office through [facility] or by phone at [phone] option 4.     Si Usted Necesita Algo Despus de Su Visita  Tambin puede enviarnos un mensaje a travs de Pharmacist, community. Por lo general respondemos a los mensajes de MyChart en el transcurso de 1 a 2 das hbiles.  Para renovar recetas, por favor pida a su farmacia que se ponga en contacto con nuestra oficina. Harland Dingwall de fax es [place] [phone].  Si tiene un asunto urgente cuando la clnica est cerrada y que no puede esperar hasta el siguiente da hbil, puede llamar/localizar a su doctor(a) al nmero que aparece a continuacin.   Por favor, tenga en cuenta que aunque hacemos todo lo posible para estar disponibles para asuntos urgentes fuera del horario de [place], no estamos disponibles las 24 horas del da, los 7 das de la [place].   Si tiene un problema urgente y no puede comunicarse con nosotros, puede optar por buscar atencin mdica  en el consultorio de su doctor(a), en una clnica privada, en un centro de atencin urgente o en una sala de emergencias.  Si tiene Engineering geologist, por favor llame inmediatamente al 911 o vaya a la sala de emergencias.  Nmeros de bper  - Dr. Nehemiah Massed: [phone]  - Dra. Moye: [phone]  - Dra. Nicole Kindred: [phone]  En caso de inclemencias del [place], por favor llame a Johnsie Kindred  principal al [phone] para una actualizacin sobre el [place] de cualquier retraso o cierre.  Consejos para la medicacin en dermatologa: Por favor, guarde las cajas en las que vienen los medicamentos de uso tpico para ayudarle a seguir las instrucciones sobre dnde y cmo usarlos. Las farmacias generalmente imprimen las instrucciones del medicamento slo en las cajas y no directamente en los tubos del [place].   Si su medicamento es muy caro, por favor, pngase en contacto con Zigmund Daniel llamando al [phone] y presione la opcin 4 o envenos un mensaje a travs de Pharmacist, community.   No podemos decirle cul ser su copago por los medicamentos por adelantado ya que esto es diferente dependiendo de la cobertura de su seguro. Sin embargo, es posible que podamos encontrar un medicamento sustituto a Electrical engineer un formulario para que el seguro cubra el medicamento que se considera necesario.   Si se requiere [employer]  autorizacin previa para que su compaa de seguros [place] su medicamento, por favor permtanos de 1 a 2 das hbiles para completar este proceso.  Los precios de los medicamentos varan con frecuencia dependiendo del Environmental consultant de dnde se surte la receta y alguna farmacias pueden ofrecer precios ms baratos.  El sitio web www.goodrx.com tiene cupones para medicamentos de Airline pilot. Los precios aqu no tienen en cuenta lo que podra costar con la ayuda del seguro (puede ser ms barato con su seguro), pero el sitio web puede darle el precio si no utiliz Research scientist (physical sciences).  - Puede imprimir el cupn correspondiente y llevarlo con su receta a la farmacia.  - Tambin puede pasar por nuestra oficina durante el horario de atencin regular y Charity fundraiser una tarjeta de cupones de GoodRx.  - Si necesita que su receta se enve electrnicamente a una farmacia diferente, informe a nuestra oficina a travs de MyChart de [place] o por telfono llamando al [phone] y presione la opcin  4.

## 2021-08-22 ENCOUNTER — Encounter: Payer: Self-pay | Admitting: Dermatology

## 2021-08-22 ENCOUNTER — Telehealth: Payer: Self-pay

## 2021-08-22 NOTE — Telephone Encounter (Signed)
Patient informed of pathology results and appointment scheduled.  °

## 2021-08-22 NOTE — Telephone Encounter (Signed)
-----   Message from [place], MD sent at 08/22/2021  3:51 PM EST ----- Skin , left med calf ACTINIC KERATOSIS WITH SUPERIMPOSED SPONGIOTIC DERMATITIS --> "precancer spot" --> recommend LN2 in clinic in next 3 months  MAs please call. Thank you!

## 2021-09-14 LAB — FUNGUS CULTURE W SMEAR

## 2021-09-17 ENCOUNTER — Other Ambulatory Visit: Payer: Self-pay | Admitting: Internal Medicine

## 2021-09-17 ENCOUNTER — Other Ambulatory Visit (HOSPITAL_COMMUNITY): Payer: Self-pay

## 2021-09-17 ENCOUNTER — Ambulatory Visit (INDEPENDENT_AMBULATORY_CARE_PROVIDER_SITE_OTHER): Payer: 59 | Admitting: Dermatology

## 2021-09-17 ENCOUNTER — Encounter: Payer: Self-pay | Admitting: Dermatology

## 2021-09-17 ENCOUNTER — Other Ambulatory Visit: Payer: Self-pay

## 2021-09-17 DIAGNOSIS — B351 Tinea unguium: Secondary | ICD-10-CM

## 2021-09-17 DIAGNOSIS — L719 Rosacea, unspecified: Secondary | ICD-10-CM | POA: Diagnosis not present

## 2021-09-17 DIAGNOSIS — L700 Acne vulgaris: Secondary | ICD-10-CM

## 2021-09-17 DIAGNOSIS — L570 Actinic keratosis: Secondary | ICD-10-CM

## 2021-09-17 MED ORDER — TRETINOIN 0.025 % EX CREA
TOPICAL_CREAM | CUTANEOUS | 3 refills | Status: DC
  Filled 2021-09-17: qty 45, 30d supply, fill #0
  Filled 2022-02-20: qty 45, 30d supply, fill #1
  Filled 2022-06-28: qty 45, 30d supply, fill #2
  Filled 2022-07-30: qty 45, 30d supply, fill #3

## 2021-09-17 MED ORDER — CITALOPRAM HYDROBROMIDE 40 MG PO TABS
ORAL_TABLET | Freq: Every day | ORAL | 1 refills | Status: DC
  Filled 2021-09-17: qty 90, 90d supply, fill #0
  Filled 2021-12-16: qty 90, 90d supply, fill #1

## 2021-09-17 NOTE — Progress Notes (Signed)
Follow-Up Visit   Subjective  Kimberly Figueroa is a 66 y.o. female who presents for the following: AK (Here for LN2 Tx of Bx proven AK at left medial calf. Bx: 08/21/2021) and Nail Problem (Recheck toenails. Hx of fungal culture performed at last visit. Patient reports she saw test was positive for fungus. Would like to discuss Tx today).  The following portions of the chart were reviewed this encounter and updated as appropriate:  Tobacco   Allergies   Meds   Problems   Med Hx   Surg Hx   Fam Hx       Review of Systems: No other skin or systemic complaints except as noted in HPI or Assessment and Plan.   Objective  Well appearing patient in no apparent distress; mood and affect are within normal limits.  A focused examination was performed including face, legs, feet/toenails. Relevant physical exam findings are noted in the Assessment and Plan.  left medial calf x1 Erythematous thin papules/macules with gritty scale.   Right Hallux Toe Nail Plate R-1 toenail, thickened with subungual debris, L-1 toenail with some onycholysis and white discoloration.   Head - Anterior (Face) Mid face erythema with telangiectasias   Assessment & Plan  AK (actinic keratosis) left medial calf x1  Actinic keratoses are precancerous spots that appear secondary to cumulative UV radiation exposure/sun exposure over time. They are chronic with expected duration over 1 year. A portion of actinic keratoses will progress to squamous cell carcinoma of the skin. It is not possible to reliably predict which spots will progress to skin cancer and so treatment is recommended to prevent development of skin cancer.  Recommend daily broad spectrum sunscreen SPF 30+ to sun-exposed areas, reapply every 2 hours as needed.  Recommend staying in the shade or wearing long sleeves, sun glasses (UVA+UVB protection) and wide brim hats (4-inch brim around the entire circumference of the hat). Call for new or changing  lesions.  Prior to procedure, discussed risks of blister formation, small wound, skin dyspigmentation, or rare scar following cryotherapy. Recommend Vaseline ointment to treated areas while healing.   Destruction of lesion - left medial calf x1  Destruction method: cryotherapy   Informed consent: discussed and consent obtained   Lesion destroyed using liquid nitrogen: Yes   Outcome: patient tolerated procedure well with no complications   Post-procedure details: wound care instructions given    Onychomycosis Right Hallux Toe Nail Plate  Chronic condition with duration or expected duration over one year. Condition is bothersome to patient. Currently flared.  Culture showed Candida guilliermondii  (reported to have frequent resistance to fluconazole but susceptibility to terbinafine)   Discussed options including oral terbinafine, topical therapy and laser.   Start [facility] antifungal Ciclopirox-Itraconazole-Fluconazole-Terbinafine HCl-Ibuprofen-DMSO. 68ml, 10Rf. Paint on once daily. Can cause irritation to surrounding skin - can protect surrounding skin with vaseline or wash off surrounding skin to reduce irritation.     Rosacea Head - Anterior (Face)  Acne/Rosacea Chronic condition with duration or expected duration over one year. Currently well-controlled.  Rosacea is a chronic progressive skin condition usually affecting the face of adults, causing redness and/or acne bumps. It is treatable but not curable. It sometimes affects the eyes (ocular rosacea) as well. It may respond to topical and/or systemic medication and can flare with stress, sun exposure, alcohol, exercise and some foods.  Daily application of broad spectrum spf 30+ sunscreen to face is recommended to reduce flares.  Continue Oracea 40mg  once daily as  directed. (Can switch back to doxycycline 20 mg bid if she does not feel the Oracea 40 mg works better). Increase to Doxycycline 100mg  as needed for worse  flares.  Continue Zilxi at bedtime, apply 2nd. Continue Tretinoin 0.025% cream at bedtime, apply 1st.  Doxycycline should be taken with food to prevent nausea. Do not lay down for 30 minutes after taking. Be cautious with sun exposure and use good sun protection while on this medication. Pregnant women should not take this medication.   Topical retinoid medications like tretinoin/Retin-A, adapalene/Differin, tazarotene/Fabior, and Epiduo/Epiduo Forte can cause dryness and irritation when first started. Only apply a pea-sized amount to the entire affected area. Avoid applying it around the eyes, edges of mouth and creases at the nose. If you experience irritation, use a good moisturizer first and/or apply the medicine less often. If you are doing well with the medicine, you can increase how often you use it until you are applying every night. Be careful with sun protection while using this medication as it can make you sensitive to the sun. This medicine should not be used by pregnant women.    Related Medications doxycycline (PERIOSTAT) 20 MG tablet Take 1 tablet by mouth 2 times daily with food  doxycycline (ORACEA) 40 MG capsule Take 1 capsule (40 mg total) by mouth daily. Take with food.  doxycycline (ADOXA) 100 MG tablet Take 1 tablet (100 mg total) by mouth 2 (two) times daily as needed for rosacea flares. Take with food.  Minocycline HCl Micronized (ZILXI) 1.5 % FOAM Apply a thin layer to the face QHS.  Acne vulgaris  Related Medications tretinoin (RETIN-A) 0.025 % cream Apply a pea sized amount to the entire face at bedtime   Return for TBSE As Scheduled, nail recheck 6 months.  I, Emelia Salisbury, CMA, am acting as scribe for Forest Gleason, MD.  Documentation: I have reviewed the above documentation for accuracy and completeness, and I agree with the above.  Forest Gleason, MD

## 2021-09-17 NOTE — Patient Instructions (Addendum)
Cryotherapy Aftercare  Wash gently with soap and water everyday.   Apply Vaseline and Band-Aid daily until healed.   Prior to procedure, discussed risks of blister formation, small wound, skin dyspigmentation, or rare scar following cryotherapy. Recommend Vaseline ointment to treated areas while healing.   Rosacea Continue Oracea 40mg  once daily as directed. Increase to Doxycycline 100mg  as needed for worse flares.  Continue Zilxi at bedtime, apply 2nd. Continue Tretinoin 0.025% cream at bedtime, apply 1st.  Instructions for [facility] Medications  One or more of your medications was sent to the [facility] mail order compounding pharmacy. You will receive an email from them and can purchase the medicine through that link. It will then be mailed to your home at the address you confirmed. If for any reason you do not receive an email from them, please check your spam folder. If you still do not find the email, please let us know. [facility] phone number is [phone].   If You Need Anything After Your Visit  If you have any questions or concerns for your doctor, please call our main line at [phone] and press option 4 to reach your doctor's medical assistant. If no one answers, please leave a voicemail as directed and we will return your call as soon as possible. Messages left after 4 pm will be answered the following business day.   You may also send [place] a message via [place]. We typically respond to MyChart messages within 1-2 business days.  For prescription refills, please ask your pharmacy to contact our office. Our fax number is [phone].  If you have an urgent issue when the clinic is closed that cannot wait until the next business day, you can page your doctor at the number below.    Please note that while we do our best to be available for urgent issues outside of office hours, we are not available 24/7.   If you have an urgent issue and are unable to reach  [place], you may choose to seek medical care at your doctor's office, retail clinic, urgent care center, or emergency room.  If you have a medical emergency, please immediately call 911 or go to the emergency department.  Pager Numbers  - Dr. Nehemiah Massed: [phone]  - Dr. Laurence Ferrari: [phone]  - Dr. Nicole Kindred: [phone]  In the event of inclement weather, please call our main line at [phone] for an update on the status of any delays or closures.  Dermatology Medication Tips: Please keep the boxes that topical medications come in in order to help keep track of the instructions about where and how to use these. Pharmacies typically print the medication instructions only on the boxes and not directly on the medication tubes.   If your medication is too expensive, please contact our office at [phone] option 4 or send [place] a message through [place].   We are unable to tell what your co-pay for medications will be in advance as this is different depending on your insurance coverage. However, we may be able to find a substitute medication at lower cost or fill out paperwork to get insurance to cover a needed medication.   If a prior authorization is required to get your medication covered by your insurance company, please allow [place] 1-2 business days to complete this process.  Drug prices often vary depending on where the prescription is filled and some pharmacies may offer cheaper prices.  The website www.goodrx.com contains coupons for medications through different pharmacies. The prices  here do not account for what the cost may be with help from insurance (it may be cheaper with your insurance), but the website can give you the price if you did not use any insurance.  - You can print the associated coupon and take it with your prescription to the pharmacy.  - You may also stop by our office during regular business hours and pick up a GoodRx coupon card.  - If you need your prescription sent  electronically to a different pharmacy, notify our office through [facility] or by phone at [phone] option 4.     Si Usted Necesita Algo Despus de Su Visita  Tambin puede enviarnos un mensaje a travs de Pharmacist, community. Por lo general respondemos a los mensajes de MyChart en el transcurso de 1 a 2 das hbiles.  Para renovar recetas, por favor pida a su farmacia que se ponga en contacto con nuestra oficina. Harland Dingwall de fax es [place] [phone].  Si tiene un asunto urgente cuando la clnica est cerrada y que no puede esperar hasta el siguiente da hbil, puede llamar/localizar a su doctor(a) al nmero que aparece a continuacin.   Por favor, tenga en cuenta que aunque hacemos todo lo posible para estar disponibles para asuntos urgentes fuera del horario de [place], no estamos disponibles las 24 horas del da, los 7 das de la [place].   Si tiene un problema urgente y no puede comunicarse con nosotros, puede optar por buscar atencin mdica  en el consultorio de su doctor(a), en una clnica privada, en un centro de atencin urgente o en una sala de emergencias.  Si tiene Engineering geologist, por favor llame inmediatamente al 911 o vaya a la sala de emergencias.  Nmeros de bper  - Dr. Nehemiah Massed: [phone]  - Dra. Moye: [phone]  - Dra. Nicole Kindred: [phone]  En caso de inclemencias del [place], por favor llame a Johnsie Kindred principal al [phone] para una actualizacin sobre el [place] de cualquier retraso o cierre.  Consejos para la medicacin en dermatologa: Por favor, guarde las cajas en las que vienen los medicamentos de uso tpico para ayudarle a seguir las instrucciones sobre dnde y cmo usarlos. Las farmacias generalmente imprimen las instrucciones del medicamento slo en las cajas y no directamente en los tubos del [place].   Si su medicamento es muy caro, por favor, pngase en contacto con Zigmund Daniel llamando al [phone] y presione la  opcin 4 o envenos un mensaje a travs de Pharmacist, community.   No podemos decirle cul ser su copago por los medicamentos por adelantado ya que esto es diferente dependiendo de la cobertura de su seguro. Sin embargo, es posible que podamos encontrar un medicamento sustituto a Electrical engineer un formulario para que el seguro cubra el medicamento que se considera necesario.   Si se requiere una autorizacin previa para que su compaa de seguros [place] su medicamento, por favor permtanos de 1 a 2 das hbiles para completar este proceso.  Los precios de los medicamentos varan con frecuencia dependiendo del Environmental consultant de dnde se surte la receta y alguna farmacias pueden ofrecer precios ms baratos.  El sitio web www.goodrx.com tiene cupones para medicamentos de Airline pilot. Los precios aqu no tienen en cuenta lo que podra costar con la ayuda del seguro (puede ser ms barato con su seguro), pero el sitio web puede darle el precio si no utiliz Research scientist (physical sciences).  - Puede imprimir el cupn correspondiente y llevarlo con su receta a la  farmacia.  - Tambin puede pasar por nuestra oficina durante el horario de atencin regular y Charity fundraiser una tarjeta de cupones de GoodRx.  - Si necesita que su receta se enve electrnicamente a una farmacia diferente, informe a nuestra oficina a travs de MyChart de [place] o por telfono llamando al [phone] y presione la opcin 4.

## 2021-09-20 ENCOUNTER — Encounter: Payer: Self-pay | Admitting: Dermatology

## 2021-09-21 ENCOUNTER — Other Ambulatory Visit (HOSPITAL_COMMUNITY): Payer: Self-pay

## 2021-10-02 ENCOUNTER — Other Ambulatory Visit (HOSPITAL_COMMUNITY): Payer: Self-pay

## 2021-10-16 ENCOUNTER — Other Ambulatory Visit: Payer: Self-pay

## 2021-10-16 MED ORDER — CARESTART COVID-19 HOME TEST VI KIT
PACK | 0 refills | Status: DC
  Filled 2021-10-16: qty 2, 4d supply, fill #0

## 2021-10-21 ENCOUNTER — Other Ambulatory Visit: Payer: Self-pay

## 2021-10-21 MED ORDER — CARESTART COVID-19 HOME TEST VI KIT
PACK | 0 refills | Status: DC
  Filled 2021-10-21: qty 2, 4d supply, fill #0

## 2021-11-18 DIAGNOSIS — M5416 Radiculopathy, lumbar region: Secondary | ICD-10-CM | POA: Diagnosis not present

## 2021-11-29 DIAGNOSIS — S6991XA Unspecified injury of right wrist, hand and finger(s), initial encounter: Secondary | ICD-10-CM | POA: Diagnosis not present

## 2021-11-29 DIAGNOSIS — M533 Sacrococcygeal disorders, not elsewhere classified: Secondary | ICD-10-CM | POA: Diagnosis not present

## 2021-11-29 DIAGNOSIS — M25571 Pain in right ankle and joints of right foot: Secondary | ICD-10-CM | POA: Diagnosis not present

## 2021-11-29 DIAGNOSIS — W19XXXA Unspecified fall, initial encounter: Secondary | ICD-10-CM | POA: Diagnosis not present

## 2021-11-29 DIAGNOSIS — Z043 Encounter for examination and observation following other accident: Secondary | ICD-10-CM | POA: Diagnosis not present

## 2021-11-29 DIAGNOSIS — S0990XA Unspecified injury of head, initial encounter: Secondary | ICD-10-CM | POA: Diagnosis not present

## 2021-11-29 DIAGNOSIS — M898X9 Other specified disorders of bone, unspecified site: Secondary | ICD-10-CM | POA: Diagnosis not present

## 2021-11-29 DIAGNOSIS — S99912A Unspecified injury of left ankle, initial encounter: Secondary | ICD-10-CM | POA: Diagnosis not present

## 2021-11-29 DIAGNOSIS — M25531 Pain in right wrist: Secondary | ICD-10-CM | POA: Diagnosis not present

## 2021-11-29 DIAGNOSIS — T07XXXA Unspecified multiple injuries, initial encounter: Secondary | ICD-10-CM | POA: Diagnosis not present

## 2021-12-02 ENCOUNTER — Other Ambulatory Visit: Payer: Self-pay | Admitting: Dermatology

## 2021-12-02 DIAGNOSIS — L719 Rosacea, unspecified: Secondary | ICD-10-CM

## 2021-12-16 ENCOUNTER — Other Ambulatory Visit (HOSPITAL_COMMUNITY): Payer: Self-pay

## 2021-12-17 ENCOUNTER — Encounter: Payer: Self-pay | Admitting: Internal Medicine

## 2021-12-17 DIAGNOSIS — Z1231 Encounter for screening mammogram for malignant neoplasm of breast: Secondary | ICD-10-CM | POA: Diagnosis not present

## 2021-12-22 IMAGING — DX DG LUMBAR SPINE 2-3V
3 series · 3 of 3 positions shown · non-contrast
Comparison: 10/06/2017

CLINICAL DATA: Low back pain

EXAM:
LUMBAR SPINE - 3 VIEW

[lumbar spine ap]
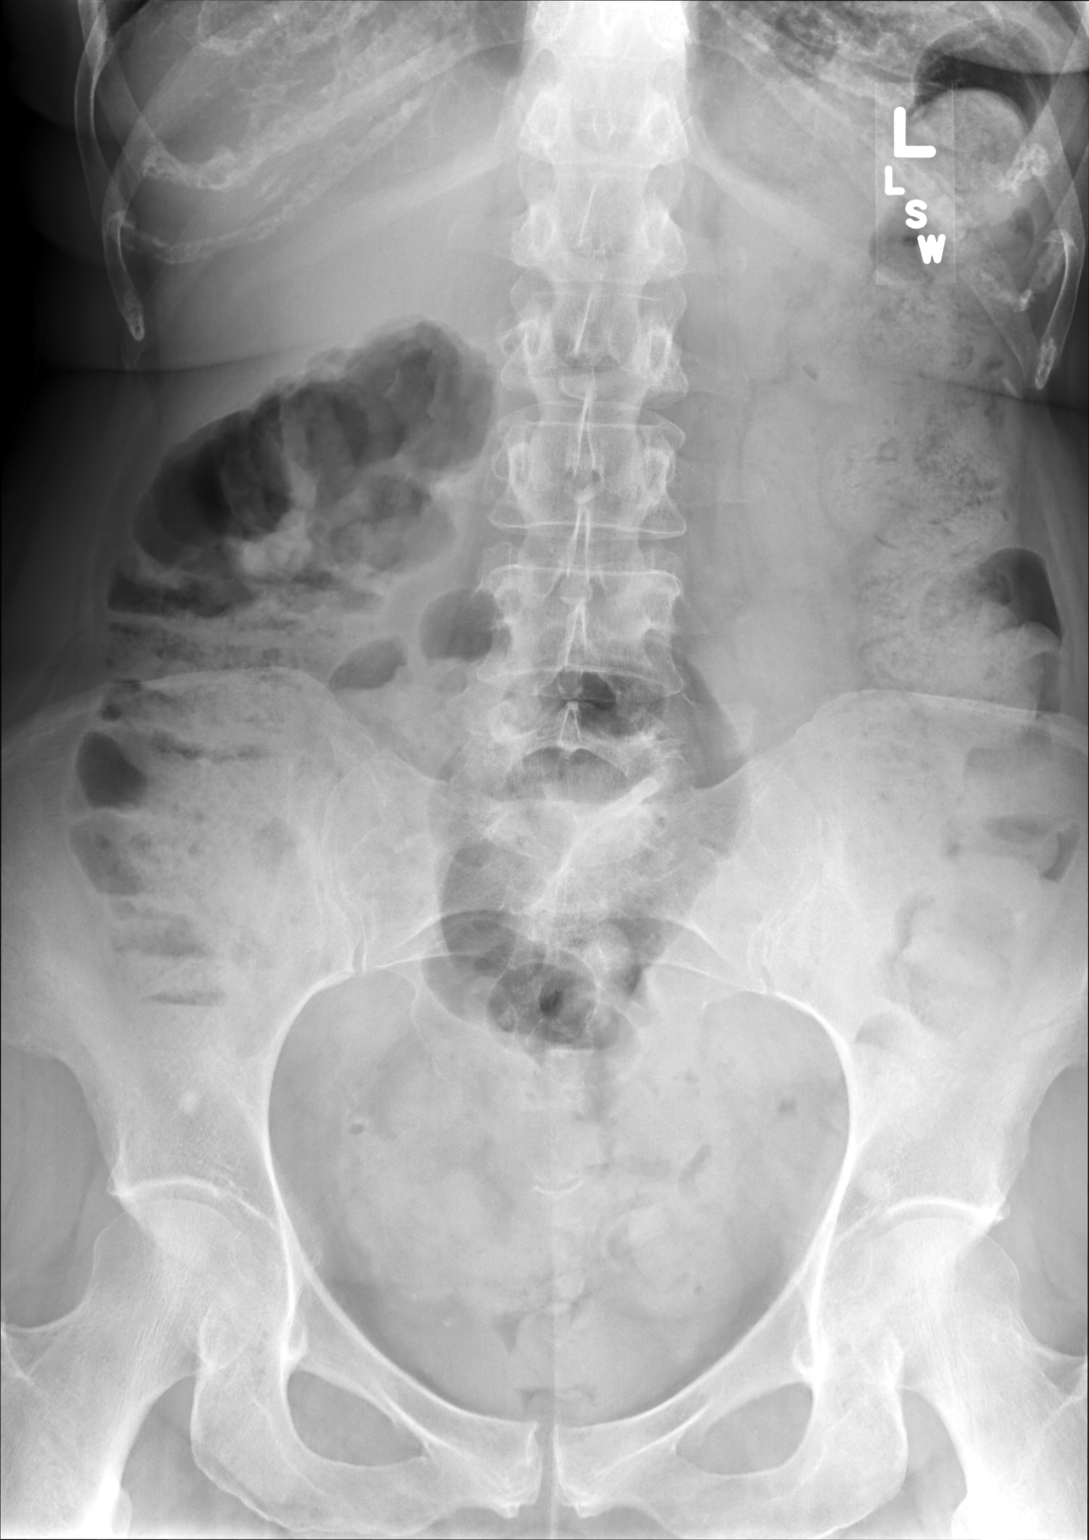

[lumbar spine lat]
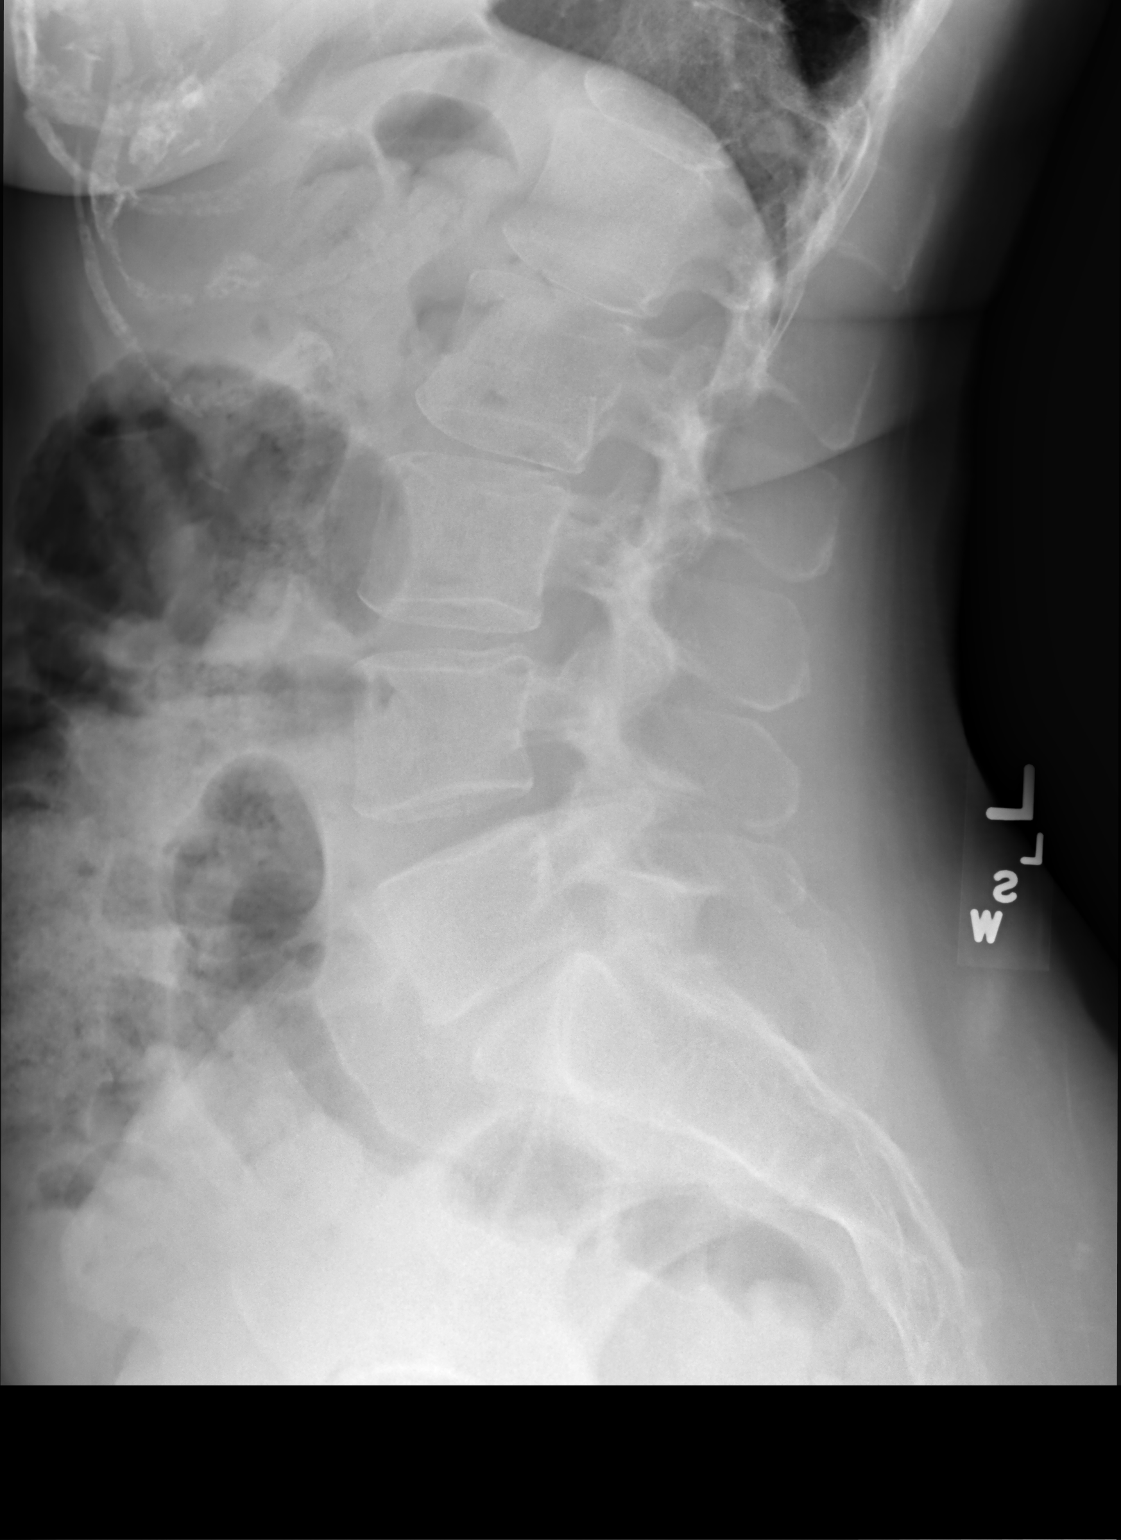

[lumbar spot lat]
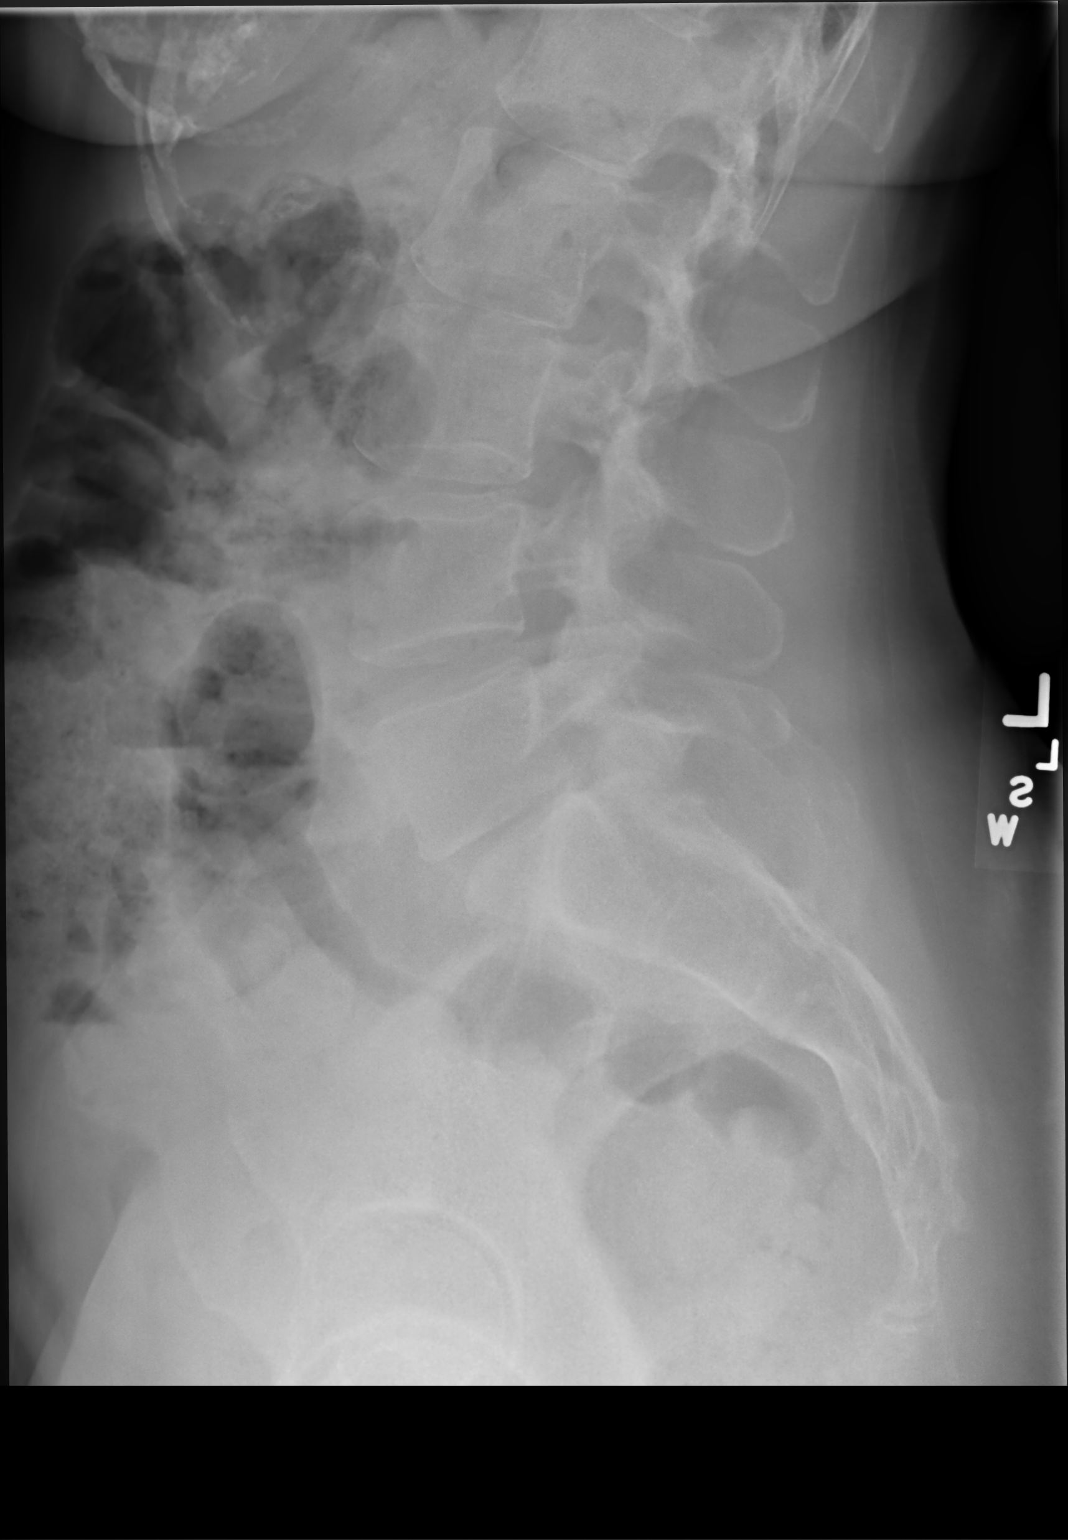

[3 of 3 positions shown; findings below may reference images not displayed]

FINDINGS: Five lumbar type vertebral bodies are well visualized. Vertebral
body height is well maintained. No anterolisthesis is noted. Disc
space narrowing is noted at L5-S1. No soft tissue abnormality is
seen. Mild retained fecal material is noted consistent with a degree
of constipation.
IMPRESSION: Mild degenerative change without acute abnormality.

## 2022-01-03 ENCOUNTER — Other Ambulatory Visit (HOSPITAL_COMMUNITY): Payer: Self-pay

## 2022-01-07 ENCOUNTER — Other Ambulatory Visit (HOSPITAL_COMMUNITY): Payer: Self-pay

## 2022-01-07 ENCOUNTER — Other Ambulatory Visit: Payer: Self-pay | Admitting: Adult Health

## 2022-01-07 MED ORDER — ZOLPIDEM TARTRATE 10 MG PO TABS
ORAL_TABLET | Freq: Every evening | ORAL | 1 refills | Status: DC | PRN
  Filled 2022-01-07: qty 90, 90d supply, fill #0
  Filled 2022-05-12: qty 90, 90d supply, fill #1

## 2022-01-14 ENCOUNTER — Telehealth (INDEPENDENT_AMBULATORY_CARE_PROVIDER_SITE_OTHER): Payer: 59 | Admitting: Adult Health

## 2022-01-14 ENCOUNTER — Other Ambulatory Visit (HOSPITAL_COMMUNITY): Payer: Self-pay

## 2022-01-14 DIAGNOSIS — R400 Somnolence: Secondary | ICD-10-CM

## 2022-01-14 DIAGNOSIS — F5104 Psychophysiologic insomnia: Secondary | ICD-10-CM | POA: Diagnosis not present

## 2022-01-14 MED ORDER — MODAFINIL 100 MG PO TABS
ORAL_TABLET | Freq: Every day | ORAL | 1 refills | Status: DC
  Filled 2022-01-14: qty 90, 90d supply, fill #0
  Filled 2022-06-28: qty 90, 90d supply, fill #1

## 2022-01-14 NOTE — Progress Notes (Signed)
   PATIENT: Kimberly Figueroa DOB: 02/05/1956  REASON FOR VISIT: follow up HISTORY FROM: patient  Virtual Visit via Video Note  I connected with Kimberly Figueroa on 01/14/22 at  1:30 PM EDT by a video enabled telemedicine application located remotely at [facility] and verified that I am speaking with the correct person using two identifiers who was located at their own home.   I discussed the limitations of evaluation and management by telemedicine and the availability of in person appointments. The patient expressed understanding and agreed to proceed.   PATIENT: Kimberly Figueroa DOB: 10/02/1955  REASON FOR VISIT: follow up HISTORY FROM: patient  HISTORY OF PRESENT ILLNESS: Today 01/14/22:  Kimberly Figueroa is a 65-year-old female with a history of insomnia and daytime sleepiness.  She returns today for follow-up.  She reports that she typically takes half a tablet of Ambien most nights.  On occasion she will have to take the extra half of Ambien.  She continues to use Provigil during the day.  But she primarily uses it only when she is driving.  07/16/21: Kimberly Figueroa is a 65-year-old female with a history of insomnia and daytime sleepiness.  She returns today for follow-up.  She continues on Ambien and Provigil.  She states that typically she can take a half a tablet of Ambien and that works well.  She states that her father recently passed and she has been using 1 full tablet of Ambien but plans to reduce her dose in the future.  She does not take Provigil daily.  Continues to only use it if she has to drop early in the morning.  01/22/21: Kimberly Figueroa is a 64-year-old female with a history of insomnia and daytime sleepiness.  She returns today for follow-up.  She states that she typically takes a half a tablet of Ambien nightly.  On occasion she does have to take the other half.  She reports on the rare occasion she will have to go ahead and take  the full tablet at bedtime.  She states that she has Provigil but probably is only used 3 times in the last month.  Overall she feels that this is working well for her.  She returns today for an evaluation.  HISTORY Kimberly Figueroa is a 66 y.o. female who has been followed in this office for insomnia and daytime sleepiness.  She was initially scheduled for MyChart video visit however she was unable to hear me so we transferred to a telephone visit.  She states that over the last month her stress levels have increased.  Therefore she has been having a difficult time sleeping and has been using 1 full tablet of Ambien.  She also reports that she has been having low back pain and is seeing pain management.  She states this also disrupts her sleep.  She states that she has Provigil but probably use it 5 to 10 days out of a month.  She typically only uses it when she has early morning meetings.  She returns today for follow-up.    REVIEW OF SYSTEMS: Out of a complete 14 system review of symptoms, the patient complains only of the following symptoms, and all other reviewed systems are negative.  See HPI  ALLERGIES: Allergies  Allergen Reactions   Darvon [Propoxyphene] Nausea Only   Vicodin [Hydrocodone-Acetaminophen] Itching    HOME MEDICATIONS: Outpatient Medications Prior to Visit  Medication Sig Dispense Refill   zolpidem (AMBIEN) 10 MG tablet   TAKE 1/2 TO 1 TABLET BY MOUTH AT BEDTIME AS NEEDED FOR SLEEP 90 tablet 1   citalopram (CELEXA) 40 MG tablet TAKE 1 TABLET BY MOUTH DAILY. 90 tablet 1   COVID-19 At Home Antigen Test (CARESTART COVID-19 HOME TEST) KIT Use as directed 2 kit 0   cyclobenzaprine (FLEXERIL) 5 MG tablet Take 1 tablet by mouth 2 times daily 180 tablet 3   doxycycline (ORACEA) 40 MG capsule TAKE ONE CAPSULE (40 MG TOTAL) BY MOUTH DAILY. TAKE WITH FOOD. 30 capsule 3   Lifitegrast (XIIDRA) 5 % SOLN Instill 1 drop into both eyes every 12 hours as directed 180 each 3    Minocycline HCl Micronized (ZILXI) 1.5 % FOAM Apply a thin layer to the face QHS. 30 g 2   modafinil (PROVIGIL) 100 MG tablet TAKE 1 TABLET (100 MG TOTAL) BY MOUTH DAILY. 90 tablet 1   Multiple Vitamins-Minerals (MULTIVITAMIN GUMMIES ADULTS PO) Take by mouth. VitaCraves     mupirocin ointment (BACTROBAN) 2 % Apply to wound daily as directed until healed. 22 g 0   oxyCODONE (OXY IR/ROXICODONE) 5 MG immediate release tablet Take 1 tablet by mouth every 8 hours as needed 90 tablet 0   Progesterone Micronized 10 % CREA Apply 13m topically every day. 1 g 1   tretinoin (RETIN-A) 0.025 % cream Apply a pea sized amount to the entire face at bedtime 45 g 3   UNABLE TO FIND 2 (two) times daily as needed. Skin Medicinals(cream):  Azelaic Acid 15%, Ivermectin 1%, Metronidazole 1%     No facility-administered medications prior to visit.    PAST MEDICAL HISTORY: Past Medical History:  Diagnosis Date   Endometriosis    History of frequent urinary tract infections    s/p bladder dilatation (11/05)   Hypersomnia, recurrent 06/28/2014   ITP (idiopathic thrombocytopenic purpura)    Pneumonia    Rosacea     PAST SURGICAL HISTORY: Past Surgical History:  Procedure Laterality Date   bladder dilatation  11/05   CATARACT EXTRACTION W/PHACO Right 12/13/2019   Procedure: CATARACT EXTRACTION PHACO AND INTRAOCULAR LENS PLACEMENT (IKanab RIGHT VIVITY TORIC LENS 4.35 00:30.2;  Surgeon: PBirder Robson MD;  [facility]  Service: Ophthalmology;  Laterality: Right;   CATARACT EXTRACTION W/PHACO Left 01/10/2020   Procedure: CATARACT EXTRACTION PHACO AND INTRAOCULAR LENS PLACEMENT (IOC) LEFT VIVITY TORIC LENS 6.19  00:32.8;  Surgeon: PBirder Robson MD;  [facility]  Service: Ophthalmology;  Laterality: Left;   CESAREAN SECTION  1993   COLONOSCOPY     laproscopic surgery  10/06   found to have endometriosis   SHOULDER SURGERY Left 2008   [facility] [place]    FAMILY HISTORY: Family History  Problem Relation Age of Onset   Alcohol abuse Mother    Varicose Veins Mother    Diabetes Father    Hearing loss Father    Heart disease Father    Crohn's disease Father    Crohn's disease Paternal Grandfather    Breast cancer Neg Hx    Colon cancer Neg Hx     SOCIAL HISTORY: Social History   Socioeconomic History   Marital status: Married    Spouse name: WGwyndolyn Saxon  Number of children: 2   Years of education: college   Highest education level: Not on file  Occupational History   Occupation: EAdvertising account planner Director    Employer: [employer]  Tobacco Use  Smoking status: Never   Smokeless tobacco: Never  Vaping Use   Vaping Use: Never used  Substance and Sexual Activity   Alcohol use: Yes    Alcohol/week: 2.0 standard drinks    Types: 2 Glasses of wine per week   Drug use: No   Sexual activity: Not on file  Other Topics Concern   Not on file  Social History Narrative   Patient consumes 6-8 cups tea daily, is right handed   Social Determinants of Health   Financial Resource Strain: Not on file  Food Insecurity: Not on file  Transportation Needs: Not on file  Physical Activity: Not on file  Stress: Not on file  Social Connections: Not on file  Intimate Partner Violence: Not on file      PHYSICAL EXAM Generalized: Well developed, in no acute distress   Neurological examination  Mentation: Alert oriented to time, place, history taking. Follows all commands speech and language fluent Cranial nerve II-XII:. Facial symmetry noted.  Reflexes: UTA  DIAGNOSTIC DATA (LABS, IMAGING, TESTING) - I reviewed patient records, labs, notes, testing and imaging myself where available.  Lab Results  Component Value Date   WBC 5.4 04/23/2021   HGB 12.9 04/23/2021   HCT 39.7 04/23/2021   MCV 94.7 04/23/2021   PLT 176.0 04/23/2021      Component Value Date/Time   NA 142 04/23/2021 1433   K 3.7  04/23/2021 1433   CL 104 04/23/2021 1433   CO2 29 04/23/2021 1433   GLUCOSE 73 04/23/2021 1433   BUN 11 04/23/2021 1433   CREATININE 0.69 04/23/2021 1433   CALCIUM 9.0 04/23/2021 1433   PROT 6.7 04/23/2021 1433   ALBUMIN 4.3 04/23/2021 1433   AST 18 04/23/2021 1433   ALT 14 04/23/2021 1433   ALKPHOS 76 04/23/2021 1433   BILITOT 0.4 04/23/2021 1433   Lab Results  Component Value Date   CHOL 186 04/23/2021   HDL 77.10 04/23/2021   LDLCALC 95 04/23/2021   TRIG 72.0 04/23/2021   CHOLHDL 2 04/23/2021   Lab Results  Component Value Date   HGBA1C 6.0 04/23/2021   No results found for: VITAMINB12 Lab Results  Component Value Date   TSH 1.81 04/23/2021      ASSESSMENT AND PLAN 66 y.o. year old female  has a past medical history of Endometriosis, History of frequent urinary tract infections, Hypersomnia, recurrent (06/28/2014), ITP (idiopathic thrombocytopenic purpura), Pneumonia, and Rosacea. here with:  1.  Insomnia 2.  Daytime sleepiness  --Continue Ambien 10 mg half a tablet to 1 tablet at bedtime as needed -- Continue Provigil 100 mg 1 tablet daily if needed -- Advised if symptoms worsen or she develops new symptoms they should let us know -- Follow-up in 6 months or sooner if needed   . Ward Givens, MSN, NP-C 01/14/2022, 1:24 PM [facility] [address] [place], [place] [address] [phone]

## 2022-01-15 ENCOUNTER — Other Ambulatory Visit (HOSPITAL_COMMUNITY): Payer: Self-pay

## 2022-01-29 ENCOUNTER — Encounter: Payer: Self-pay | Admitting: Dermatology

## 2022-01-30 ENCOUNTER — Other Ambulatory Visit (HOSPITAL_COMMUNITY): Payer: Self-pay

## 2022-01-30 DIAGNOSIS — Z961 Presence of intraocular lens: Secondary | ICD-10-CM | POA: Diagnosis not present

## 2022-01-30 DIAGNOSIS — H26493 Other secondary cataract, bilateral: Secondary | ICD-10-CM | POA: Diagnosis not present

## 2022-01-30 DIAGNOSIS — H16223 Keratoconjunctivitis sicca, not specified as Sjogren's, bilateral: Secondary | ICD-10-CM | POA: Diagnosis not present

## 2022-01-30 DIAGNOSIS — H04123 Dry eye syndrome of bilateral lacrimal glands: Secondary | ICD-10-CM | POA: Diagnosis not present

## 2022-01-30 MED ORDER — XIIDRA 5 % OP SOLN
OPHTHALMIC | 3 refills | Status: DC
  Filled 2022-01-30: qty 180, 90d supply, fill #0
  Filled 2022-04-22: qty 180, 90d supply, fill #1
  Filled 2022-06-10 – 2022-07-30 (×2): qty 180, 90d supply, fill #2
  Filled 2023-01-13: qty 180, 90d supply, fill #3

## 2022-01-31 ENCOUNTER — Other Ambulatory Visit (HOSPITAL_COMMUNITY): Payer: Self-pay

## 2022-02-05 ENCOUNTER — Other Ambulatory Visit (HOSPITAL_COMMUNITY): Payer: Self-pay

## 2022-02-05 DIAGNOSIS — M5136 Other intervertebral disc degeneration, lumbar region: Secondary | ICD-10-CM | POA: Diagnosis not present

## 2022-02-05 DIAGNOSIS — M5416 Radiculopathy, lumbar region: Secondary | ICD-10-CM | POA: Diagnosis not present

## 2022-02-05 MED ORDER — OXYCODONE HCL 5 MG PO TABS
ORAL_TABLET | ORAL | 0 refills | Status: DC
  Filled 2022-02-05: qty 90, 30d supply, fill #0

## 2022-02-05 MED ORDER — CYCLOBENZAPRINE HCL 5 MG PO TABS
ORAL_TABLET | ORAL | 3 refills | Status: DC
  Filled 2022-02-05: qty 180, 90d supply, fill #0
  Filled 2022-04-22: qty 180, 90d supply, fill #1
  Filled 2022-07-30: qty 180, 90d supply, fill #2
  Filled 2022-10-28: qty 180, 90d supply, fill #3

## 2022-02-20 ENCOUNTER — Other Ambulatory Visit (HOSPITAL_COMMUNITY): Payer: Self-pay

## 2022-03-04 DIAGNOSIS — M5416 Radiculopathy, lumbar region: Secondary | ICD-10-CM | POA: Diagnosis not present

## 2022-03-15 ENCOUNTER — Other Ambulatory Visit: Payer: Self-pay | Admitting: Internal Medicine

## 2022-03-16 MED ORDER — CITALOPRAM HYDROBROMIDE 40 MG PO TABS
ORAL_TABLET | Freq: Every day | ORAL | 1 refills | Status: DC
  Filled 2022-03-16: qty 90, 90d supply, fill #0
  Filled 2022-04-22 – 2022-06-25 (×2): qty 90, 90d supply, fill #1

## 2022-03-17 ENCOUNTER — Other Ambulatory Visit (HOSPITAL_COMMUNITY): Payer: Self-pay

## 2022-03-27 ENCOUNTER — Other Ambulatory Visit (HOSPITAL_COMMUNITY): Payer: Self-pay

## 2022-03-27 ENCOUNTER — Ambulatory Visit (INDEPENDENT_AMBULATORY_CARE_PROVIDER_SITE_OTHER): Payer: 59 | Admitting: Dermatology

## 2022-03-27 ENCOUNTER — Encounter: Payer: Self-pay | Admitting: Dermatology

## 2022-03-27 DIAGNOSIS — L219 Seborrheic dermatitis, unspecified: Secondary | ICD-10-CM | POA: Diagnosis not present

## 2022-03-27 DIAGNOSIS — L814 Other melanin hyperpigmentation: Secondary | ICD-10-CM

## 2022-03-27 DIAGNOSIS — L719 Rosacea, unspecified: Secondary | ICD-10-CM | POA: Diagnosis not present

## 2022-03-27 DIAGNOSIS — B351 Tinea unguium: Secondary | ICD-10-CM

## 2022-03-27 MED ORDER — DOXYCYCLINE HYCLATE 100 MG PO CAPS
100.0000 mg | ORAL_CAPSULE | Freq: Two times a day (BID) | ORAL | 2 refills | Status: AC
  Filled 2022-03-27: qty 60, 30d supply, fill #0

## 2022-03-27 MED ORDER — KETOCONAZOLE 2 % EX CREA
TOPICAL_CREAM | CUTANEOUS | 2 refills | Status: DC
  Filled 2022-03-27: qty 30, 10d supply, fill #0

## 2022-03-27 MED ORDER — TRIAMCINOLONE ACETONIDE 0.1 % EX CREA
TOPICAL_CREAM | CUTANEOUS | 1 refills | Status: DC
  Filled 2022-03-27: qty 30, 10d supply, fill #0

## 2022-03-27 NOTE — Patient Instructions (Addendum)
[place] BBL  Ears:  tart Ketoconazole 2% cream twice daily until clear.  Start Triamcinolone 0.1% cream twice daily up to 2 weeks as needed for itching. Avoid applying to face, groin, and axilla. Use as directed. Long-term use can cause thinning of the skin.   Topical steroids (such as triamcinolone, fluocinolone, fluocinonide, mometasone, clobetasol, halobetasol, betamethasone, hydrocortisone) can cause thinning and lightening of the skin if they are used for too long in the same area. Your physician has selected the right strength medicine for your problem and area affected on the body. Please use your medication only as directed by your physician to prevent side effects.     Toenails:  Continue Skin Medicinals antifungal Ciclopirox-Itraconazole-Fluconazole-Terbinafine HCl-Ibuprofen-DMSO. 56m, 10Rf. Paint on once daily. Can cause irritation to surrounding skin - can protect surrounding skin with vaseline or wash off surrounding skin to reduce irritation.   Rosacea:  Chronic condition with duration or expected duration over one year. Currently well-controlled.   Rosacea is a chronic progressive skin condition usually affecting the face of adults, causing redness and/or acne bumps. It is treatable but not curable. It sometimes affects the eyes (ocular rosacea) as well. It may respond to topical and/or systemic medication and can flare with stress, sun exposure, alcohol, exercise and some foods.  Daily application of broad spectrum spf 30+ sunscreen to face is recommended to reduce flares.   Continue Oracea '40mg'$  once daily as directed. (Can switch back to doxycycline 20 mg bid if she does not feel the Oracea 40 mg works better). Increase to Doxycycline '100mg'$  as needed for worse flares.  Continue Zilxi at bedtime, apply 2nd. Continue Tretinoin 0.025% cream at bedtime, apply 1st.   Doxycycline should be taken with food to prevent nausea. Do not lay down for 30 minutes after taking. Be  cautious with sun exposure and use good sun protection while on this medication. Pregnant women should not take this medication.    Topical retinoid medications like tretinoin/Retin-A, adapalene/Differin, tazarotene/Fabior, and Epiduo/Epiduo Forte can cause dryness and irritation when first started. Only apply a pea-sized amount to the entire affected area. Avoid applying it around the eyes, edges of mouth and creases at the nose. If you experience irritation, use a good moisturizer first and/or apply the medicine less often. If you are doing well with the medicine, you can increase how often you use it until you are applying every night. Be careful with sun protection while using this medication as it can make you sensitive to the sun. This medicine should not be used by pregnant women.      Due to recent changes in healthcare laws, you may see results of your pathology and/or laboratory studies on MyChart before the doctors have had a chance to review them. We understand that in some cases there may be results that are confusing or concerning to you. Please understand that not all results are received at the same time and often the doctors may need to interpret multiple results in order to provide you with the best plan of care or course of treatment. Therefore, we ask that you please give u[place]2 business days to thoroughly review all your results before contacting the office for clarification. Should we see a critical lab result, you will be contacted sooner.   If You Need Anything After Your Visit  If you have any questions or concerns for your doctor, please call our main line at [phone] press option 4 to reach your doctor's medical assistant. If  no one answers, please leave a voicemail as directed and we will return your call as soon as possible. Messages left after 4 pm will be answered the following business day.   You may also send [place] a message via [place]. We typically respond to  MyChart messages within 1-2 business days.  For prescription refills, please ask your pharmacy to contact our office. Our fax number is [phone].  If you have an urgent issue when the clinic is closed that cannot wait until the next business day, you can page your doctor at the number below.    Please note that while we do our best to be available for urgent issues outside of office hours, we are not available 24/7.   If you have an urgent issue and are unable to reach [place], you may choose to seek medical care at your doctor's office, retail clinic, urgent care center, or emergency room.  If you have a medical emergency, please immediately call 911 or go to the emergency department.  Pager Numbers  - Dr. Nehemiah Massed: [phone]  - Dr. Laurence Ferrari: [phone]  - Dr. Nicole Kindred: [phone]  In the event of inclement weather, please call our main line at [phone] for an update on the status of any delays or closures.  Dermatology Medication Tips: Please keep the boxes that topical medications come in in order to help keep track of the instructions about where and how to use these. Pharmacies typically print the medication instructions only on the boxes and not directly on the medication tubes.   If your medication is too expensive, please contact our office at [phone] option 4 or send [place] a message through [place].   We are unable to tell what your co-pay for medications will be in advance as this is different depending on your insurance coverage. However, we may be able to find a substitute medication at lower cost or fill out paperwork to get insurance to cover a needed medication.   If a prior authorization is required to get your medication covered by your insurance company, please allow [place] 1-2 business days to complete this process.  Drug prices often vary depending on where the prescription is filled and some pharmacies may offer cheaper prices.  The website www.goodrx.com  contains coupons for medications through different pharmacies. The prices here do not account for what the cost may be with help from insurance (it may be cheaper with your insurance), but the website can give you the price if you did not use any insurance.  - You can print the associated coupon and take it with your prescription to the pharmacy.  - You may also stop by our office during regular business hours and pick up a GoodRx coupon card.  - If you need your prescription sent electronically to a different pharmacy, notify our office through [facility] or by phone at [phone] option 4.     Si Usted Necesita Algo Despus de Su Visita  Tambin puede enviarnos un mensaje a travs de Pharmacist, community. Por lo general respondemos a los mensajes de MyChart en el transcurso de 1 a 2 das hbiles.  Para renovar recetas, por favor pida a su farmacia que se ponga en contacto con nuestra oficina. Harland Dingwall de fax es [place] [phone].  Si tiene un asunto urgente cuando la clnica est cerrada y que no puede esperar hasta el siguiente da hbil, puede llamar/localizar a su doctor(a) al nmero que aparece a continuacin.   Por favor, tenga en  cuenta que aunque hacemos todo lo posible para estar disponibles para asuntos urgentes fuera del horario de [place], no estamos disponibles las 24 horas del da, los 7 das de la [place].   Si tiene un problema urgente y no puede comunicarse con nosotros, puede optar por buscar atencin mdica  en el consultorio de su doctor(a), en una clnica privada, en un centro de atencin urgente o en una sala de emergencias.  Si tiene Engineering geologist, por favor llame inmediatamente al 911 o vaya a la sala de emergencias.  Nmeros de bper  - Dr. Nehemiah Massed: [phone]  - Dra. Moye: [phone]  - Dra. Nicole Kindred: [phone]  En caso de inclemencias del [place], por favor llame a Johnsie Kindred principal al [phone] para una actualizacin sobre el [place]  de cualquier retraso o cierre.  Consejos para la medicacin en dermatologa: Por favor, guarde las cajas en las que vienen los medicamentos de uso tpico para ayudarle a seguir las instrucciones sobre dnde y cmo usarlos. Las farmacias generalmente imprimen las instrucciones del medicamento slo en las cajas y no directamente en los tubos del [place].   Si su medicamento es muy caro, por favor, pngase en contacto con Zigmund Daniel llamando al [phone] y presione la opcin 4 o envenos un mensaje a travs de Pharmacist, community.   No podemos decirle cul ser su copago por los medicamentos por adelantado ya que esto es diferente dependiendo de la cobertura de su seguro. Sin embargo, es posible que podamos encontrar un medicamento sustituto a Electrical engineer un formulario para que el seguro cubra el medicamento que se considera necesario.   Si se requiere una autorizacin previa para que su compaa de seguros [place] su medicamento, por favor permtanos de 1 a 2 das hbiles para completar este proceso.  Los precios de los medicamentos varan con frecuencia dependiendo del Environmental consultant de dnde se surte la receta y alguna farmacias pueden ofrecer precios ms baratos.  El sitio web www.goodrx.com tiene cupones para medicamentos de Airline pilot. Los precios aqu no tienen en cuenta lo que podra costar con la ayuda del seguro (puede ser ms barato con su seguro), pero el sitio web puede darle el precio si no utiliz Research scientist (physical sciences).  - Puede imprimir el cupn correspondiente y llevarlo con su receta a la farmacia.  - Tambin puede pasar por nuestra oficina durante el horario de atencin regular y Charity fundraiser una tarjeta de cupones de GoodRx.  - Si necesita que su receta se enve electrnicamente a una farmacia diferente, informe a nuestra oficina a travs de MyChart de [place] o por telfono llamando al [phone] y presione la opcin 4.

## 2022-03-27 NOTE — Progress Notes (Signed)
Follow-Up Visit   Subjective  Kimberly Figueroa is a 67 y.o. female who presents for the following: Nail Problem (Great toenails. 6 month recheck. Using Skin Medicinals antifungal nail compound. Patient reports improvement) and Rosacea (With acne. Face. Taking Oracea 40 mg, increases to Doxycycline 100 mg for worse flares. Using Zilxi and Tretinoin 0.025% as directed. Patient states rosacea is well controlled ).  The following portions of the chart were reviewed this encounter and updated as appropriate:  Tobacco  Allergies  Meds  Problems  Med Hx  Surg Hx  Fam Hx      Review of Systems: No other skin or systemic complaints except as noted in HPI or Assessment and Plan.   Objective  Well appearing patient in no apparent distress; mood and affect are within normal limits.  A focused examination was performed including face. toenails. Relevant physical exam findings are noted in the Assessment and Plan.  right great toenail Subungual debris, mild thickening of nail plate  face Mild pinkness at cheeks  ears Pink patches with greasy scale.   face Scattered tan macules.    Assessment & Plan  Tinea unguium right great toenail  Chronic and persistent condition with duration or expected duration over one year. Condition is symptomatic/ bothersome to patient. Not currently at goal.  Continue Skin Medicinals antifungal Ciclopirox-Itraconazole-Fluconazole-Terbinafine HCl-Ibuprofen-DMSO. 21m, 10Rf. Paint on once daily. Can cause irritation to surrounding skin - can protect surrounding skin with vaseline or wash off surrounding skin to reduce irritation.  Rosacea face  Chronic condition with duration or expected duration over one year. Currently well-controlled.   Rosacea is a chronic progressive skin condition usually affecting the face of adults, causing redness and/or acne bumps. It is treatable but not curable. It sometimes affects the eyes (ocular rosacea) as well. It  may respond to topical and/or systemic medication and can flare with stress, sun exposure, alcohol, exercise and some foods.  Daily application of broad spectrum spf 30+ sunscreen to face is recommended to reduce flares.   Continue Oracea '40mg'$  once daily as directed. (Can switch back to doxycycline 20 mg bid if she does not feel the Oracea 40 mg works better). Increase to Doxycycline '100mg'$  daily to twice a day as needed for worse flares, then decrease back to Oracea Continue Zilxi at bedtime, apply 2nd. Continue Tretinoin 0.025% cream at bedtime, apply 1st.   Doxycycline should be taken with food to prevent nausea. Do not lay down for 30 minutes after taking. Be cautious with sun exposure and use good sun protection while on this medication. Pregnant women should not take this medication.    Topical retinoid medications like tretinoin/Retin-A, adapalene/Differin, tazarotene/Fabior, and Epiduo/Epiduo Forte can cause dryness and irritation when first started. Only apply a pea-sized amount to the entire affected area. Avoid applying it around the eyes, edges of mouth and creases at the nose. If you experience irritation, use a good moisturizer first and/or apply the medicine less often. If you are doing well with the medicine, you can increase how often you use it until you are applying every night. Be careful with sun protection while using this medication as it can make you sensitive to the sun. This medicine should not be used by pregnant women.     doxycycline (VIBRAMYCIN) 100 MG capsule - face Take 1 capsule (100 mg total) by mouth 2 (two) times daily. Take with food  Related Medications Minocycline HCl Micronized (ZILXI) 1.5 % FOAM Apply a thin layer to the  face QHS.  doxycycline (ORACEA) 40 MG capsule TAKE ONE CAPSULE (40 MG TOTAL) BY MOUTH DAILY. TAKE WITH FOOD.  Seborrheic dermatitis ears  Chronic and persistent condition with duration or expected duration over one year. Condition is  bothersome/symptomatic for patient. Currently flared.  Seborrheic Dermatitis  -  is a chronic persistent rash characterized by pinkness and scaling most commonly of the mid face but also can occur on the scalp (dandruff), ears; mid chest, mid back and groin.  It tends to be exacerbated by stress and cooler weather.  People who have neurologic disease may experience new onset or exacerbation of existing seborrheic dermatitis.  The condition is not curable but treatable and can be controlled.  Start Ketoconazole 2% cream twice daily until clear.  Start Triamcinolone 0.1% cream twice daily up to 2 weeks as needed for itching. Avoid applying to face, groin, and axilla. Use as directed. Long-term use can cause thinning of the skin.  Topical steroids (such as triamcinolone, fluocinolone, fluocinonide, mometasone, clobetasol, halobetasol, betamethasone, hydrocortisone) can cause thinning and lightening of the skin if they are used for too long in the same area. Your physician has selected the right strength medicine for your problem and area affected on the body. Please use your medication only as directed by your physician to prevent side effects.    ketoconazole (NIZORAL) 2 % cream - ears Apply to ears twice daily until clear  triamcinolone cream (KENALOG) 0.1 % - ears Apply twice daily to ears up to 2 weeks as needed for itching.  Lentigines face  Discussed chemical peels in office. Recommend fall/winter for procedure.   Discussed the treatment option of BBL/laser.  Typically we recommend 1-3 treatment sessions about 5-8 weeks apart for best results.  The patient's condition may require "maintenance treatments" in the future.  The fee for BBL / laser treatments is $350 per treatment session for the whole face.  A fee can be quoted for other parts of the body.  Insurance typically does not pay for BBL/laser treatments and therefore the fee is an out-of-pocket cost.    Return for TBSE As  Scheduled.  I, Emelia Salisbury, CMA, am acting as scribe for Forest Gleason, MD.  Documentation: I have reviewed the above documentation for accuracy and completeness, and I agree with the above.  Forest Gleason, MD

## 2022-03-28 ENCOUNTER — Other Ambulatory Visit (HOSPITAL_COMMUNITY): Payer: Self-pay

## 2022-04-09 ENCOUNTER — Encounter: Payer: Self-pay | Admitting: Dermatology

## 2022-04-22 ENCOUNTER — Other Ambulatory Visit (HOSPITAL_COMMUNITY): Payer: Self-pay

## 2022-04-29 ENCOUNTER — Other Ambulatory Visit: Payer: Self-pay | Admitting: Dermatology

## 2022-04-29 DIAGNOSIS — L719 Rosacea, unspecified: Secondary | ICD-10-CM

## 2022-05-07 ENCOUNTER — Other Ambulatory Visit (HOSPITAL_COMMUNITY): Payer: Self-pay

## 2022-05-07 DIAGNOSIS — M5136 Other intervertebral disc degeneration, lumbar region: Secondary | ICD-10-CM | POA: Diagnosis not present

## 2022-05-07 MED ORDER — OXYCODONE-ACETAMINOPHEN 5-325 MG PO TABS
ORAL_TABLET | ORAL | 0 refills | Status: DC
Start: 2022-05-07 — End: 2022-05-07
  Filled 2022-05-07: qty 20, 8d supply, fill #0

## 2022-05-12 ENCOUNTER — Other Ambulatory Visit (HOSPITAL_COMMUNITY): Payer: Self-pay

## 2022-05-13 ENCOUNTER — Other Ambulatory Visit (HOSPITAL_COMMUNITY): Payer: Self-pay

## 2022-05-13 MED ORDER — OXYCODONE HCL 5 MG PO TABS
5.0000 mg | ORAL_TABLET | Freq: Three times a day (TID) | ORAL | 0 refills | Status: DC | PRN
  Filled 2022-05-13: qty 90, 30d supply, fill #0

## 2022-05-15 ENCOUNTER — Other Ambulatory Visit (HOSPITAL_COMMUNITY): Payer: Self-pay

## 2022-05-15 MED ORDER — OXYCODONE HCL 5 MG PO TABS
5.0000 mg | ORAL_TABLET | Freq: Three times a day (TID) | ORAL | 0 refills | Status: AC | PRN
  Filled 2022-05-15: qty 15, 5d supply, fill #0

## 2022-06-10 ENCOUNTER — Other Ambulatory Visit (HOSPITAL_COMMUNITY): Payer: Self-pay

## 2022-06-25 ENCOUNTER — Other Ambulatory Visit (HOSPITAL_COMMUNITY): Payer: Self-pay

## 2022-06-30 ENCOUNTER — Other Ambulatory Visit (HOSPITAL_COMMUNITY): Payer: Self-pay

## 2022-07-04 DIAGNOSIS — M25521 Pain in right elbow: Secondary | ICD-10-CM | POA: Diagnosis not present

## 2022-07-09 ENCOUNTER — Ambulatory Visit (INDEPENDENT_AMBULATORY_CARE_PROVIDER_SITE_OTHER): Payer: 59 | Admitting: Dermatology

## 2022-07-09 DIAGNOSIS — L853 Xerosis cutis: Secondary | ICD-10-CM

## 2022-07-09 DIAGNOSIS — B079 Viral wart, unspecified: Secondary | ICD-10-CM

## 2022-07-09 DIAGNOSIS — L219 Seborrheic dermatitis, unspecified: Secondary | ICD-10-CM

## 2022-07-09 DIAGNOSIS — L578 Other skin changes due to chronic exposure to nonionizing radiation: Secondary | ICD-10-CM | POA: Diagnosis not present

## 2022-07-09 DIAGNOSIS — D3611 Benign neoplasm of peripheral nerves and autonomic nervous system of face, head, and neck: Secondary | ICD-10-CM

## 2022-07-09 DIAGNOSIS — L719 Rosacea, unspecified: Secondary | ICD-10-CM

## 2022-07-09 DIAGNOSIS — L821 Other seborrheic keratosis: Secondary | ICD-10-CM

## 2022-07-09 DIAGNOSIS — D369 Benign neoplasm, unspecified site: Secondary | ICD-10-CM

## 2022-07-09 MED ORDER — PIMECROLIMUS 1 % EX CREA: TOPICAL_CREAM | Freq: Two times a day (BID) | CUTANEOUS | 1 refills | Status: AC

## 2022-07-09 NOTE — Patient Instructions (Addendum)
When flared for rosacea take doxycycline 100 mg tab by mouth twice daily until calm then go back to doxycycline 40 mg by mouth daily.  Doxycycline should be taken with food to prevent nausea. Do not lay down for 30 minutes after taking. Be cautious with sun exposure and use good sun protection while on this medication. Pregnant women should not take this medication.    For itchy ears  Continue triamcinolone as needed for flares up to 2 weeks  Start Elidil cream - apply topically twice daily to scaly areas of ears.  Topical steroids (such as triamcinolone, fluocinolone, fluocinonide, mometasone, clobetasol, halobetasol, betamethasone, hydrocortisone) can cause thinning and lightening of the skin if they are used for too long in the same area. Your physician has selected the right strength medicine for your problem and area affected on the body. Please use your medication only as directed by your physician to prevent side effects.   Your prescription was sent to [facility] in [place]. A representative from [facility] will contact you within 3 business hours to verify your address and insurance information to schedule a free delivery. If for any reason you do not receive a phone call from them, please reach out to them. Their phone number is [phone] and their hours are Monday-Friday 9:00 am-5:00 pm.       Cryotherapy Aftercare  Wash gently with soap and water everyday.   Apply Vaseline and Band-Aid daily until healed.    Cantharidin Plus is a blistering agent that comes from a beetle.  It needs to be washed off in about 4 hours after application.  Although it is painless when applied in office, it may cause symptoms of mild pain and burning several hours later.  Treated areas will swell and turn red, and blisters may form.  Vaseline and a bandaid may be applied until wound has healed.  Once healed, the skin may remain temporarily discolored.  It can take weeks to months for  pigmentation to return to normal.  Advised to wash off with soap and water in 4 hours or sooner if it becomes tender before then.     If You Need Anything After Your Visit  If you have any questions or concerns for your doctor, please call our main line at [phone] and press option 4 to reach your doctor's medical assistant. If no one answers, please leave a voicemail as directed and we will return your call as soon as possible. Messages left after 4 pm will be answered the following business day.   You may also send [place] a message via [place]. We typically respond to MyChart messages within 1-2 business days.  For prescription refills, please ask your pharmacy to contact our office. Our fax number is [phone].  If you have an urgent issue when the clinic is closed that cannot wait until the next business day, you can page your doctor at the number below.    Please note that while we do our best to be available for urgent issues outside of office hours, we are not available 24/7.   If you have an urgent issue and are unable to reach [place], you may choose to seek medical care at your doctor's office, retail clinic, urgent care center, or emergency room.  If you have a medical emergency, please immediately call 911 or go to the emergency department.  Pager Numbers  - Dr. Nehemiah Massed: [phone]  - Dr. Laurence Ferrari: [phone]  - Dr. Nicole Kindred: [phone]  In the  event of inclement weather, please call our main line at [phone] for an update on the status of any delays or closures.  Dermatology Medication Tips: Please keep the boxes that topical medications come in in order to help keep track of the instructions about where and how to use these. Pharmacies typically print the medication instructions only on the boxes and not directly on the medication tubes.   If your medication is too expensive, please contact our office at [phone] option 4 or send [place] a message through [place].    We are unable to tell what your co-pay for medications will be in advance as this is different depending on your insurance coverage. However, we may be able to find a substitute medication at lower cost or fill out paperwork to get insurance to cover a needed medication.   If a prior authorization is required to get your medication covered by your insurance company, please allow [place] 1-2 business days to complete this process.  Drug prices often vary depending on where the prescription is filled and some pharmacies may offer cheaper prices.  The website www.goodrx.com contains coupons for medications through different pharmacies. The prices here do not account for what the cost may be with help from insurance (it may be cheaper with your insurance), but the website can give you the price if you did not use any insurance.  - You can print the associated coupon and take it with your prescription to the pharmacy.  - You may also stop by our office during regular business hours and pick up a GoodRx coupon card.  - If you need your prescription sent electronically to a different pharmacy, notify our office through [facility] or by phone at [phone] option 4.     Si Usted Necesita Algo Despus de Su Visita  Tambin puede enviarnos un mensaje a travs de Pharmacist, community. Por lo general respondemos a los mensajes de MyChart en el transcurso de 1 a 2 das hbiles.  Para renovar recetas, por favor pida a su farmacia que se ponga en contacto con nuestra oficina. Harland Dingwall de fax es [place] [phone].  Si tiene un asunto urgente cuando la clnica est cerrada y que no puede esperar hasta el siguiente da hbil, puede llamar/localizar a su doctor(a) al nmero que aparece a continuacin.   Por favor, tenga en cuenta que aunque hacemos todo lo posible para estar disponibles para asuntos urgentes fuera del horario de [place], no estamos disponibles las 24 horas del da, los 7 das de la [place].    Si tiene un problema urgente y no puede comunicarse con nosotros, puede optar por buscar atencin mdica  en el consultorio de su doctor(a), en una clnica privada, en un centro de atencin urgente o en una sala de emergencias.  Si tiene Engineering geologist, por favor llame inmediatamente al 911 o vaya a la sala de emergencias.  Nmeros de bper  - Dr. Nehemiah Massed: [phone]  - Dra. Moye: [phone]  - Dra. Nicole Kindred: [phone]  En caso de inclemencias del [place], por favor llame a Johnsie Kindred principal al [phone] para una actualizacin sobre el [place] de cualquier retraso o cierre.  Consejos para la medicacin en dermatologa: Por favor, guarde las cajas en las que vienen los medicamentos de uso tpico para ayudarle a seguir las instrucciones sobre dnde y cmo usarlos. Las farmacias generalmente imprimen las instrucciones del medicamento slo en las cajas y no directamente en los tubos del [place].   Si su  medicamento es [place] caro, por favor, pngase en contacto con Zigmund Daniel llamando al [phone] y presione la opcin 4 o envenos un mensaje a travs de Pharmacist, community.   No podemos decirle cul ser su copago por los medicamentos por adelantado ya que esto es diferente dependiendo de la cobertura de su seguro. Sin embargo, es posible que podamos encontrar un medicamento sustituto a Electrical engineer un formulario para que el seguro cubra el medicamento que se considera necesario.   Si se requiere una autorizacin previa para que su compaa de seguros [place] su medicamento, por favor permtanos de 1 a 2 das hbiles para completar este proceso.  Los precios de los medicamentos varan con frecuencia dependiendo del Environmental consultant de dnde se surte la receta y alguna farmacias pueden ofrecer precios ms baratos.  El sitio web www.goodrx.com tiene cupones para medicamentos de Airline pilot. Los precios aqu no tienen en cuenta lo que podra costar con la ayuda del seguro  (puede ser ms barato con su seguro), pero el sitio web puede darle el precio si no utiliz Research scientist (physical sciences).  - Puede imprimir el cupn correspondiente y llevarlo con su receta a la farmacia.  - Tambin puede pasar por nuestra oficina durante el horario de atencin regular y Charity fundraiser una tarjeta de cupones de GoodRx.  - Si necesita que su receta se enve electrnicamente a una farmacia diferente, informe a nuestra oficina a travs de MyChart de [place] o por telfono llamando al [phone] y presione la opcin 4.

## 2022-07-09 NOTE — Progress Notes (Signed)
Follow-Up Visit   Subjective  Kimberly Figueroa is a 66 y.o. female who presents for the following: Rosacea (Mole at inside of left forearm that is concerned growing and changing  dry skin at right elbow and back that ar new, roscaea flared , itchy area was prescribed something but doesn't help ) and Skin Problem (Concerned about spot at left forearm and looks like growing and changing. Itchy areas at ears prescribed ketoconazole cream and triamcinolone reports not improving. Dry areas at elbows and back. ).  Joints and elbows hurt with stiffness but does not last long.  The following portions of the chart were reviewed this encounter and updated as appropriate:  Tobacco  Allergies  Meds  Problems  Med Hx  Surg Hx  Fam Hx     Review of Systems: No other skin or systemic complaints except as noted in HPI or Assessment and Plan.   Objective  Well appearing patient in no apparent distress; mood and affect are within normal limits.  A focused examination was performed including face, right shoulder, back, inside ears. . Relevant physical exam findings are noted in the Assessment and Plan.  face Mid-face erythema and telangiectasias with few inflammatory papules  left antecubital fossa x 1 Verrucous papules -- Discussed viral etiology and contagion.   b/l ears Scaly pink plaques   Nose Smooth small pink papule without features suspicious for malignancy on dermoscopy    Assessment & Plan  Rosacea face  Rosacea is a chronic progressive skin condition usually affecting the face of adults, causing redness and/or acne bumps. It is treatable but not curable. It sometimes affects the eyes (ocular rosacea) as well. It may respond to topical and/or systemic medication and can flare with stress, sun exposure, alcohol, exercise and some foods.  Daily application of broad spectrum spf 30+ sunscreen to face is recommended to reduce flares.  Chronic and persistent condition with duration  or expected duration over one year. Condition is bothersome/symptomatic for patient. Currently flared.  Continue Oracea '40mg'$  once daily as directed. (Can switch back to doxycycline 20 mg bid if she does not feel the Oracea 40 mg works better). Increase to Doxycycline '100mg'$  bid until calm as needed for worse flares.  Continue Zilxi at bedtime, apply 2nd. Continue Tretinoin 0.025% cream at bedtime, apply 1st.  Be cautious with new skin care products as they can cause a flare.   Doxycycline should be taken with food to prevent nausea. Do not lay down for 30 minutes after taking. Be cautious with sun exposure and use good sun protection while on this medication. Pregnant women should not take this medication.    Topical retinoid medications like tretinoin/Retin-A, adapalene/Differin, tazarotene/Fabior, and Epiduo/Epiduo Forte can cause dryness and irritation when first started. Only apply a pea-sized amount to the entire affected area. Avoid applying it around the eyes, edges of mouth and creases at the nose. If you experience irritation, use a good moisturizer first and/or apply the medicine less often. If you are doing well with the medicine, you can increase how often you use it until you are applying every night. Be careful with sun protection while using this medication as it can make you sensitive to the sun. This medicine should not be used by pregnant women.   Related Medications Minocycline HCl Micronized (ZILXI) 1.5 % FOAM Apply a thin layer to the face QHS.  doxycycline (ORACEA) 40 MG capsule TAKE ONE CAPSULE BY MOUTH DAILY. TAKE WITH FOOD.  Viral warts, unspecified  type left antecubital fossa x 1  Viral Wart (HPV) Counseling  Discussed viral / HPV (Human Papilloma Virus) etiology and risk of spread /infectivity to other areas of body as well as to other people.  Multiple treatments and methods may be required to clear warts and it is possible treatment may not be successful.  Treatment  risks include discoloration; scarring and there is still potential for wart recurrence.  Prior to procedure, discussed risks of blister formation, small wound, skin dyspigmentation, or rare scar following cryotherapy. Recommend Vaseline ointment to treated areas while healing.  Squaric Acid 3% applied to warts today. Prior to application reviewed risk of inflammation and irritation.  Cantharidin Plus is a blistering agent that comes from a beetle.  It needs to be washed off in about 4 hours after application.  Although it is painless when applied in office, it may cause symptoms of mild pain and burning several hours later.  Treated areas will swell and turn red, and blisters may form.  Vaseline and a bandaid may be applied until wound has healed.  Once healed, the skin may remain temporarily discolored.  It can take weeks to months for pigmentation to return to normal.  Advised to wash off with soap and water in 4 hours or sooner if it becomes tender before then.   Destruction of lesion - left antecubital fossa x 1  Destruction method: cryotherapy   Informed consent: discussed and consent obtained   Lesion destroyed using liquid nitrogen: Yes   Cryotherapy cycles:  2 Outcome: patient tolerated procedure well with no complications   Post-procedure details: wound care instructions given   Additional details:  Squaric Acid 3% applied to warts today. Prior to application reviewed risk of inflammation and irritation.   Seborrheic dermatitis b/l ears  Seborrheic Dermatitis  -  is a chronic persistent rash characterized by pinkness and scaling most commonly of the mid face but also can occur on the scalp (dandruff), ears; mid chest, mid back and groin.  It tends to be exacerbated by stress and cooler weather.  People who have neurologic disease may experience new onset or exacerbation of existing seborrheic dermatitis.  The condition is not curable but treatable and can be controlled.  Chronic  and persistent condition with duration or expected duration over one year. Condition is bothersome/symptomatic for patient. Currently flared.  Continue prn tmc 0.1 % as needed for flares twice daily for 2 weeks  D/c ketoconazole   Tried and failed ketoconazole and triamcinolone.   Start pimecrolimus 1 % cream - apply topically bid to aa of ears for scale.   Discussed alternative treatments such as dermasmooth oil.    pimecrolimus (ELIDEL) 1 % cream - b/l ears Apply topically 2 (two) times daily. Apply to scaly areas at ears  Related Medications ketoconazole (NIZORAL) 2 % cream Apply to ears twice daily until clear  triamcinolone cream (KENALOG) 0.1 % Apply twice daily to ears up to 2 weeks as needed for itching.  Angiofibroma Nose  Favored over acne/rosacea.  Benign-appearing.  Observation.  Call clinic for new or changing lesions.    Seborrheic Keratoses Right arm and back  - Stuck-on, waxy, tan-brown papules and/or plaques  - Benign-appearing - Discussed benign etiology and prognosis. - Observe - Call for any changes  Xerosis - diffuse xerotic patches - recommend gentle, hydrating skin care - gentle skin care handout given  Actinic Damage - chronic, secondary to cumulative UV radiation exposure/sun exposure over time - diffuse scaly erythematous  macules with underlying dyspigmentation - Recommend daily broad spectrum sunscreen SPF 30+ to sun-exposed areas, reapply every 2 hours as needed.  - Recommend staying in the shade or wearing long sleeves, sun glasses (UVA+UVB protection) and wide brim hats (4-inch brim around the entire circumference of the hat). - Call for new or changing lesions.   Return for schedule for after december 28 for tbse. I, Ruthell Rummage, CMA, am acting as scribe for Forest Gleason, MD.  Documentation: I have reviewed the above documentation for accuracy and completeness, and I agree with the above.  Forest Gleason, MD

## 2022-07-10 ENCOUNTER — Encounter: Payer: Self-pay | Admitting: Dermatology

## 2022-07-30 ENCOUNTER — Other Ambulatory Visit (HOSPITAL_COMMUNITY): Payer: Self-pay

## 2022-07-31 ENCOUNTER — Telehealth: Payer: No Typology Code available for payment source | Admitting: Adult Health

## 2022-08-08 ENCOUNTER — Other Ambulatory Visit: Payer: Self-pay | Admitting: Adult Health

## 2022-08-12 ENCOUNTER — Other Ambulatory Visit: Payer: Self-pay

## 2022-08-12 ENCOUNTER — Other Ambulatory Visit (HOSPITAL_COMMUNITY): Payer: Self-pay

## 2022-08-12 MED ORDER — ZOLPIDEM TARTRATE 10 MG PO TABS
5.0000 mg | ORAL_TABLET | Freq: Every evening | ORAL | 1 refills | Status: DC | PRN
  Filled 2022-08-12: qty 90, 90d supply, fill #0

## 2022-08-12 NOTE — Telephone Encounter (Signed)
Pt has appt 08-2022. Last seen 12/2021.

## 2022-08-21 ENCOUNTER — Ambulatory Visit: Payer: 59 | Admitting: Dermatology

## 2022-09-09 ENCOUNTER — Other Ambulatory Visit (HOSPITAL_COMMUNITY): Payer: Self-pay

## 2022-09-09 ENCOUNTER — Telehealth (INDEPENDENT_AMBULATORY_CARE_PROVIDER_SITE_OTHER): Payer: 59 | Admitting: Adult Health

## 2022-09-09 DIAGNOSIS — F5104 Psychophysiologic insomnia: Secondary | ICD-10-CM | POA: Diagnosis not present

## 2022-09-09 DIAGNOSIS — R400 Somnolence: Secondary | ICD-10-CM | POA: Diagnosis not present

## 2022-09-09 MED ORDER — ZOLPIDEM TARTRATE 10 MG PO TABS
10.0000 mg | ORAL_TABLET | Freq: Every evening | ORAL | 1 refills | Status: DC | PRN
Start: 2022-09-09 — End: 2023-03-31
  Filled 2022-09-09 – 2022-11-11 (×3): qty 90, 90d supply, fill #0
  Filled 2023-02-07: qty 90, 90d supply, fill #1

## 2022-09-09 NOTE — Progress Notes (Signed)
PATIENT: Kimberly Figueroa DOB: [birth year]  REASON FOR VISIT: follow up HISTORY FROM: patient  Virtual Visit via Video Note  I connected with [name] on 09/09/22 at  1:30 PM EST by a video enabled telemedicine application located remotely at [facility] and verified that I am speaking with the correct person using two identifiers who was located at their own home.   I discussed the limitations of evaluation and management by telemedicine and the availability of in person appointments. The patient expressed understanding and agreed to proceed.   PATIENT: Kimberly Figueroa DOB: 04-Sep-1955  REASON FOR VISIT: follow up HISTORY FROM: patient  HISTORY OF PRESENT ILLNESS: Today 09/09/22:  Kimberly Figueroa is a 67 y.o. female with a history of Insomnia and daytime sleepiness. Returns today for follow-up.  She is now taking a full tablet of Ambien each night.  She states that she is sleeping the best that she has sleep.  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  She states that she does have Provigil but typically only uses it once or twice a month for longer drives.  She returns today for an evaluation.     5/23/23Ms. Kimberly Figueroa is a 67 year old female with a history of insomnia and daytime sleepiness.  She returns today for follow-up.  She reports that she typically takes half a tablet of Ambien most nights.  On occasion she will have to take the extra half of Ambien.  She continues to use Provigil during the day.  But she primarily uses it only when she is driving.  07/16/21: Ms. Kimberly Figueroa is a 67 year old female with a history of insomnia and daytime sleepiness.  She returns today for follow-up.  She continues on Ambien and Provigil.  She states that typically she can take a half a tablet of Ambien and that works well.  She states that her father recently passed and she has been using 1 full tablet of Ambien but plans  to reduce her dose in the future.  She does not take Provigil daily.  Continues to only use it if she has to drop early in the morning.  01/22/21: Ms. Kimberly Figueroa is a 67 year old female with a history of insomnia and daytime sleepiness.  She returns today for follow-up.  She states that she typically takes a half a tablet of Ambien nightly.  On occasion she does have to take the other half.  She reports on the rare occasion she will have to go ahead and take the full tablet at bedtime.  She states that she has Provigil but probably is only used 3 times in the last month.  Overall she feels that this is working well for her.  She returns today for an evaluation.  HISTORY Kimberly Figueroa is a 67 y.o. female who has been followed in this office for insomnia and daytime sleepiness.  She was initially scheduled for MyChart video visit however she was unable to hear me so we transferred to a telephone visit.  She states that over the last month her stress levels have increased.  Therefore she has been having a difficult time sleeping and has been using 1 full tablet of Ambien.  She also reports that she has been having low back pain and is seeing pain management.  She states this also disrupts her sleep.  She states that she has Provigil but probably use it 5 to 10 days out of a month.  She  typically only uses it when she has early morning meetings.  She returns today for follow-up.    REVIEW OF SYSTEMS: Out of a complete 14 system review of symptoms, the patient complains only of the following symptoms, and all other reviewed systems are negative.  See HPI  ALLERGIES: Allergies  Allergen Reactions   Darvon [Propoxyphene] Nausea Only   Vicodin [Hydrocodone-Acetaminophen] Itching    HOME MEDICATIONS: Outpatient Medications Prior to Visit  Medication Sig Dispense Refill   citalopram (CELEXA) 40 MG tablet TAKE 1 TABLET BY MOUTH DAILY. 90 tablet 1   COVID-19 At Home Antigen Test (CARESTART COVID-19  HOME TEST) KIT Use as directed 2 kit 0   cyclobenzaprine (FLEXERIL) 5 MG tablet Take 1 tablet by mouth 2 times daily 180 tablet 3   cyclobenzaprine (FLEXERIL) 5 MG tablet Take 1 tablet by mouth 2 times every day (90 day supply) 180 tablet 3   doxycycline (ORACEA) 40 MG capsule TAKE ONE CAPSULE BY MOUTH DAILY. TAKE WITH FOOD. 30 capsule 3   ketoconazole (NIZORAL) 2 % cream Apply to ears twice daily until clear 30 g 2   Lifitegrast (XIIDRA) 5 % SOLN Instill 1 drop into both eyes every 12 hours as directed 180 each 3   Lifitegrast (XIIDRA) 5 % SOLN Place 1 drop into both eyes 2 times a day approximately 12 hours apart 180 each 3   Minocycline HCl Micronized (ZILXI) 1.5 % FOAM Apply a thin layer to the face QHS. 30 g 2   modafinil (PROVIGIL) 100 MG tablet TAKE 1 TABLET (100 MG TOTAL) BY MOUTH DAILY. 90 tablet 1   Multiple Vitamins-Minerals (MULTIVITAMIN GUMMIES ADULTS PO) Take by mouth. VitaCraves     mupirocin ointment (BACTROBAN) 2 % Apply to wound daily as directed until healed. 22 g 0   oxyCODONE (OXY IR/ROXICODONE) 5 MG immediate release tablet Take 1 tablet by mouth every 8 hours as needed 90 tablet 0   oxyCODONE (OXY IR/ROXICODONE) 5 MG immediate release tablet Take 1 tablet (5 mg total) by mouth every 8 (eight) hours as needed. 15 tablet 0   pimecrolimus (ELIDEL) 1 % cream Apply topically 2 (two) times daily. Apply to scaly areas at ears 30 g 1   Progesterone Micronized 10 % CREA Apply 75m topically every day. 1 g 1   tretinoin (RETIN-A) 0.025 % cream Apply a pea sized amount to the entire face at bedtime 45 g 3   triamcinolone cream (KENALOG) 0.1 % Apply twice daily to ears up to 2 weeks as needed for itching. 30 g 1   UNABLE TO FIND 2 (two) times daily as needed. Skin Medicinals(cream):  Azelaic Acid 15%, Ivermectin 1%, Metronidazole 1%     zolpidem (AMBIEN) 10 MG tablet Take 0.5-1 tablets (5-10 mg total) by mouth at bedtime as needed for sleep. 90 tablet 1   No facility-administered  medications prior to visit.    PAST MEDICAL HISTORY: Past Medical History:  Diagnosis Date   Actinic keratosis 08/21/2021   Left medial calf.   Endometriosis    History of frequent urinary tract infections    s/p bladder dilatation (11/05)   Hypersomnia, recurrent 06/28/2014   ITP (idiopathic thrombocytopenic purpura)    Pneumonia    Rosacea     PAST SURGICAL HISTORY: Past Surgical History:  Procedure Laterality Date   bladder dilatation  11/05   CATARACT EXTRACTION W/PHACO Right 12/13/2019   Procedure: CATARACT EXTRACTION PHACO AND INTRAOCULAR LENS PLACEMENT (IOC) RIGHT VIVITY TORIC LENS 4.35 00:30.2;  Surgeon: Birder Robson, MD;  Location: [place];  Service: Ophthalmology;  Laterality: Right;   CATARACT EXTRACTION W/PHACO Left 01/10/2020   Procedure: CATARACT EXTRACTION PHACO AND INTRAOCULAR LENS PLACEMENT (IOC) LEFT VIVITY TORIC LENS 6.19  00:32.8;  Surgeon: Birder Robson, MD;  Location: [place];  Service: Ophthalmology;  Laterality: Left;   CESAREAN SECTION  1993   COLONOSCOPY     laproscopic surgery  10/06   found to have endometriosis   SHOULDER SURGERY Left 2008   [place]  02/07    FAMILY HISTORY: Family History  Problem Relation Age of Onset   Alcohol abuse Mother    Varicose Veins Mother    Diabetes Father    Hearing loss Father    Heart disease Father    Crohn's disease Father    Crohn's disease Paternal Grandfather    Breast cancer Neg Hx    Colon cancer Neg Hx     SOCIAL HISTORY: Social History   Socioeconomic History   Marital status: Married    Spouse name: Gwyndolyn Saxon   Number of children: 2   Years of education: college   Highest education level: Not on file  Occupational History   Occupation: Advertising account planner. Director    Employer: [employer]  Tobacco Use   Smoking status: Never   Smokeless tobacco: Never  Vaping Use   Vaping Use: Never used  Substance and  Sexual Activity   Alcohol use: Yes    Alcohol/week: 2.0 standard drinks of alcohol    Types: 2 Glasses of wine per week   Drug use: No   Sexual activity: Not on file  Other Topics Concern   Not on file  Social History Narrative   Patient consumes 6-8 cups tea daily, is right handed   Social Determinants of Health   Financial Resource Strain: Not on file  Food Insecurity: Not on file  Transportation Needs: Not on file  Physical Activity: Not on file  Stress: Not on file  Social Connections: Not on file  Intimate Partner Violence: Not on file      PHYSICAL EXAM Generalized: Well developed, in no acute distress   Neurological examination  Mentation: Alert oriented to time, place, history taking. Follows all commands speech and language fluent Cranial nerve II-XII:. Facial symmetry noted.  Reflexes: UTA  DIAGNOSTIC DATA (LABS, IMAGING, TESTING) - I reviewed patient records, labs, notes, testing and imaging myself where available.  Lab Results  Component Value Date   WBC 5.4 04/23/2021   HGB 12.9 04/23/2021   HCT 39.7 04/23/2021   MCV 94.7 04/23/2021   PLT 176.0 04/23/2021      Component Value Date/Time   NA 142 04/23/2021 1433   K 3.7 04/23/2021 1433   CL 104 04/23/2021 1433   CO2 29 04/23/2021 1433   GLUCOSE 73 04/23/2021 1433   BUN 11 04/23/2021 1433   CREATININE 0.69 04/23/2021 1433   CALCIUM 9.0 04/23/2021 1433   PROT 6.7 04/23/2021 1433   ALBUMIN 4.3 04/23/2021 1433   AST 18 04/23/2021 1433   ALT 14 04/23/2021 1433   ALKPHOS 76 04/23/2021 1433   BILITOT 0.4 04/23/2021 1433   Lab Results  Component Value Date   CHOL 186 04/23/2021   HDL 77.10 04/23/2021   LDLCALC 95 04/23/2021   TRIG 72.0 04/23/2021   CHOLHDL 2 04/23/2021   Lab Results  Component Value Date   HGBA1C 6.0 04/23/2021   No  results found for: "VITAMINB12" Lab Results  Component Value Date   TSH 1.81 04/23/2021      ASSESSMENT AND PLAN 67 y.o. year old female  has a past  medical history of Actinic keratosis (08/21/2021), Endometriosis, History of frequent urinary tract infections, Hypersomnia, recurrent (06/28/2014), ITP (idiopathic thrombocytopenic purpura), Pneumonia, and Rosacea. here with:  1.  Insomnia 2.  Daytime sleepiness  --Continue Ambien 10 mg at bedtime as needed -- Continue Provigil 100 mg 1 tablet daily if needed -- Advised if symptoms worsen or she develops new symptoms they should let us know -- Follow-up in 6 months or sooner if needed   . Ward Givens, MSN, NP-C 09/09/2022, 1:31 PM [facility] [address] [place], [place] [address] [phone]

## 2022-09-10 ENCOUNTER — Other Ambulatory Visit (HOSPITAL_COMMUNITY): Payer: Self-pay

## 2022-09-11 ENCOUNTER — Other Ambulatory Visit: Payer: Self-pay | Admitting: Dermatology

## 2022-09-11 DIAGNOSIS — L719 Rosacea, unspecified: Secondary | ICD-10-CM

## 2022-09-12 ENCOUNTER — Telehealth: Payer: No Typology Code available for payment source | Admitting: Adult Health

## 2022-09-24 ENCOUNTER — Encounter: Payer: Self-pay | Admitting: Dermatology

## 2022-09-24 ENCOUNTER — Ambulatory Visit: Payer: 59 | Admitting: Dermatology

## 2022-09-24 VITALS — BP 124/65 | HR 82

## 2022-09-24 DIAGNOSIS — L578 Other skin changes due to chronic exposure to nonionizing radiation: Secondary | ICD-10-CM | POA: Diagnosis not present

## 2022-09-24 DIAGNOSIS — L821 Other seborrheic keratosis: Secondary | ICD-10-CM | POA: Diagnosis not present

## 2022-09-24 DIAGNOSIS — L739 Follicular disorder, unspecified: Secondary | ICD-10-CM | POA: Diagnosis not present

## 2022-09-24 DIAGNOSIS — L814 Other melanin hyperpigmentation: Secondary | ICD-10-CM | POA: Diagnosis not present

## 2022-09-24 DIAGNOSIS — D239 Other benign neoplasm of skin, unspecified: Secondary | ICD-10-CM

## 2022-09-24 DIAGNOSIS — B351 Tinea unguium: Secondary | ICD-10-CM

## 2022-09-24 DIAGNOSIS — L570 Actinic keratosis: Secondary | ICD-10-CM

## 2022-09-24 DIAGNOSIS — Z1283 Encounter for screening for malignant neoplasm of skin: Secondary | ICD-10-CM | POA: Diagnosis not present

## 2022-09-24 DIAGNOSIS — D492 Neoplasm of unspecified behavior of bone, soft tissue, and skin: Secondary | ICD-10-CM

## 2022-09-24 MED ORDER — FLUOROURACIL 5 % EX CREA: TOPICAL_CREAM | CUTANEOUS | 1 refills | Status: DC

## 2022-09-24 MED ORDER — CALCIPOTRIENE 0.005 % EX CREA: TOPICAL_CREAM | CUTANEOUS | 1 refills | Status: DC

## 2022-09-24 NOTE — Patient Instructions (Addendum)
Stop Skin Medicinals topical. Let toe nail grow out. Will do culture at next visit.   Start Fluorouracil 5% cream twice daily for 7 days to left lower leg, right ankle and chest. Apply first. Start Calcipotriene cream twice daily for 7 days to left lower leg, right ankle and chest. Apply second.   Reviewed course of treatment and expected reaction.  Patient advised to expect inflammation and crusting and advised that erosions are possible.  Patient advised to be diligent with sun protection during and after treatment. Counseled to keep medication out of reach of children and pets.    Melanoma ABCDEs  Melanoma is the most dangerous type of skin cancer, and is the leading cause of death from skin disease.  You are more likely to develop melanoma if you: Have light-colored skin, light-colored eyes, or red or blond hair Spend a lot of time in the sun Tan regularly, either outdoors or in a tanning bed Have had blistering sunburns, especially during childhood Have a close family member who has had a melanoma Have atypical moles or large birthmarks  Early detection of melanoma is key since treatment is typically straightforward and cure rates are extremely high if we catch it early.   The first sign of melanoma is often a change in a mole or a new dark spot.  The ABCDE system is a way of remembering the signs of melanoma.  A for asymmetry:  The two halves do not match. B for border:  The edges of the growth are irregular. C for color:  A mixture of colors are present instead of an even brown color. D for diameter:  Melanomas are usually (but not always) greater than 72m - the size of a pencil eraser. E for evolution:  The spot keeps changing in size, shape, and color.  Please check your skin once per month between visits. You can use a small mirror in front and a large mirror behind you to keep an eye on the back side or your body.   If you see any new or changing lesions before your next  follow-up, please call to schedule a visit.  Please continue daily skin protection including broad spectrum sunscreen SPF 30+ to sun-exposed areas, reapplying every 2 hours as needed when you're outdoors.   Staying in the shade or wearing long sleeves, sun glasses (UVA+UVB protection) and wide brim hats (4-inch brim around the entire circumference of the hat) are also recommended for sun protection.     Due to recent changes in healthcare laws, you may see results of your pathology and/or laboratory studies on MyChart before the doctors have had a chance to review them. We understand that in some cases there may be results that are confusing or concerning to you. Please understand that not all results are received at the same time and often the doctors may need to interpret multiple results in order to provide you with the best plan of care or course of treatment. Therefore, we ask that you please give u[place]2 business days to thoroughly review all your results before contacting the office for clarification. Should we see a critical lab result, you will be contacted sooner.   If You Need Anything After Your Visit  If you have any questions or concerns for your doctor, please call our main line at [phone] press option 4 to reach your doctor's medical assistant. If no one answers, please leave a voicemail as directed and we will return your call  as soon as possible. Messages left after 4 pm will be answered the following business day.   You may also send [place] a message via [place] [employer]. We typically respond to MyChart messages within 1-2 business days.  For prescription refills, please ask your pharmacy to contact our office. Our fax number is [phone].  If you have an urgent issue when the clinic is closed that cannot wait until the next business day, you can page your doctor at the number below.    Please note that while we do our best to be available for urgent issues outside of office hours,  we are not available 24/7.   If you have an urgent issue and are unable to reach [place], you may choose to seek medical care at your doctor's office, retail clinic, urgent care center, or emergency room.  If you have a medical emergency, please immediately call 911 or go to the emergency department.  Pager Numbers  - Dr. Nehemiah Massed: [phone]  - Dr. Laurence Ferrari: [phone]  - Dr. Nicole Kindred: [phone]  In the event of inclement weather, please call our main line at [phone] for an update on the status of any delays or closures.  Dermatology Medication Tips: Please keep the boxes that topical medications come in in order to help keep track of the instructions about where and how to use these. Pharmacies typically print the medication instructions only on the boxes and not directly on the medication tubes.   If your medication is too expensive, please contact our office at [phone] option 4 or send [place] a message through [place].   We are unable to tell what your co-pay for medications will be in advance as this is different depending on your insurance coverage. However, we may be able to find a substitute medication at lower cost or fill out paperwork to get insurance to cover a needed medication.   If a prior authorization is required to get your medication covered by your insurance company, please allow [place] 1-2 business days to complete this process.  Drug prices often vary depending on where the prescription is filled and some pharmacies may offer cheaper prices.  The website www.goodrx.com contains coupons for medications through different pharmacies. The prices here do not account for what the cost may be with help from insurance (it may be cheaper with your insurance), but the website can give you the price if you did not use any insurance.  - You can print the associated coupon and take it with your prescription to the pharmacy.  - You may also stop by our office during regular  business hours and pick up a GoodRx coupon card.  - If you need your prescription sent electronically to a different pharmacy, notify our office through [employer] or by phone at [phone] option 4.     Si Usted Necesita Algo Despus de Su Visita  Tambin puede enviarnos un mensaje a travs de Pharmacist, community. Por lo general respondemos a los mensajes de MyChart en el transcurso de 1 a 2 das hbiles.  Para renovar recetas, por favor pida a su farmacia que se ponga en contacto con nuestra oficina. Harland Dingwall de fax es [place] [phone].  Si tiene un asunto urgente cuando la clnica est cerrada y que no puede esperar hasta el siguiente da hbil, puede llamar/localizar a su doctor(a) al nmero que aparece a continuacin.   Por favor, tenga en cuenta que aunque hacemos todo lo posible para estar disponibles para asuntos urgentes fuera del  horario de oficina, no estamos disponibles las 24 horas del da, los 7 das de la [place].   Si tiene un problema urgente y no puede comunicarse con nosotros, puede optar por buscar atencin mdica  en el consultorio de su doctor(a), en una clnica privada, en un centro de atencin urgente o en una sala de emergencias.  Si tiene Engineering geologist, por favor llame inmediatamente al 911 o vaya a la sala de emergencias.  Nmeros de bper  - Dr. Nehemiah Massed: [phone]  - Dra. Moye: [phone]  - Dra. Nicole Kindred: [phone]  En caso de inclemencias del [place], por favor llame a Johnsie Kindred principal al [phone] para una actualizacin sobre el [place] de cualquier retraso o cierre.  Consejos para la medicacin en dermatologa: Por favor, guarde las cajas en las que vienen los medicamentos de uso tpico para ayudarle a seguir las instrucciones sobre dnde y cmo usarlos. Las farmacias generalmente imprimen las instrucciones del medicamento slo en las cajas y no directamente en los tubos del [place].   Si su medicamento es muy caro, por  favor, pngase en contacto con Zigmund Daniel llamando al [phone] y presione la opcin 4 o envenos un mensaje a travs de Pharmacist, community.   No podemos decirle cul ser su copago por los medicamentos por adelantado ya que esto es diferente dependiendo de la cobertura de su seguro. Sin embargo, es posible que podamos encontrar un medicamento sustituto a Electrical engineer un formulario para que el seguro cubra el medicamento que se considera necesario.   Si se requiere una autorizacin previa para que su compaa de seguros [place] su medicamento, por favor permtanos de 1 a 2 das hbiles para completar este proceso.  Los precios de los medicamentos varan con frecuencia dependiendo del Environmental consultant de dnde se surte la receta y alguna farmacias pueden ofrecer precios ms baratos.  El sitio web www.goodrx.com tiene cupones para medicamentos de Airline pilot. Los precios aqu no tienen en cuenta lo que podra costar con la ayuda del seguro (puede ser ms barato con su seguro), pero el sitio web puede darle el precio si no utiliz Research scientist (physical sciences).  - Puede imprimir el cupn correspondiente y llevarlo con su receta a la farmacia.  - Tambin puede pasar por nuestra oficina durante el horario de atencin regular y Charity fundraiser una tarjeta de cupones de GoodRx.  - Si necesita que su receta se enve electrnicamente a una farmacia diferente, informe a nuestra oficina a travs de MyChart de [place] o por telfono llamando al [phone] y presione la opcin 4.     Your prescription was sent to [facility] in [place]. A representative from [facility] will contact you within 2 business hours to verify your address and insurance information to schedule a free delivery. If for any reason you do not receive a phone call from them, please reach out to them. Their phone number is [phone] and their hours are Monday-Friday 9:00 am-5:00 pm.      Wound Care Instructions  Cleanse wound gently with  soap and water once a day then pat dry with clean gauze. Apply a thin coat of Petrolatum (petroleum jelly, "Vaseline") over the wound (unless you have an allergy to this). We recommend that you use a new, sterile tube of Vaseline. Do not pick or remove scabs. Do not remove the yellow or white "healing tissue" from the base of the wound.  Cover the wound with fresh, clean, nonstick gauze and secure with paper tape. You  may use Band-Aids in place of gauze and tape if the wound is small enough, but would recommend trimming much of the tape off as there is often too much. Sometimes Band-Aids can irritate the skin.  You should call the office for your biopsy report after 1 week if you have not already been contacted.  If you experience any problems, such as abnormal amounts of bleeding, swelling, significant bruising, significant pain, or evidence of infection, please call the office immediately.  FOR ADULT SURGERY PATIENTS: If you need something for pain relief you may take 1 extra strength Tylenol (acetaminophen) AND 2 Ibuprofen ('200mg'$  each) together every 4 hours as needed for pain. (do not take these if you are allergic to them or if you have a reason you should not take them.) Typically, you may only need pain medication for 1 to 3 days.

## 2022-09-24 NOTE — Progress Notes (Signed)
Follow-Up Visit   Subjective  Kimberly Figueroa is a 67 y.o. female who presents for the following: Annual Exam (No personal hx of skin cancer. Hx of AK. Pink spot on left forearm, fairly new. Asymptomatic ).  The patient presents for Total-Body Skin Exam (TBSE) for skin cancer screening and mole check.  The patient has spots, moles and lesions to be evaluated, some may be new or changing and the patient has concerns that these could be cancer.  The following portions of the chart were reviewed this encounter and updated as appropriate:  Tobacco  Allergies  Meds  Problems  Med Hx  Surg Hx  Fam Hx      Review of Systems: No other skin or systemic complaints except as noted in HPI or Assessment and Plan.   Objective  Well appearing patient in no apparent distress; mood and affect are within normal limits.  A full examination was performed including scalp, head, eyes, ears, nose, lips, neck, chest, axillae, abdomen, back, buttocks, bilateral upper extremities, bilateral lower extremities, hands, feet, fingers, toes, fingernails, and toenails. All findings within normal limits unless otherwise noted below.  Left Dorsal Forearm 1.2 cm pink thin plaque       Right Ankle Erythematous thin papules/macules with gritty scale.   Right Great Toe Nail Thickening with discoloration   Left Popliteal Fossa Perifollicular erythematous papule without features suspicious for malignancy on dermoscopy    Assessment & Plan   Lentigines - Scattered tan macules - Due to sun exposure - Benign-appearing, observe - Recommend daily broad spectrum sunscreen SPF 30+ to sun-exposed areas, reapply every 2 hours as needed. - Call for any changes  Seborrheic Keratoses - Stuck-on, waxy, tan-brown papules and/or plaques  - Benign-appearing - Discussed benign etiology and prognosis. - Observe - Call for any changes  Melanocytic Nevi - Tan-brown and/or pink-flesh-colored symmetric macules  and papules - Benign appearing on exam today - Observation - Call clinic for new or changing moles - Recommend daily use of broad spectrum spf 30+ sunscreen to sun-exposed areas.   Hemangiomas - Red papules - Discussed benign nature - Observe - Call for any changes  Actinic Damage with PreCancerous Actinic Keratoses Counseling for Topical Chemotherapy Management: Patient exhibits: - Severe, confluent actinic changes with pre-cancerous actinic keratoses that is secondary to cumulative UV radiation exposure over time - Condition that is severe; chronic, not at goal. - diffuse scaly erythematous macules and papules with underlying dyspigmentation - Discussed Prescription "Field Treatment" topical Chemotherapy for Severe, Chronic Confluent Actinic Changes with Pre-Cancerous Actinic Keratoses Field treatment involves treatment of an entire area of skin that has confluent Actinic Changes (Sun/ Ultraviolet light damage) and PreCancerous Actinic Keratoses by method of PhotoDynamic Therapy (PDT) and/or prescription Topical Chemotherapy agents such as 5-fluorouracil, 5-fluorouracil/calcipotriene, and/or imiquimod.  The purpose is to decrease the number of clinically evident and subclinical PreCancerous lesions to prevent progression to development of skin cancer by chemically destroying early precancer changes that may or may not be visible.  It has been shown to reduce the risk of developing skin cancer in the treated area. As a result of treatment, redness, scaling, crusting, and open sores may occur during treatment course. One or more than one of these methods may be used and may have to be used several times to control, suppress and eliminate the PreCancerous changes. Discussed treatment course, expected reaction, and possible side effects. - Recommend daily broad spectrum sunscreen SPF 30+ to sun-exposed areas, reapply every 2 hours  as needed.  - Staying in the shade or wearing long sleeves, sun  glasses (UVA+UVB protection) and wide brim hats (4-inch brim around the entire circumference of the hat) are also recommended. - Call for new or changing lesions. Start Fluorouracil 5% cream twice daily for 7 days to left lower leg and right ankle.  Start Calcipotriene cream twice daily for 7 days to left lower leg, right ankle. Apply second Repeat treatment with both creams to chest twice a day for 7 days.   Reviewed course of treatment and expected reaction.  Patient advised to expect inflammation and crusting and advised that erosions are possible.  Patient advised to be diligent with sun protection during and after treatment. Counseled to keep medication out of reach of children and pets.   Skin cancer screening performed today.  Dermatofibroma - Firm pink/brown papulenodule with dimple sign - Benign appearing - Call for any changes   Neoplasm of skin Left Dorsal Forearm  Skin / nail biopsy Type of biopsy: tangential   Informed consent: discussed and consent obtained   Anesthesia: the lesion was anesthetized in a standard fashion   Anesthesia comment:  Area prepped with alcohol Anesthetic:  1% lidocaine w/ epinephrine 1-100,000 buffered w/ 8.4% NaHCO3 Instrument used: flexible razor blade   Hemostasis achieved with: pressure, aluminum chloride and electrodesiccation   Outcome: patient tolerated procedure well   Post-procedure details: wound care instructions given   Post-procedure details comment:  Ointment and small bandage applied  Specimen 1 - Surgical pathology Differential Diagnosis: R/O SCCis vs ISK vs Dermatitis  Check Margins: No  AK (actinic keratosis) Right Ankle  Actinic keratoses are precancerous spots that appear secondary to cumulative UV radiation exposure/sun exposure over time. They are chronic with expected duration over 1 year. A portion of actinic keratoses will progress to squamous cell carcinoma of the skin. It is not possible to reliably predict which  spots will progress to skin cancer and so treatment is recommended to prevent development of skin cancer.  Recommend daily broad spectrum sunscreen SPF 30+ to sun-exposed areas, reapply every 2 hours as needed.  Recommend staying in the shade or wearing long sleeves, sun glasses (UVA+UVB protection) and wide brim hats (4-inch brim around the entire circumference of the hat). Call for new or changing lesions.  Start Fluorouracil 5% cream twice daily for 7 days. Apply first. Start Calcipotriene cream twice daily for 7 days. Apply second.      fluorouracil (EFUDEX) 5 % cream - Right Ankle Apply twice daily for 7 days to left lower leg, right ankle and chest. Apply 1st  calcipotriene (DOVONOX) 0.005 % cream - Right Ankle Apply twice daily for 7 days to left lower leg, right ankle and chest. Apply 2nd  Onychomycosis Right Great Toe Nail  Chronic and persistent condition with duration or expected duration over one year. Condition is symptomatic/ bothersome to patient. Not currently at goal.  Previous culture grew Candida guilliermondii 1-2 colonies. Question of whether this could be contaminant vs true cause.   Stop [employer] topical. Let toenail grow out and will repeat clipping for culture. Plan PO therapy pending results.   Folliculitis Left Popliteal Fossa  Recheck at follow up  Call for changes   Return in about 1 year (around 09/25/2023) for TBSE, HxAK, AK Follow Up and Toenail recheck in 2-3 months.  I, Emelia Salisbury, CMA, am acting as scribe for Forest Gleason, MD.  Documentation: I have reviewed the above documentation for accuracy and completeness, and  I agree with the above.  Forest Gleason, MD

## 2022-09-29 ENCOUNTER — Other Ambulatory Visit: Payer: Self-pay | Admitting: Family

## 2022-10-02 ENCOUNTER — Telehealth: Payer: Self-pay

## 2022-10-02 NOTE — Telephone Encounter (Signed)
-----   Message from Alfonso Patten, MD sent at 10/02/2022  5:42 PM EST ----- Skin , left dorsal forearm LICHENOID ACTINIC KERATOSIS --> LN2 at f/u  MAs please call. Thank you!

## 2022-10-02 NOTE — Telephone Encounter (Signed)
Advised patient of results and will check at follow up. Patient states that she is using 5FU and calcipotriene on other spots right now and she would like to use it on this spot also. Advised that this is ok/hd

## 2022-10-07 ENCOUNTER — Encounter: Payer: Self-pay | Admitting: Internal Medicine

## 2022-10-08 ENCOUNTER — Other Ambulatory Visit: Payer: Self-pay

## 2022-10-08 MED ORDER — CITALOPRAM HYDROBROMIDE 40 MG PO TABS
40.0000 mg | ORAL_TABLET | Freq: Every day | ORAL | 1 refills | Status: DC
  Filled 2022-10-08: qty 90, 90d supply, fill #0
  Filled 2023-01-01: qty 90, 90d supply, fill #1

## 2022-10-19 ENCOUNTER — Other Ambulatory Visit: Payer: Self-pay | Admitting: Adult Health

## 2022-10-19 ENCOUNTER — Encounter: Payer: Self-pay | Admitting: Dermatology

## 2022-10-22 MED ORDER — MODAFINIL 100 MG PO TABS
100.0000 mg | ORAL_TABLET | Freq: Every day | ORAL | 1 refills | Status: DC
  Filled 2022-10-22: qty 90, 90d supply, fill #0
  Filled 2023-02-19: qty 90, 90d supply, fill #1

## 2022-10-23 ENCOUNTER — Other Ambulatory Visit (HOSPITAL_COMMUNITY): Payer: Self-pay

## 2022-10-23 ENCOUNTER — Other Ambulatory Visit: Payer: Self-pay

## 2022-10-29 ENCOUNTER — Other Ambulatory Visit (HOSPITAL_COMMUNITY): Payer: Self-pay

## 2022-11-03 ENCOUNTER — Other Ambulatory Visit: Payer: Self-pay

## 2022-11-12 ENCOUNTER — Other Ambulatory Visit: Payer: Self-pay

## 2022-11-12 ENCOUNTER — Other Ambulatory Visit (HOSPITAL_COMMUNITY): Payer: Self-pay

## 2022-11-18 ENCOUNTER — Telehealth: Payer: Self-pay | Admitting: Adult Health

## 2022-11-18 NOTE — Telephone Encounter (Signed)
R/s 7/23 appt- NP on vacation.

## 2022-12-05 ENCOUNTER — Encounter: Payer: Self-pay | Admitting: Dermatology

## 2022-12-08 ENCOUNTER — Other Ambulatory Visit: Payer: Self-pay

## 2022-12-08 DIAGNOSIS — L719 Rosacea, unspecified: Secondary | ICD-10-CM

## 2022-12-08 MED ORDER — ZILXI 1.5 % EX FOAM: CUTANEOUS | 2 refills | Status: AC

## 2022-12-12 DIAGNOSIS — M5416 Radiculopathy, lumbar region: Secondary | ICD-10-CM | POA: Diagnosis not present

## 2022-12-17 ENCOUNTER — Ambulatory Visit: Payer: 59 | Admitting: Dermatology

## 2022-12-24 ENCOUNTER — Other Ambulatory Visit: Payer: Self-pay | Admitting: Dermatology

## 2022-12-24 DIAGNOSIS — L719 Rosacea, unspecified: Secondary | ICD-10-CM

## 2022-12-25 ENCOUNTER — Ambulatory Visit (INDEPENDENT_AMBULATORY_CARE_PROVIDER_SITE_OTHER): Payer: 59 | Admitting: Dermatology

## 2022-12-25 ENCOUNTER — Encounter: Payer: Self-pay | Admitting: Dermatology

## 2022-12-25 DIAGNOSIS — B351 Tinea unguium: Secondary | ICD-10-CM | POA: Diagnosis not present

## 2022-12-25 DIAGNOSIS — Z79899 Other long term (current) drug therapy: Secondary | ICD-10-CM | POA: Diagnosis not present

## 2022-12-25 DIAGNOSIS — L821 Other seborrheic keratosis: Secondary | ICD-10-CM

## 2022-12-25 DIAGNOSIS — L578 Other skin changes due to chronic exposure to nonionizing radiation: Secondary | ICD-10-CM

## 2022-12-25 DIAGNOSIS — L719 Rosacea, unspecified: Secondary | ICD-10-CM | POA: Diagnosis not present

## 2022-12-25 DIAGNOSIS — L570 Actinic keratosis: Secondary | ICD-10-CM

## 2022-12-25 NOTE — Progress Notes (Signed)
Follow-Up Visit   Subjective  Kimberly Figueroa is a 67 y.o. female who presents for the following: 3 month recheck. Right great toenail. Was using Skin Medicinals topical compound. Treat lichenoid actinic keratosis at left forearm, Bx proven 09/24/2022, with LN2. Recheck chest and legs. S/P 5FU-Calcipotriene treatment since last visit.  Recheck rosacea. Face. Taking Oracea 40 mg once daiy, using Zilxi and Tretinoin 0.025% cream at bedtime.  The patient has spots, moles and lesions to be evaluated, some may be new or changing and the patient has concerns that these could be cancer.   The following portions of the chart were reviewed this encounter and updated as appropriate: medications, allergies, medical history  Review of Systems:  No other skin or systemic complaints except as noted in HPI or Assessment and Plan.  Objective  Well appearing patient in no apparent distress; mood and affect are within normal limits.  A focused examination was performed of the following areas: Face, chest, legs, right great toenail, left arm Relevant physical exam findings are noted in the Assessment and Plan.    Assessment & Plan   ROSACEA Exam Mid face erythema with telangiectasias   Chronic and persistent condition with duration or expected duration over one year. Condition is symptomatic/ bothersome to patient. Not currently at goal.   Rosacea is a chronic progressive skin condition usually affecting the face of adults, causing redness and/or acne bumps. It is treatable but not curable. It sometimes affects the eyes (ocular rosacea) as well. It may respond to topical and/or systemic medication and can flare with stress, sun exposure, alcohol, exercise, topical steroids (including hydrocortisone/cortisone 10) and some foods.  Daily application of broad spectrum spf 30+ sunscreen to face is recommended to reduce flares.   Treatment Plan Continue Oracea 40mg  once daily as directed. (Can  switch back to doxycycline 20 mg bid if she does not feel the Oracea 40 mg works better). Increase to Doxycycline 100mg  bid until calm as needed for worse flares.  Continue Zilxi at bedtime, apply 2nd. Continue Tretinoin 0.025% cream at bedtime, apply 1st.  Will prescribe Skin Medicinals ivermectin/oxymetazoline cream to use daily in the morning to affected areas on the face. The patient was advised this is not covered by insurance since it is made by a compounding pharmacy. They will receive an email to check out and the medication will be mailed to their home.    Recommended non-comedogenic (non-acne causing) facial oils include 100% argan oil or squalane. The can be used after applying any recommended creams or ointments to the skin in the evening. [employer] has a high-quality and affordable version of both of these and can be found at [place].    ACTINIC KERATOSIS Exam: Erythematous thin papules/macules with gritty scale  Actinic keratoses are precancerous spots that appear secondary to cumulative UV radiation exposure/sun exposure over time. They are chronic with expected duration over 1 year. A portion of actinic keratoses will progress to squamous cell carcinoma of the skin. It is not possible to reliably predict which spots will progress to skin cancer and so treatment is recommended to prevent development of skin cancer.  Recommend daily broad spectrum sunscreen SPF 30+ to sun-exposed areas, reapply every 2 hours as needed.  Recommend staying in the shade or wearing long sleeves, sun glasses (UVA+UVB protection) and wide brim hats (4-inch brim around the entire circumference of the hat). Call for new or changing lesions.  Treatment Plan:  Prior to procedure, discussed  risks of blister formation, small wound, skin dyspigmentation, or rare scar following cryotherapy. Recommend Vaseline ointment to treated areas while healing.  Destruction Procedure Note Destruction  method: cryotherapy   Informed consent: discussed and consent obtained   Lesion destroyed using liquid nitrogen: Yes   Outcome: patient tolerated procedure well with no complications   Post-procedure details: wound care instructions given   Locations: left pretibia x1 # of Lesions Treated: 1   ONYCHOMYCOSIS Exam: Thickened toenails with subungal debris c/w onychomycosis Previous culture grew Candida guilliermondii   Treatment Plan: Fungal culture ordered today. Start Fluconazole 150 mg once a week for one month pending normal labs. May adjust therapy once culture results are back if another treatment would be likely to be more effective CBC w/diff, CMP ordered today Recheck labs in 1 month, if WNL plan to continue 5 additional months  Side effects of fluconazole (diflucan) include nausea, diarrhea, headache, dizziness, taste changes, rare risk of irritation of the liver, allergy, or decreased blood counts (which could show up as infection or tiredness).     ACTINIC DAMAGE WITH PRECANCEROUS ACTINIC KERATOSES Counseling for Topical Chemotherapy Management: Patient exhibits: - Severe, confluent actinic changes with pre-cancerous actinic keratoses that is secondary to cumulative UV radiation exposure over time - Condition that is severe; chronic, not at goal. - diffuse scaly erythematous macules and papules with underlying dyspigmentation - Discussed Prescription "Field Treatment" topical Chemotherapy for Severe, Chronic Confluent Actinic Changes with Pre-Cancerous Actinic Keratoses Field treatment involves treatment of an entire area of skin that has confluent Actinic Changes (Sun/ Ultraviolet light damage) and PreCancerous Actinic Keratoses by method of PhotoDynamic Therapy (PDT) and/or prescription Topical Chemotherapy agents such as 5-fluorouracil, 5-fluorouracil/calcipotriene, and/or imiquimod.  The purpose is to decrease the number of clinically evident and subclinical PreCancerous  lesions to prevent progression to development of skin cancer by chemically destroying early precancer changes that may or may not be visible.  It has been shown to reduce the risk of developing skin cancer in the treated area. As a result of treatment, redness, scaling, crusting, and open sores may occur during treatment course. One or more than one of these methods may be used and may have to be used several times to control, suppress and eliminate the PreCancerous changes. Discussed treatment course, expected reaction, and possible side effects. - Recommend daily broad spectrum sunscreen SPF 30+ to sun-exposed areas, reapply every 2 hours as needed.  - Staying in the shade or wearing long sleeves, sun glasses (UVA+UVB protection) and wide brim hats (4-inch brim around the entire circumference of the hat) are also recommended. - Call for new or changing lesions.  Start Fluorouracil 5% cream twice daily for 7 days to left forearm. Apply first  Start Calcipotriene cream twice daily for 7 days to left forearm. Apply  second  Reviewed course of treatment and expected reaction.  Patient advised to expect inflammation and crusting and advised that erosions are possible.  Patient advised to be diligent with sun protection during and after treatment. Counseled to keep medication out of reach of children and pets.    SEBORRHEIC KERATOSIS - Stuck-on, waxy, tan-brown papules and/or plaques  - Benign-appearing - Discussed benign etiology and prognosis. - Observe - Call for any changes Recommend using AmLactin cream to help smooth these out.      Return in about 6 months (around 06/27/2023) for Rosacea Follow Up, AK Follow Up, Nail Recheck.  I, Lawson Radar, CMA, am acting as scribe for Darden Dates, MD.  Documentation: I have reviewed the above documentation for accuracy and completeness, and I agree with the above.  Forest Gleason, MD

## 2022-12-25 NOTE — Patient Instructions (Addendum)
Seborrheic keratoses Recommend using AmLactin cream to help smooth these out.    Toenail: Start Fluconazole 150 mg once a week for one month pending normal labs. CBC w/diff, CMP ordered today Recheck labs in 1 month, if WNL plan to continue 5 additional months   Rosacea: Continue Oracea 40mg  once daily as directed. (Can switch back to doxycycline 20 mg bid if she does not feel the Oracea 40 mg works better). Increase to Doxycycline 100mg  bid until calm as needed for worse flares.  Continue Zilxi at bedtime, apply 2nd. Continue Tretinoin 0.025% cream at bedtime, apply 1st.  Will prescribe Skin Medicinals ivermectin/oxymetazoline cream to use daily in the morning to affected areas on the face. The patient was advised this is not covered by insurance since it is made by a compounding pharmacy. They will receive an email to check out and the medication will be mailed to their home.     Cryotherapy Aftercare  Wash gently with soap and water everyday.   Apply Vaseline and Band-Aid daily until healed.    Recommend daily broad spectrum sunscreen SPF 30+ to sun-exposed areas, reapply every 2 hours as needed. Call for new or changing lesions.  Staying in the shade or wearing long sleeves, sun glasses (UVA+UVB protection) and wide brim hats (4-inch brim around the entire circumference of the hat) are also recommended for sun protection.    Due to recent changes in healthcare laws, you may see results of your pathology and/or laboratory studies on MyChart before the doctors have had a chance to review them. We understand that in some cases there may be results that are confusing or concerning to you. Please understand that not all results are received at the same time and often the doctors may need to interpret multiple results in order to provide you with the best plan of care or course of treatment. Therefore, we ask that you please give [place] 2 business days to thoroughly review all your results  before contacting the office for clarification. Should we see a critical lab result, you will be contacted sooner.   If You Need Anything After Your Visit  If you have any questions or concerns for your doctor, please call our main line at [phone] and press option 4 to reach your doctor's medical assistant. If no one answers, please leave a voicemail as directed and we will return your call as soon as possible. Messages left after 4 pm will be answered the following business day.   You may also send [place] a message via MyChart. We typically respond to MyChart messages within 1-2 business days.  For prescription refills, please ask your pharmacy to contact our office. Our fax number is [phone].  If you have an urgent issue when the clinic is closed that cannot wait until the next business day, you can page your doctor at the number below.    Please note that while we do our best to be available for urgent issues outside of office hours, we are not available 24/7.   If you have an urgent issue and are unable to reach [place], you may choose to seek medical care at your doctor's office, retail clinic, urgent care center, or emergency room.  If you have a medical emergency, please immediately call 911 or go to the emergency department.  Pager Numbers  - Dr. Gwen Pounds: [phone]  - Dr. Neale Burly: [phone]  - Dr. Roseanne Reno: [phone]  In the event of inclement weather, please call our main line  at [phone] for an update on the status of any delays or closures.  Dermatology Medication Tips: Please keep the boxes that topical medications come in in order to help keep track of the instructions about where and how to use these. Pharmacies typically print the medication instructions only on the boxes and not directly on the medication tubes.   If your medication is too expensive, please contact our office at [phone] option 4 or send [place] a message through MyChart.   We are unable to  tell what your co-pay for medications will be in advance as this is different depending on your insurance coverage. However, we may be able to find a substitute medication at lower cost or fill out paperwork to get insurance to cover a needed medication.   If a prior authorization is required to get your medication covered by your insurance company, please allow [place] 1-2 business days to complete this process.  Drug prices often vary depending on where the prescription is filled and some pharmacies may offer cheaper prices.  The website www.goodrx.com contains coupons for medications through different pharmacies. The prices here do not account for what the cost may be with help from insurance (it may be cheaper with your insurance), but the website can give you the price if you did not use any insurance.  - You can print the associated coupon and take it with your prescription to the pharmacy.  - You may also stop by our office during regular business hours and pick up a GoodRx coupon card.  - If you need your prescription sent electronically to a different pharmacy, notify our office through [facility] or by phone at [phone] option 4.     Si Usted Necesita Algo Despus de Su Visita  Tambin puede enviarnos un mensaje a travs de Clinical cytogeneticist. Por lo general respondemos a los mensajes de MyChart en el transcurso de 1 a 2 das hbiles.  Para renovar recetas, por favor pida a su farmacia que se ponga en contacto con nuestra oficina. Annie Sable de fax es [place] [phone].  Si tiene un asunto urgente cuando la clnica est cerrada y que no puede esperar hasta el siguiente da hbil, puede llamar/localizar a su doctor(a) al nmero que aparece a continuacin.   Por favor, tenga en cuenta que aunque hacemos todo lo posible para estar disponibles para asuntos urgentes fuera del horario de [place], no estamos disponibles las 24 horas del da, los 7 [address]  [address] de la [place].   Si tiene un problema  urgente y no puede comunicarse con nosotros, puede optar por buscar atencin mdica  en el consultorio de su doctor(a), en una clnica privada, en un centro de atencin urgente o en una sala de emergencias.  Si tiene Engineer, drilling, por favor llame inmediatamente al 911 o vaya a la sala de emergencias.  Nmeros de bper  - Dr. Gwen Pounds: [phone]  - Dra. Moye: [phone]  - Dra. Roseanne Reno: [phone]  En caso de inclemencias del [place], por favor llame a Lacy Duverney principal al [phone] para una actualizacin sobre el [place] de cualquier retraso o cierre.  Consejos para la medicacin en dermatologa: Por favor, guarde las cajas en las que vienen los medicamentos de uso tpico para ayudarle a seguir las instrucciones sobre dnde y cmo usarlos. Las farmacias generalmente imprimen las instrucciones del medicamento slo en las cajas y no directamente en los tubos del [place].   Si su medicamento es [employer], por favor, pngase en contacto  con Rolm Gala llamando al [phone] y presione la opcin 4 o envenos un mensaje a travs de Clinical cytogeneticist.   No podemos decirle cul ser su copago por los medicamentos por adelantado ya que esto es diferente dependiendo de la cobertura de su seguro. Sin embargo, es posible que podamos encontrar un medicamento sustituto a Audiological scientist un formulario para que el seguro cubra el medicamento que se considera necesario.   Si se requiere una autorizacin previa para que su compaa de seguros [place] su medicamento, por favor permtanos de 1 a 2 das hbiles para completar [address]  Los precios de los medicamentos varan con frecuencia dependiendo del Environmental consultant de dnde se surte la receta y alguna farmacias pueden ofrecer precios ms baratos.  El sitio web www.goodrx.com tiene cupones para medicamentos de Health and safety inspector. Los precios aqu no tienen en cuenta lo que podra costar con la ayuda del seguro (puede ser ms barato con  su seguro), pero el sitio web puede darle el precio si no utiliz Tourist information centre manager.  - Puede imprimir el cupn correspondiente y llevarlo con su receta a la farmacia.  - Tambin puede pasar por nuestra oficina durante el horario de atencin regular y Education officer, museum una tarjeta de cupones de GoodRx.  - Si necesita que su receta se enve electrnicamente a una farmacia diferente, informe a nuestra oficina a travs de MyChart de [place] o por telfono llamando al [phone] y presione la opcin 4.

## 2022-12-29 ENCOUNTER — Encounter: Payer: Self-pay | Admitting: Dermatology

## 2022-12-31 DIAGNOSIS — Z1231 Encounter for screening mammogram for malignant neoplasm of breast: Secondary | ICD-10-CM | POA: Diagnosis not present

## 2022-12-31 LAB — HM MAMMOGRAPHY

## 2023-01-01 ENCOUNTER — Other Ambulatory Visit (HOSPITAL_COMMUNITY): Payer: Self-pay

## 2023-01-01 ENCOUNTER — Encounter: Payer: Self-pay | Admitting: Internal Medicine

## 2023-01-07 DIAGNOSIS — M5136 Other intervertebral disc degeneration, lumbar region: Secondary | ICD-10-CM | POA: Diagnosis not present

## 2023-01-07 DIAGNOSIS — M5416 Radiculopathy, lumbar region: Secondary | ICD-10-CM | POA: Diagnosis not present

## 2023-01-08 ENCOUNTER — Other Ambulatory Visit: Payer: Self-pay

## 2023-01-08 ENCOUNTER — Other Ambulatory Visit (HOSPITAL_COMMUNITY): Payer: Self-pay

## 2023-01-08 MED ORDER — MELOXICAM 7.5 MG PO TABS
7.5000 mg | ORAL_TABLET | Freq: Two times a day (BID) | ORAL | 0 refills | Status: AC | PRN
  Filled 2023-01-08: qty 30, 15d supply, fill #0

## 2023-01-09 DIAGNOSIS — B351 Tinea unguium: Secondary | ICD-10-CM | POA: Diagnosis not present

## 2023-01-09 DIAGNOSIS — Z79899 Other long term (current) drug therapy: Secondary | ICD-10-CM | POA: Diagnosis not present

## 2023-01-10 LAB — CBC WITH DIFFERENTIAL/PLATELET
Basophils Absolute: 0.1 10*3/uL (ref 0.0–0.2)
Basos: 1 %
EOS (ABSOLUTE): 0.2 10*3/uL (ref 0.0–0.4)
Eos: 4 %
Hematocrit: 40.5 % (ref 34.0–46.6)
Hemoglobin: 13.4 g/dL (ref 11.1–15.9)
Immature Grans (Abs): 0 10*3/uL (ref 0.0–0.1)
Immature Granulocytes: 0 %
Lymphocytes Absolute: 1.4 10*3/uL (ref 0.7–3.1)
Lymphs: 34 %
MCH: 30.4 pg (ref 26.6–33.0)
MCHC: 33.1 g/dL (ref 31.5–35.7)
MCV: 92 fL (ref 79–97)
Monocytes Absolute: 0.3 10*3/uL (ref 0.1–0.9)
Monocytes: 7 %
Neutrophils Absolute: 2.2 10*3/uL (ref 1.4–7.0)
Neutrophils: 54 %
Platelets: 186 10*3/uL (ref 150–450)
RBC: 4.41 x10E6/uL (ref 3.77–5.28)
RDW: 13.3 % (ref 11.7–15.4)
WBC: 4 10*3/uL (ref 3.4–10.8)

## 2023-01-10 LAB — COMPREHENSIVE METABOLIC PANEL
ALT: 18 IU/L (ref 0–32)
AST: 21 IU/L (ref 0–40)
Albumin/Globulin Ratio: 2.2 (ref 1.2–2.2)
Albumin: 4.4 g/dL (ref 3.9–4.9)
Alkaline Phosphatase: 105 IU/L (ref 44–121)
BUN/Creatinine Ratio: 15 (ref 12–28)
BUN: 10 mg/dL (ref 8–27)
Bilirubin Total: <0.2 mg/dL (ref 0.0–1.2)
CO2: 27 mmol/L (ref 20–29)
Calcium: 9.1 mg/dL (ref 8.7–10.3)
Chloride: 103 mmol/L (ref 96–106)
Creatinine, Ser: 0.65 mg/dL (ref 0.57–1.00)
Globulin, Total: 2 g/dL (ref 1.5–4.5)
Glucose: 104 mg/dL — ABNORMAL HIGH (ref 70–99)
Potassium: 4.1 mmol/L (ref 3.5–5.2)
Sodium: 143 mmol/L (ref 134–144)
Total Protein: 6.4 g/dL (ref 6.0–8.5)
eGFR: 98 mL/min/{1.73_m2} (ref >59)

## 2023-01-13 ENCOUNTER — Telehealth: Payer: Self-pay

## 2023-01-13 MED ORDER — FLUCONAZOLE 150 MG PO TABS
150.0000 mg | ORAL_TABLET | ORAL | 0 refills | Status: DC
  Filled 2023-01-13: qty 4, 28d supply, fill #0

## 2023-01-13 NOTE — Telephone Encounter (Signed)
Called patient. N/A. LMOVM labs WNL. Fluconazole 150 mg sent to pharmacy to take once a week for 4 weeks then repeat labs. C/B if any questions.

## 2023-01-13 NOTE — Telephone Encounter (Signed)
-----   Message from Sandi Mealy, MD sent at 01/13/2023  2:12 PM EDT ----- Labs ok, start fluconazole 150 mg weekly x 4 doses, then recheck labs.   MAs please call. Thank you!

## 2023-01-14 ENCOUNTER — Other Ambulatory Visit: Payer: Self-pay

## 2023-01-14 ENCOUNTER — Other Ambulatory Visit (HOSPITAL_COMMUNITY): Payer: Self-pay

## 2023-01-15 ENCOUNTER — Other Ambulatory Visit: Payer: Self-pay

## 2023-01-15 ENCOUNTER — Other Ambulatory Visit (HOSPITAL_COMMUNITY): Payer: Self-pay

## 2023-01-22 ENCOUNTER — Encounter: Payer: Self-pay | Admitting: Dermatology

## 2023-01-22 LAB — FUNGUS CULTURE W SMEAR

## 2023-01-26 ENCOUNTER — Other Ambulatory Visit: Payer: Self-pay | Admitting: Dermatology

## 2023-01-26 DIAGNOSIS — L700 Acne vulgaris: Secondary | ICD-10-CM

## 2023-02-05 ENCOUNTER — Other Ambulatory Visit (HOSPITAL_COMMUNITY): Payer: Self-pay

## 2023-02-05 MED ORDER — OXYCODONE HCL 5 MG PO TABS
5.0000 mg | ORAL_TABLET | Freq: Three times a day (TID) | ORAL | 0 refills | Status: AC | PRN
  Filled 2023-02-05: qty 90, 30d supply, fill #0

## 2023-02-06 DIAGNOSIS — B351 Tinea unguium: Secondary | ICD-10-CM | POA: Diagnosis not present

## 2023-02-06 DIAGNOSIS — Z79899 Other long term (current) drug therapy: Secondary | ICD-10-CM | POA: Diagnosis not present

## 2023-02-07 ENCOUNTER — Other Ambulatory Visit: Payer: Self-pay | Admitting: Dermatology

## 2023-02-07 LAB — CBC WITH DIFFERENTIAL/PLATELET
Basophils Absolute: 0.1 10*3/uL (ref 0.0–0.2)
Basos: 1 %
EOS (ABSOLUTE): 0.1 10*3/uL (ref 0.0–0.4)
Eos: 3 %
Hematocrit: 38.9 % (ref 34.0–46.6)
Hemoglobin: 12.7 g/dL (ref 11.1–15.9)
Immature Grans (Abs): 0 10*3/uL (ref 0.0–0.1)
Immature Granulocytes: 0 %
Lymphocytes Absolute: 1.7 10*3/uL (ref 0.7–3.1)
Lymphs: 35 %
MCH: 30 pg (ref 26.6–33.0)
MCHC: 32.6 g/dL (ref 31.5–35.7)
MCV: 92 fL (ref 79–97)
Monocytes Absolute: 0.4 10*3/uL (ref 0.1–0.9)
Monocytes: 7 %
Neutrophils Absolute: 2.7 10*3/uL (ref 1.4–7.0)
Neutrophils: 54 %
Platelets: 174 10*3/uL (ref 150–450)
RBC: 4.23 x10E6/uL (ref 3.77–5.28)
RDW: 13.1 % (ref 11.7–15.4)
WBC: 5 10*3/uL (ref 3.4–10.8)

## 2023-02-07 LAB — COMPREHENSIVE METABOLIC PANEL
ALT: 14 IU/L (ref 0–32)
AST: 23 IU/L (ref 0–40)
Albumin: 4.6 g/dL (ref 3.9–4.9)
Alkaline Phosphatase: 108 IU/L (ref 44–121)
BUN/Creatinine Ratio: 18 (ref 12–28)
BUN: 13 mg/dL (ref 8–27)
Bilirubin Total: 0.3 mg/dL (ref 0.0–1.2)
CO2: 25 mmol/L (ref 20–29)
Calcium: 9.4 mg/dL (ref 8.7–10.3)
Chloride: 103 mmol/L (ref 96–106)
Creatinine, Ser: 0.71 mg/dL (ref 0.57–1.00)
Globulin, Total: 1.9 g/dL (ref 1.5–4.5)
Glucose: 86 mg/dL (ref 70–99)
Potassium: 4 mmol/L (ref 3.5–5.2)
Sodium: 144 mmol/L (ref 134–144)
Total Protein: 6.5 g/dL (ref 6.0–8.5)
eGFR: 95 mL/min/{1.73_m2} (ref >59)

## 2023-02-09 ENCOUNTER — Other Ambulatory Visit (HOSPITAL_COMMUNITY): Payer: Self-pay

## 2023-02-09 ENCOUNTER — Other Ambulatory Visit: Payer: Self-pay

## 2023-02-09 MED ORDER — FLUCONAZOLE 150 MG PO TABS
150.0000 mg | ORAL_TABLET | ORAL | 5 refills | Status: DC
  Filled 2023-02-09: qty 4, 28d supply, fill #0
  Filled 2023-02-19: qty 4, 28d supply, fill #1
  Filled 2023-03-30: qty 4, 28d supply, fill #2
  Filled 2023-04-27: qty 4, 28d supply, fill #3
  Filled 2023-05-21: qty 4, 28d supply, fill #4
  Filled 2023-07-13: qty 4, 28d supply, fill #5

## 2023-02-10 ENCOUNTER — Telehealth: Payer: Self-pay

## 2023-02-10 NOTE — Telephone Encounter (Signed)
Discussed lab results. Fluconazole refill 150 mg once/week x5 months sent to pharmacy 02/09/23. Patient voiced understanding.

## 2023-02-10 NOTE — Telephone Encounter (Signed)
-----   Message from [place], MD sent at 02/09/2023 11:54 AM EDT ----- Labs ok, continue fluconazole 150 mg tablets weekly for 5 more months   Side effects of fluconazole (diflucan) include nausea, diarrhea, headache, dizziness, taste changes, rare risk of irritation of the liver, allergy, or decreased blood counts (which could show up as infection or tiredness). It is not recommended to drink alcohol while taking fluconazole.  MAs please call. Thank you!

## 2023-02-20 ENCOUNTER — Other Ambulatory Visit (HOSPITAL_COMMUNITY): Payer: Self-pay

## 2023-02-23 ENCOUNTER — Encounter: Payer: Self-pay | Admitting: Internal Medicine

## 2023-02-23 ENCOUNTER — Ambulatory Visit (INDEPENDENT_AMBULATORY_CARE_PROVIDER_SITE_OTHER): Payer: Medicare HMO | Admitting: Internal Medicine

## 2023-02-23 VITALS — BP 116/68 | HR 75 | Temp 98.5°F | Resp 16 | Ht 67.0 in | Wt 150.0 lb

## 2023-02-23 DIAGNOSIS — R400 Somnolence: Secondary | ICD-10-CM

## 2023-02-23 DIAGNOSIS — Z0000 Encounter for general adult medical examination without abnormal findings: Secondary | ICD-10-CM

## 2023-02-23 DIAGNOSIS — Z13220 Encounter for screening for lipoid disorders: Secondary | ICD-10-CM

## 2023-02-23 DIAGNOSIS — R232 Flushing: Secondary | ICD-10-CM | POA: Diagnosis not present

## 2023-02-23 DIAGNOSIS — Z0001 Encounter for general adult medical examination with abnormal findings: Secondary | ICD-10-CM | POA: Diagnosis not present

## 2023-02-23 DIAGNOSIS — R739 Hyperglycemia, unspecified: Secondary | ICD-10-CM

## 2023-02-23 DIAGNOSIS — N951 Menopausal and female climacteric states: Secondary | ICD-10-CM

## 2023-02-23 DIAGNOSIS — Z1211 Encounter for screening for malignant neoplasm of colon: Secondary | ICD-10-CM

## 2023-02-23 DIAGNOSIS — F5104 Psychophysiologic insomnia: Secondary | ICD-10-CM

## 2023-02-23 DIAGNOSIS — D696 Thrombocytopenia, unspecified: Secondary | ICD-10-CM

## 2023-02-23 LAB — LIPID PANEL
Cholesterol: 165 mg/dL (ref 0–200)
HDL: 61.5 mg/dL (ref >39.00)
LDL Cholesterol: 90 mg/dL (ref 0–99)
NonHDL: 103.13
Total CHOL/HDL Ratio: 3
Triglycerides: 66 mg/dL (ref 0.0–149.0)
VLDL: 13.2 mg/dL (ref 0.0–40.0)

## 2023-02-23 LAB — VITAMIN D 25 HYDROXY (VIT D DEFICIENCY, FRACTURES): VITD: 41.24 ng/mL (ref 30.00–100.00)

## 2023-02-23 LAB — TSH: TSH: 2.4 u[IU]/mL (ref 0.35–5.50)

## 2023-02-23 LAB — HEMOGLOBIN A1C: Hgb A1c MFr Bld: 5.9 % (ref 4.6–6.5)

## 2023-02-23 NOTE — Progress Notes (Signed)
Subjective:    Patient ID: Kimberly Figueroa, female    DOB: 06-26-1956, 67 y.o.   MRN: [id number]  Patient here for  Chief Complaint  Patient presents with   Annual Exam    HPI Here for a physical exam. History of insomnia and daytime sleepiness. Continues on [place] and provigil. Being followed by [facility] - back pain.  S/p [facility] 09/2021. Saw neurology - f/u insomnia and daytime sleepiness.  Recommended to continue ambien 10mg  q hs and provigil 100mg  prn.  Handling stress.  Tries to stay active.  No chest pain or sob reported.  No abdominal pain or bowel change reported.  Problems with hot flashes. More problems recently.  Also concerned regarding weight gain.  Discussed exercise and sleep issues.     Past Medical History:  Diagnosis Date   Actinic keratosis 08/21/2021   Left medial calf.   Endometriosis    History of frequent urinary tract infections    s/p bladder dilatation (11/05)   Hypersomnia, recurrent 06/28/2014   ITP (idiopathic thrombocytopenic purpura)    Pneumonia    Rosacea    Past Surgical History:  Procedure Laterality Date   bladder dilatation  11/05   CATARACT EXTRACTION W/PHACO Right 12/13/2019   Procedure: CATARACT EXTRACTION PHACO AND INTRAOCULAR LENS PLACEMENT (IOC) RIGHT VIVITY TORIC LENS 4.35 00:30.2;  Surgeon: Galen Manila, MD;  Location: [facility];  Service: Ophthalmology;  Laterality: Right;   CATARACT EXTRACTION W/PHACO Left 01/10/2020   Procedure: CATARACT EXTRACTION PHACO AND INTRAOCULAR LENS PLACEMENT (IOC) LEFT VIVITY TORIC LENS 6.19  00:32.8;  Surgeon: Galen Manila, MD;  Location: [facility];  Service: Ophthalmology;  Laterality: Left;   CESAREAN SECTION  1993   COLONOSCOPY     laproscopic surgery  10/06   found to have endometriosis   SHOULDER SURGERY Left 2008   TONSILLECTOMY  1961   VAGINAL DELIVERY  1987   VAGINAL HYSTERECTOMY  02/07   Family History  Problem Relation Age of Onset   Alcohol abuse Mother     Varicose Veins Mother    Diabetes Father    Hearing loss Father    Heart disease Father    Crohn's disease Father    Crohn's disease Paternal Grandfather    Breast cancer Neg Hx    Colon cancer Neg Hx    Social History   Socioeconomic History   Marital status: Married    Spouse name: Chrissie Noa   Number of children: 2   Years of education: college   Highest education level: Not on file  Occupational History   Occupation: Pharmacist, hospital. Director    Employer: [employer]  Tobacco Use   Smoking status: Never   Smokeless tobacco: Never  Vaping Use   Vaping Use: Never used  Substance and Sexual Activity   Alcohol use: Yes    Alcohol/week: 2.0 standard drinks of alcohol    Types: 2 Glasses of wine per week   Drug use: No   Sexual activity: Not on file  Other Topics Concern   Not on file  Social History Narrative   Patient consumes 6-8 cups tea daily, is right handed   Social Determinants of Health   Financial Resource Strain: Not on file  Food Insecurity: Not on file  Transportation Needs: Not on file  Physical Activity: Not on file  Stress: Not on file  Social Connections: Not on file     Review of Systems  Constitutional:  Negative for appetite change and unexpected weight  change.  HENT:  Negative for congestion, sinus pressure and sore throat.   Eyes:  Negative for pain and visual disturbance.  Respiratory:  Negative for cough, chest tightness and shortness of breath.   Cardiovascular:  Negative for chest pain, palpitations and leg swelling.  Gastrointestinal:  Negative for abdominal pain, diarrhea, nausea and vomiting.  Genitourinary:  Negative for difficulty urinating and dysuria.  Musculoskeletal:  Negative for joint swelling and myalgias.  Skin:  Negative for color change and rash.  Neurological:  Negative for dizziness and headaches.  Hematological:  Negative for adenopathy. Does not bruise/bleed easily.  Psychiatric/Behavioral:  Negative for agitation and  dysphoric mood.        Objective:     BP 116/68   Pulse 75   Temp 98.5 F (36.9 C)   Resp 16   Ht 5\' 7"  (1.702 m)   Wt 150 lb (68 kg)   SpO2 98%   BMI 23.49 kg/m  Wt Readings from Last 3 Encounters:  02/23/23 150 lb (68 kg)  07/25/21 150 lb (68 kg)  04/23/21 154 lb 12.8 oz (70.2 kg)    Physical Exam Vitals reviewed.  Constitutional:      General: She is not in acute distress.    Appearance: Normal appearance. She is well-developed.  HENT:     Head: Normocephalic and atraumatic.     Right Ear: External ear normal.     Left Ear: External ear normal.  Eyes:     General: No scleral icterus.       Right eye: No discharge.        Left eye: No discharge.     Conjunctiva/sclera: Conjunctivae normal.  Neck:     Thyroid: No thyromegaly.  Cardiovascular:     Rate and Rhythm: Normal rate and regular rhythm.  Pulmonary:     Effort: No tachypnea, accessory muscle usage or respiratory distress.     Breath sounds: Normal breath sounds. No decreased breath sounds or wheezing.  Chest:  Breasts:    Right: No inverted nipple, mass, nipple discharge or tenderness (no axillary adenopathy).     Left: No inverted nipple, mass, nipple discharge or tenderness (no axilarry adenopathy).  Abdominal:     General: Bowel sounds are normal.     Palpations: Abdomen is soft.     Tenderness: There is no abdominal tenderness.  Musculoskeletal:        General: No swelling or tenderness.     Cervical back: Neck supple. No tenderness.  Lymphadenopathy:     Cervical: No cervical adenopathy.  Skin:    Findings: No erythema or rash.  Neurological:     Mental Status: She is alert and oriented to person, place, and time.  Psychiatric:        Mood and Affect: Mood normal.        Behavior: Behavior normal.      Outpatient Encounter Medications as of 02/23/2023  Medication Sig   calcipotriene (DOVONOX) 0.005 % cream Apply twice daily for 7 days to left lower leg, right ankle and chest. Apply 2nd    citalopram (CELEXA) 40 MG tablet Take 1 tablet (40 mg total) by mouth daily.   COVID-19 At Home Antigen Test (CARESTART COVID-19 HOME TEST) KIT Use as directed   cyclobenzaprine (FLEXERIL) 5 MG tablet Take 1 tablet by mouth 2 times daily   cyclobenzaprine (FLEXERIL) 5 MG tablet Take 1 tablet by mouth 2 times every day (90 day supply)   doxycycline (ORACEA) 40 MG capsule  TAKE ONE CAPSULE BY MOUTH DAILY. TAKE WITH FOOD.   fluconazole (DIFLUCAN) 150 MG tablet Take 1 tablet (150 mg total) by mouth once a week for 4 weeks.   fluorouracil (EFUDEX) 5 % cream Apply twice daily for 7 days to left lower leg, right ankle and chest. Apply 1st   Lifitegrast (XIIDRA) 5 % SOLN Place 1 drop into both eyes 2 times a day approximately 12 hours apart   meloxicam (MOBIC) 7.5 MG tablet Take 1 tablet (7.5 mg total) by mouth 2 (two) times daily as needed.   Minocycline HCl Micronized (ZILXI) 1.5 % FOAM Apply a thin layer to the face QHS.   modafinil (PROVIGIL) 100 MG tablet Take 1 tablet (100 mg total) by mouth daily.   Multiple Vitamins-Minerals (MULTIVITAMIN GUMMIES ADULTS PO) Take by mouth. VitaCraves   oxyCODONE (OXY IR/ROXICODONE) 5 MG immediate release tablet Take 1 tablet by mouth every 8 hours as needed   oxyCODONE (OXY IR/ROXICODONE) 5 MG immediate release tablet Take 1 tablet (5 mg total) by mouth every 8 (eight) hours as needed.   oxyCODONE (OXY IR/ROXICODONE) 5 MG immediate release tablet Take 1 tablet (5 mg total) by mouth every 8 (eight) hours as needed.   pimecrolimus (ELIDEL) 1 % cream Apply topically 2 (two) times daily. Apply to scaly areas at ears   tretinoin (RETIN-A) 0.025 % cream APPLY A PEA SIZED AMOUNT TO THE ENTIRE FACE EVERY NIGHT AT BEDTIME   triamcinolone cream (KENALOG) 0.1 % Apply twice daily to ears up to 2 weeks as needed for itching.   UNABLE TO FIND 2 (two) times daily as needed. Skin Medicinals(cream):  Azelaic Acid 15%, Ivermectin 1%, Metronidazole 1%   zolpidem (AMBIEN) 10 MG  tablet Take 1 tablet (10 mg total) by mouth at bedtime as needed.   [DISCONTINUED] Progesterone Micronized 10 % CREA Apply 1ml topically every day.   No facility-administered encounter medications on file as of 02/23/2023.     Lab Results  Component Value Date   WBC 5.0 02/06/2023   HGB 12.7 02/06/2023   HCT 38.9 02/06/2023   PLT 174 02/06/2023   GLUCOSE 86 02/06/2023   CHOL 165 02/23/2023   TRIG 66.0 02/23/2023   HDL 61.50 02/23/2023   LDLCALC 90 02/23/2023   ALT 14 02/06/2023   AST 23 02/06/2023   NA 144 02/06/2023   K 4.0 02/06/2023   CL 103 02/06/2023   CREATININE 0.71 02/06/2023   BUN 13 02/06/2023   CO2 25 02/06/2023   TSH 2.40 02/23/2023   HGBA1C 5.9 02/23/2023       Assessment & Plan:  Routine general medical examination at a health care facility  Hyperglycemia Assessment & Plan: Low carb diet and exercise.  Follow met b and a1c.    Orders: -     Hemoglobin A1c -     TSH -     VITAMIN D 25 Hydroxy (Vit-D Deficiency, Fractures)  Screening cholesterol level -     Lipid panel  Hot flashes Assessment & Plan: Increased problem lately.  Discussed treatment options.  Check labs as outlined.    Orders: -     VITAMIN D 25 Hydroxy (Vit-D Deficiency, Fractures)  Chronic insomnia Assessment & Plan: Followed by neurology.  Recommended continuing ambien.    Colon cancer screening Assessment & Plan: Colonoscopy 2015.  Recommended f/u in 10 years.    Daytime somnolence Assessment & Plan: Followed by neurology.  Has provigil.    Health care maintenance Assessment & Plan: Physical  today 02/23/23.  S/p hysterectomy.  Mammogram 12/31/22 - Birads II.  Colonoscopy 2015.  Recommended f/u in 10 years.     Menopausal symptoms Assessment & Plan: On citalopram.  Check labs as outlined.    Thrombocytopenia (HCC) Assessment & Plan: Follow cbc today to confirm platelet count wnl.       Dale [place], MD

## 2023-02-24 ENCOUNTER — Other Ambulatory Visit (HOSPITAL_COMMUNITY): Payer: Self-pay

## 2023-02-24 ENCOUNTER — Encounter: Payer: Self-pay | Admitting: Internal Medicine

## 2023-02-24 ENCOUNTER — Other Ambulatory Visit: Payer: Self-pay

## 2023-02-24 DIAGNOSIS — Z136 Encounter for screening for cardiovascular disorders: Secondary | ICD-10-CM

## 2023-02-24 NOTE — Telephone Encounter (Signed)
Can I put this fax number in EPIC when ordering or do I need to external?

## 2023-02-24 NOTE — Telephone Encounter (Signed)
I am not sure the best way to do this.  Kimberly Figueroa may be able to help.  One option would be to place order and print out order.  I can place the order if I need to .

## 2023-02-25 ENCOUNTER — Other Ambulatory Visit: Payer: Self-pay

## 2023-02-25 NOTE — Telephone Encounter (Signed)
Requested Prescriptions   Pending Prescriptions Disp Refills   zolpidem (AMBIEN) 10 MG tablet 90 tablet 1    Sig: Take 1 tablet (10 mg total) by mouth at bedtime as needed.    Dispensed Days Supply Quantity Provider Pharmacy  zolpidem (AMBIEN) 10 MG tablet 02/09/2023 90 90 tablet Butch Penny, NP [place] [facility]  zolpidem (AMBIEN) 10 MG tablet 11/12/2022 90 90 tablet Butch Penny, NP [place] [facility]  zolpidem (AMBIEN) 10 MG tablet 08/13/2022 90 90 tablet Butch Penny, NP [place] [facility]  zolpidem (AMBIEN) 10 MG tablet 05/13/2022 90 90 tablet Butch Penny, NP [place] [facility]        Last seen 09/09/2022 Next visit 03/31/2023  Dispensed at [facility] 02/09/23. Refill denied

## 2023-02-25 NOTE — Telephone Encounter (Signed)
Order has been placed.

## 2023-02-25 NOTE — Telephone Encounter (Signed)
Reviewed.  We did discuss this at her appt.  I may have misunderstood, but I thought she wanted to hold on starting anything and monitor symptoms.  Does she feel she needs something more or does she want to start something new?

## 2023-02-27 ENCOUNTER — Other Ambulatory Visit (HOSPITAL_COMMUNITY): Payer: Self-pay

## 2023-02-27 ENCOUNTER — Telehealth: Payer: Self-pay

## 2023-02-27 NOTE — Telephone Encounter (Signed)
Patient stated that she does not have a preference of trying something more or something new- she will leave that up to you. She is taking citalopram 40. Patient stated that you did discuss this at her appt and at the time she did say to hold off but has decided now that she feels she needs something more or different.

## 2023-02-27 NOTE — Telephone Encounter (Signed)
Pharmacy Patient Advocate Encounter   Received notification from [facility] that prior authorization for Modafinil 100MG  tablets is required/requested.   PA submitted to [facility] via CoverMyMeds Key or (Medicaid) confirmation # [id number] Status is pending

## 2023-02-28 ENCOUNTER — Encounter: Payer: Self-pay | Admitting: Internal Medicine

## 2023-02-28 NOTE — Assessment & Plan Note (Signed)
Colonoscopy 2015.  Recommended f/u in 10 years.

## 2023-02-28 NOTE — Assessment & Plan Note (Signed)
On citalopram.  Check labs as outlined.

## 2023-02-28 NOTE — Assessment & Plan Note (Signed)
Followed by neurology.  Has provigil.

## 2023-02-28 NOTE — Assessment & Plan Note (Signed)
Follow [facility] today to confirm platelet count wnl.  

## 2023-02-28 NOTE — Assessment & Plan Note (Signed)
Physical today 02/23/23.  S/p hysterectomy.  Mammogram 12/31/22 - Birads II.  Colonoscopy 2015.  Recommended f/u in 10 years.

## 2023-02-28 NOTE — Assessment & Plan Note (Signed)
Low carb diet and exercise.  Follow met b and a1c.   

## 2023-02-28 NOTE — Assessment & Plan Note (Signed)
Followed by neurology.  Recommended continuing ambien.

## 2023-02-28 NOTE — Assessment & Plan Note (Signed)
Increased problem lately.  Discussed treatment options.  Check labs as outlined.

## 2023-03-02 ENCOUNTER — Other Ambulatory Visit (HOSPITAL_COMMUNITY): Payer: Self-pay

## 2023-03-02 MED ORDER — CYCLOBENZAPRINE HCL 5 MG PO TABS
5.0000 mg | ORAL_TABLET | Freq: Two times a day (BID) | ORAL | 3 refills | Status: DC
  Filled 2023-03-02: qty 180, 90d supply, fill #0

## 2023-03-02 NOTE — Telephone Encounter (Signed)
Pharmacy Patient Advocate Encounter  Prior Authorization for Modafinil 100MG  tablets has been APPROVED by [facility] from 08/25/2022 to 08/25/2023.   PA # PA Case ID #: [id number]  Spoke with Pharmacy to process. Copay is $21.90 for a 90DS per [facility] test claim.

## 2023-03-02 NOTE — Telephone Encounter (Signed)
Given her symptoms and concerns and given already on citalopram, I would like to refer her to gyn for further evaluation and to discuss treatment options.

## 2023-03-03 ENCOUNTER — Other Ambulatory Visit: Payer: Self-pay | Admitting: *Deleted

## 2023-03-03 ENCOUNTER — Encounter: Payer: Self-pay | Admitting: Internal Medicine

## 2023-03-03 DIAGNOSIS — N951 Menopausal and female climacteric states: Secondary | ICD-10-CM

## 2023-03-03 NOTE — Telephone Encounter (Signed)
Last visit: 09/09/22  Next visit: 03/31/23  Per [place] registry, last fill:   We received a refill request from [facility] mail order pharmacy for zolpidem 10 mg.  I spoke with the patient.  She has a follow-up video visit next month with [place].  Since her most recent refill will last her 3 September, she is okay with this we need to do any refills until after she is seen.

## 2023-03-05 NOTE — Telephone Encounter (Signed)
I can place referral for her. Just need to know reason and dx (she was wanting to discuss meds for hot flashes)

## 2023-03-05 NOTE — Telephone Encounter (Signed)
Ok for referral.  Dx - menopausal symptoms.

## 2023-03-06 ENCOUNTER — Other Ambulatory Visit (HOSPITAL_COMMUNITY): Payer: Self-pay

## 2023-03-13 ENCOUNTER — Encounter: Payer: Self-pay | Admitting: Internal Medicine

## 2023-03-17 ENCOUNTER — Telehealth: Payer: Commercial Managed Care - PPO | Admitting: Adult Health

## 2023-03-26 ENCOUNTER — Telehealth: Payer: Self-pay

## 2023-03-26 DIAGNOSIS — H16223 Keratoconjunctivitis sicca, not specified as Sjogren's, bilateral: Secondary | ICD-10-CM | POA: Diagnosis not present

## 2023-03-26 DIAGNOSIS — H26493 Other secondary cataract, bilateral: Secondary | ICD-10-CM | POA: Diagnosis not present

## 2023-03-26 DIAGNOSIS — H524 Presbyopia: Secondary | ICD-10-CM | POA: Diagnosis not present

## 2023-03-26 DIAGNOSIS — H04123 Dry eye syndrome of bilateral lacrimal glands: Secondary | ICD-10-CM | POA: Diagnosis not present

## 2023-03-26 DIAGNOSIS — Z961 Presence of intraocular lens: Secondary | ICD-10-CM | POA: Diagnosis not present

## 2023-03-26 MED ORDER — DOXYCYCLINE HYCLATE 20 MG PO TABS: 20.0000 mg | ORAL_TABLET | Freq: Two times a day (BID) | ORAL | 2 refills | Status: DC

## 2023-03-26 NOTE — Telephone Encounter (Signed)
Patient called regarding Doxycycline 40mg . She has recently went to [facility] and this will cost her over $1,000. RX for 20mg  BID sent to [facility]. aw

## 2023-03-30 ENCOUNTER — Other Ambulatory Visit: Payer: Self-pay | Admitting: Internal Medicine

## 2023-03-30 ENCOUNTER — Other Ambulatory Visit (HOSPITAL_COMMUNITY): Payer: Self-pay

## 2023-03-31 ENCOUNTER — Other Ambulatory Visit (HOSPITAL_COMMUNITY): Payer: Self-pay

## 2023-03-31 ENCOUNTER — Other Ambulatory Visit: Payer: Self-pay

## 2023-03-31 ENCOUNTER — Telehealth (INDEPENDENT_AMBULATORY_CARE_PROVIDER_SITE_OTHER): Payer: Medicare HMO | Admitting: Adult Health

## 2023-03-31 DIAGNOSIS — F5104 Psychophysiologic insomnia: Secondary | ICD-10-CM | POA: Diagnosis not present

## 2023-03-31 DIAGNOSIS — R400 Somnolence: Secondary | ICD-10-CM

## 2023-03-31 MED ORDER — ZOLPIDEM TARTRATE 10 MG PO TABS: 10.0000 mg | ORAL_TABLET | Freq: Every evening | ORAL | 1 refills | Status: DC | PRN

## 2023-03-31 MED ORDER — CITALOPRAM HYDROBROMIDE 40 MG PO TABS
40.0000 mg | ORAL_TABLET | Freq: Every day | ORAL | 1 refills | Status: DC
  Filled 2023-03-31: qty 90, 90d supply, fill #0

## 2023-03-31 NOTE — Progress Notes (Signed)
PATIENT: Kimberly WOLDEN DOB: 08-26-1955  REASON FOR VISIT: follow up HISTORY FROM: patient  Virtual Visit via Video Note  I connected with Kimberly Figueroa on 03/31/23 at  1:15 PM EDT by a video enabled telemedicine application located remotely at [facility] and verified that I am speaking with the correct person using two identifiers who was located at their own home.   I discussed the limitations of evaluation and management by telemedicine and the availability of in person appointments. The patient expressed understanding and agreed to proceed.   PATIENT: Kimberly Figueroa DOB: 07-02-1956  REASON FOR VISIT: follow up HISTORY FROM: patient  HISTORY OF PRESENT ILLNESS: Today 03/31/23:  Kimberly Figueroa is a 67 y.o. female with a history of Insomnia and daytime sleepiness. Returns today for follow-up.  She reports that she takes Ambien each night and this works well for her.  She denies any new issues.  She also has Provigil but only uses it once or twice a month.  She currently resides in [facility].  She returns today for virtual visit   09/09/22: Kimberly Figueroa is a 67 y.o. female with a history of Insomnia and daytime sleepiness. Returns today for follow-up.  She is now taking a full tablet of Ambien each night.  She states that she is sleeping the best that she has sleep.  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  She states that she does have Provigil but typically only uses it once or twice a month for longer drives.  She returns today for an evaluation.   5/23/23Ms. Figueroa is a 67 year old female with a history of insomnia and daytime sleepiness.  She returns today for follow-up.  She reports that she typically takes half a tablet of Ambien most nights.  On occasion she will have to take the extra half of Ambien.  She continues to use Provigil during the day.  But she primarily uses it only  when she is driving.  07/16/21: Kimberly Figueroa is a 67 year old female with a history of insomnia and daytime sleepiness.  She returns today for follow-up.  She continues on Ambien and Provigil.  She states that typically she can take a half a tablet of Ambien and that works well.  She states that her father recently passed and she has been using 1 full tablet of Ambien but plans to reduce her dose in the future.  She does not take Provigil daily.  Continues to only use it if she has to drop early in the morning.  01/22/21: Kimberly Figueroa is a 67 year old female with a history of insomnia and daytime sleepiness.  She returns today for follow-up.  She states that she typically takes a half a tablet of Ambien nightly.  On occasion she does have to take the other half.  She reports on the rare occasion she will have to go ahead and take the full tablet at bedtime.  She states that she has Provigil but probably is only used 3 times in the last month.  Overall she feels that this is working well for her.  She returns today for an evaluation.  HISTORY Kimberly Figueroa is a 67 y.o. female who has been followed in this office for insomnia and daytime sleepiness.  She was initially scheduled for MyChart video visit however she was unable to hear me so we transferred to a telephone visit.  She states that over the last  month her stress levels have increased.  Therefore she has been having a difficult time sleeping and has been using 1 full tablet of Ambien.  She also reports that she has been having low back pain and is seeing pain management.  She states this also disrupts her sleep.  She states that she has Provigil but probably use it 5 to 10 days out of a month.  She typically only uses it when she has early morning meetings.  She returns today for follow-up.    REVIEW OF SYSTEMS: Out of a complete 14 system review of symptoms, the patient complains only of the following symptoms, and all other reviewed  systems are negative.  See HPI  ALLERGIES: Allergies  Allergen Reactions   Darvon [Propoxyphene] Nausea Only   Vicodin [Hydrocodone-Acetaminophen] Itching    HOME MEDICATIONS: Outpatient Medications Prior to Visit  Medication Sig Dispense Refill   calcipotriene (DOVONOX) 0.005 % cream Apply twice daily for 7 days to left lower leg, right ankle and chest. Apply 2nd 60 g 1   citalopram (CELEXA) 40 MG tablet Take 1 tablet (40 mg total) by mouth daily. 90 tablet 1   COVID-19 At Home Antigen Test (CARESTART COVID-19 HOME TEST) KIT Use as directed 2 kit 0   cyclobenzaprine (FLEXERIL) 5 MG tablet Take 1 tablet by mouth 2 times daily 180 tablet 3   cyclobenzaprine (FLEXERIL) 5 MG tablet Take 1 tablet by mouth 2 times every day (90 day supply) 180 tablet 3   cyclobenzaprine (FLEXERIL) 5 MG tablet Take 1 tablet (5 mg total) by mouth 2 (two) times daily. 180 tablet 3   doxycycline (ORACEA) 40 MG capsule TAKE ONE CAPSULE BY MOUTH DAILY. TAKE WITH FOOD. 30 capsule 3   doxycycline (PERIOSTAT) 20 MG tablet Take 1 tablet (20 mg total) by mouth 2 (two) times daily. 60 tablet 2   fluconazole (DIFLUCAN) 150 MG tablet Take 1 tablet (150 mg total) by mouth once a week for 4 weeks. 4 tablet 5   fluorouracil (EFUDEX) 5 % cream Apply twice daily for 7 days to left lower leg, right ankle and chest. Apply 1st 40 g 1   Lifitegrast (XIIDRA) 5 % SOLN Place 1 drop into both eyes 2 times a day approximately 12 hours apart 180 each 3   meloxicam (MOBIC) 7.5 MG tablet Take 1 tablet (7.5 mg total) by mouth 2 (two) times daily as needed. 30 tablet 0   Minocycline HCl Micronized (ZILXI) 1.5 % FOAM Apply a thin layer to the face QHS. 30 g 2   modafinil (PROVIGIL) 100 MG tablet Take 1 tablet (100 mg total) by mouth daily. 90 tablet 1   Multiple Vitamins-Minerals (MULTIVITAMIN GUMMIES ADULTS PO) Take by mouth. VitaCraves     oxyCODONE (OXY IR/ROXICODONE) 5 MG immediate release tablet Take 1 tablet by mouth every 8 hours as  needed 90 tablet 0   oxyCODONE (OXY IR/ROXICODONE) 5 MG immediate release tablet Take 1 tablet (5 mg total) by mouth every 8 (eight) hours as needed. 15 tablet 0   oxyCODONE (OXY IR/ROXICODONE) 5 MG immediate release tablet Take 1 tablet (5 mg total) by mouth every 8 (eight) hours as needed. 90 tablet 0   pimecrolimus (ELIDEL) 1 % cream Apply topically 2 (two) times daily. Apply to scaly areas at ears 30 g 1   tretinoin (RETIN-A) 0.025 % cream APPLY A PEA SIZED AMOUNT TO THE ENTIRE FACE EVERY NIGHT AT BEDTIME 45 g 3   triamcinolone cream (KENALOG) 0.1 %  Apply twice daily to ears up to 2 weeks as needed for itching. 30 g 1   UNABLE TO FIND 2 (two) times daily as needed. Skin Medicinals(cream):  Azelaic Acid 15%, Ivermectin 1%, Metronidazole 1%     zolpidem (AMBIEN) 10 MG tablet Take 1 tablet (10 mg total) by mouth at bedtime as needed. 90 tablet 1   No facility-administered medications prior to visit.    PAST MEDICAL HISTORY: Past Medical History:  Diagnosis Date   Actinic keratosis 08/21/2021   Left medial calf.   Endometriosis    History of frequent urinary tract infections    s/p bladder dilatation (11/05)   Hypersomnia, recurrent 06/28/2014   ITP (idiopathic thrombocytopenic purpura)    Pneumonia    Rosacea     PAST SURGICAL HISTORY: Past Surgical History:  Procedure Laterality Date   bladder dilatation  11/05   CATARACT EXTRACTION W/PHACO Right 12/13/2019   Procedure: CATARACT EXTRACTION PHACO AND INTRAOCULAR LENS PLACEMENT (IOC) RIGHT VIVITY TORIC LENS 4.35 00:30.2;  Surgeon: Galen Manila, MD;  Location: [facility];  Service: Ophthalmology;  Laterality: Right;   CATARACT EXTRACTION W/PHACO Left 01/10/2020   Procedure: CATARACT EXTRACTION PHACO AND INTRAOCULAR LENS PLACEMENT (IOC) LEFT VIVITY TORIC LENS 6.19  00:32.8;  Surgeon: Galen Manila, MD;  Location: [facility];  Service: Ophthalmology;  Laterality: Left;   CESAREAN SECTION  1993   COLONOSCOPY      laproscopic surgery  10/06   found to have endometriosis   SHOULDER SURGERY Left 2008   TONSILLECTOMY  1961   VAGINAL DELIVERY  1987   VAGINAL HYSTERECTOMY  02/07    FAMILY HISTORY: Family History  Problem Relation Age of Onset   Alcohol abuse Mother    Varicose Veins Mother    Diabetes Father    Hearing loss Father    Heart disease Father    Crohn's disease Father    Crohn's disease Paternal Grandfather    Breast cancer Neg Hx    Colon cancer Neg Hx     SOCIAL HISTORY: Social History   Socioeconomic History   Marital status: Married    Spouse name: Chrissie Noa   Number of children: 2   Years of education: college   Highest education level: Not on file  Occupational History   Occupation: Pharmacist, hospital. Director    Employer: [employer]  Tobacco Use   Smoking status: Never   Smokeless tobacco: Never  Vaping Use   Vaping status: Never Used  Substance and Sexual Activity   Alcohol use: Yes    Alcohol/week: 2.0 standard drinks of alcohol    Types: 2 Glasses of wine per week   Drug use: No   Sexual activity: Not on file  Other Topics Concern   Not on file  Social History Narrative   Patient consumes 6-8 cups tea daily, is right handed   Social Determinants of Health   Financial Resource Strain: Not on file  Food Insecurity: Not on file  Transportation Needs: Not on file  Physical Activity: Not on file  Stress: Not on file  Social Connections: Unknown (03/11/2023)   Received from [facility]   Social Network    Social Network: Not on file  Intimate Partner Violence: Unknown (03/11/2023)   Received from [facility]   HITS    Physically Hurt: Not on file    Insult or Talk Down To: Not on file    Threaten Physical Harm: Not on file    Scream or Curse: Not on  file      PHYSICAL EXAM Generalized: Well developed, in no acute distress   Neurological examination  Mentation: Alert oriented to time, place, history taking. Follows all commands speech and language  fluent Cranial nerve II-XII:. Facial symmetry noted.  Reflexes: UTA  DIAGNOSTIC DATA (LABS, IMAGING, TESTING) - I reviewed patient records, labs, notes, testing and imaging myself where available.  Lab Results  Component Value Date   WBC 5.0 02/06/2023   HGB 12.7 02/06/2023   HCT 38.9 02/06/2023   MCV 92 02/06/2023   PLT 174 02/06/2023      Component Value Date/Time   NA 144 02/06/2023 1549   K 4.0 02/06/2023 1549   CL 103 02/06/2023 1549   CO2 25 02/06/2023 1549   GLUCOSE 86 02/06/2023 1549   GLUCOSE 73 04/23/2021 1433   BUN 13 02/06/2023 1549   CREATININE 0.71 02/06/2023 1549   CALCIUM 9.4 02/06/2023 1549   PROT 6.5 02/06/2023 1549   ALBUMIN 4.6 02/06/2023 1549   AST 23 02/06/2023 1549   ALT 14 02/06/2023 1549   ALKPHOS 108 02/06/2023 1549   BILITOT 0.3 02/06/2023 1549   Lab Results  Component Value Date   CHOL 165 02/23/2023   HDL 61.50 02/23/2023   LDLCALC 90 02/23/2023   TRIG 66.0 02/23/2023   CHOLHDL 3 02/23/2023   Lab Results  Component Value Date   HGBA1C 5.9 02/23/2023   No results found for: "VITAMINB12" Lab Results  Component Value Date   TSH 2.40 02/23/2023      ASSESSMENT AND PLAN 67 y.o. year old female  has a past medical history of Actinic keratosis (08/21/2021), Endometriosis, History of frequent urinary tract infections, Hypersomnia, recurrent (06/28/2014), ITP (idiopathic thrombocytopenic purpura), Pneumonia, and Rosacea. here with:  1.  Insomnia 2.  Daytime sleepiness  --Continue Ambien 10 mg at bedtime as needed -- Continue Provigil 100 mg 1 tablet daily if needed -- Advised if symptoms worsen or she develops new symptoms they should let us know -- Follow-up in 6 months or sooner if needed   . Butch Penny, MSN, NP-C 03/31/2023, 1:12 PM [facility] [address] [place], [place] [address] [phone]

## 2023-04-10 ENCOUNTER — Encounter: Payer: Self-pay | Admitting: Internal Medicine

## 2023-04-14 MED ORDER — EPINEPHRINE 0.3 MG/0.3ML IJ SOAJ: 0.3000 mg | INTRAMUSCULAR | 1 refills | Status: AC | PRN

## 2023-04-14 MED ORDER — EPINEPHRINE 0.3 MG/0.3ML IJ SOAJ
0.3000 mg | INTRAMUSCULAR | 1 refills | Status: DC | PRN
  Filled 2023-04-14: qty 2, fill #0

## 2023-04-14 NOTE — Telephone Encounter (Signed)
Notify - rx has been sent in to the pharmacy. I initially sent the rx in to [facility]. Please call them and cancel.  It appears she wanted the rx sent to [facility].  I have sent in rx to [facility].

## 2023-04-15 ENCOUNTER — Other Ambulatory Visit (HOSPITAL_COMMUNITY): Payer: Self-pay

## 2023-04-28 ENCOUNTER — Other Ambulatory Visit: Payer: Self-pay

## 2023-05-04 DIAGNOSIS — N959 Unspecified menopausal and perimenopausal disorder: Secondary | ICD-10-CM | POA: Diagnosis not present

## 2023-05-07 DIAGNOSIS — H903 Sensorineural hearing loss, bilateral: Secondary | ICD-10-CM | POA: Diagnosis not present

## 2023-05-19 DIAGNOSIS — L718 Other rosacea: Secondary | ICD-10-CM | POA: Diagnosis not present

## 2023-05-19 DIAGNOSIS — L700 Acne vulgaris: Secondary | ICD-10-CM | POA: Diagnosis not present

## 2023-05-21 ENCOUNTER — Other Ambulatory Visit (HOSPITAL_COMMUNITY): Payer: Self-pay

## 2023-05-25 DIAGNOSIS — M5416 Radiculopathy, lumbar region: Secondary | ICD-10-CM | POA: Diagnosis not present

## 2023-06-01 ENCOUNTER — Other Ambulatory Visit: Payer: Self-pay

## 2023-06-01 MED ORDER — CITALOPRAM HYDROBROMIDE 40 MG PO TABS: 40.0000 mg | ORAL_TABLET | Freq: Every day | ORAL | 1 refills | Status: DC

## 2023-06-01 NOTE — Telephone Encounter (Signed)
Phone call to pt.  Last refill for citalopram was to [facility].  Received a refill request from [facility].  Per pt insurance has changed and [facility] is preferred.  Refill sent.

## 2023-06-19 ENCOUNTER — Other Ambulatory Visit: Payer: Self-pay | Admitting: Medical Genetics

## 2023-06-19 DIAGNOSIS — Z006 Encounter for examination for normal comparison and control in clinical research program: Secondary | ICD-10-CM

## 2023-06-22 ENCOUNTER — Encounter: Payer: Self-pay | Admitting: Internal Medicine

## 2023-06-22 DIAGNOSIS — R232 Flushing: Secondary | ICD-10-CM | POA: Diagnosis not present

## 2023-06-22 DIAGNOSIS — Z79899 Other long term (current) drug therapy: Secondary | ICD-10-CM | POA: Diagnosis not present

## 2023-06-22 DIAGNOSIS — Z1330 Encounter for screening examination for mental health and behavioral disorders, unspecified: Secondary | ICD-10-CM | POA: Diagnosis not present

## 2023-06-22 NOTE — Telephone Encounter (Signed)
Can schedule an appt to discuss 

## 2023-06-23 ENCOUNTER — Telehealth (INDEPENDENT_AMBULATORY_CARE_PROVIDER_SITE_OTHER): Payer: Medicare HMO | Admitting: Internal Medicine

## 2023-06-23 ENCOUNTER — Telehealth: Payer: Self-pay

## 2023-06-23 DIAGNOSIS — R232 Flushing: Secondary | ICD-10-CM | POA: Diagnosis not present

## 2023-06-23 DIAGNOSIS — F439 Reaction to severe stress, unspecified: Secondary | ICD-10-CM | POA: Diagnosis not present

## 2023-06-23 DIAGNOSIS — F5104 Psychophysiologic insomnia: Secondary | ICD-10-CM | POA: Diagnosis not present

## 2023-06-23 NOTE — Telephone Encounter (Signed)
Called pt and informed her Dr. Lorin Picket is running behind but she will join her shortly on the Video Visit Pt verbalized understanding

## 2023-06-23 NOTE — Progress Notes (Signed)
Patient ID: Kimberly Figueroa, female   DOB: 11-11-1955, 67 y.o.   MRN: [id number]   Virtual Visit via video Note  I connected with Lona Millard by a video enabled telemedicine application and verified that I am speaking with the correct person using two identifiers. Location patient: home Location provider: work  Persons participating in the virtual visit: patient, provider  The limitations, risks, security and privacy concerns of performing an evaluation and management service by video and the availability of in person appointments have been discussed.  It has also been discussed with the patient that there may be a patient responsible charge related to this service. The patient expressed understanding and agreed to proceed.   Reason for visit: wok in appt  HPI: Work in to discuss hot flashes.  She has been seeing GYN.  Had a follow-up appointment 06/22/2023.  Had a trial of estrogen replacement therapy.  Did not notice any difference or improvement in hot flashes.  She is on citalopram.  On 40 mg dose.  Her GYN had recommended a follow-up with me to discuss.  She is continuing to have hot flashes/or feel hot.  Notices when drying her hair, etc.  Discussed adjusting dose of citalopram.  Given persistence at 40 mg would hold on increasing the dose.  We discussed other treatment options, including gabapentin, Effexor, etc.  She is interested in decreasing the dose of the citalopram and possibly tapering off, to see if this improves any of her symptoms.  Uses Ambien as needed to help with sleep.  Overall handling stress relatively well.  Tries to stay active.  Breathing stable.   ROS: See pertinent positives and negatives per HPI.  Past Medical History:  Diagnosis Date   Actinic keratosis 08/21/2021   Left medial calf.   Endometriosis    History of frequent urinary tract infections    s/p bladder dilatation (11/05)   Hypersomnia, recurrent 06/28/2014   ITP (idiopathic  thrombocytopenic purpura)    Pneumonia    Rosacea     Past Surgical History:  Procedure Laterality Date   bladder dilatation  11/05   CATARACT EXTRACTION W/PHACO Right 12/13/2019   Procedure: CATARACT EXTRACTION PHACO AND INTRAOCULAR LENS PLACEMENT (IOC) RIGHT VIVITY TORIC LENS 4.35 00:30.2;  Surgeon: Galen Manila, MD;  Location: [facility];  Service: Ophthalmology;  Laterality: Right;   CATARACT EXTRACTION W/PHACO Left 01/10/2020   Procedure: CATARACT EXTRACTION PHACO AND INTRAOCULAR LENS PLACEMENT (IOC) LEFT VIVITY TORIC LENS 6.19  00:32.8;  Surgeon: Galen Manila, MD;  Location: [facility];  Service: Ophthalmology;  Laterality: Left;   CESAREAN SECTION  1993   COLONOSCOPY     laproscopic surgery  10/06   found to have endometriosis   SHOULDER SURGERY Left 2008   TONSILLECTOMY  1961   VAGINAL DELIVERY  1987   VAGINAL HYSTERECTOMY  02/07    Family History  Problem Relation Age of Onset   Alcohol abuse Mother    Varicose Veins Mother    Diabetes Father    Hearing loss Father    Heart disease Father    Crohn's disease Father    Crohn's disease Paternal Grandfather    Breast cancer Neg Hx    Colon cancer Neg Hx     SOCIAL HX: reviewed.    Current Outpatient Medications:    calcipotriene (DOVONOX) 0.005 % cream, Apply twice daily for 7 days to left lower leg, right ankle and chest. Apply 2nd, Disp: 60 g, Rfl: 1   citalopram (  CELEXA) 40 MG tablet, Take 1 tablet (40 mg total) by mouth daily., Disp: 90 tablet, Rfl: 1   cyclobenzaprine (FLEXERIL) 5 MG tablet, Take 1 tablet by mouth 2 times daily, Disp: 180 tablet, Rfl: 3   cyclobenzaprine (FLEXERIL) 5 MG tablet, Take 1 tablet by mouth 2 times every day (90 day supply), Disp: 180 tablet, Rfl: 3   cyclobenzaprine (FLEXERIL) 5 MG tablet, Take 1 tablet (5 mg total) by mouth 2 (two) times daily., Disp: 180 tablet, Rfl: 3   doxycycline (ORACEA) 40 MG capsule, TAKE ONE CAPSULE BY MOUTH DAILY. TAKE WITH FOOD.,  Disp: 30 capsule, Rfl: 3   doxycycline (PERIOSTAT) 20 MG tablet, Take 1 tablet (20 mg total) by mouth 2 (two) times daily., Disp: 60 tablet, Rfl: 2   EPINEPHrine (EPIPEN 2-PAK) 0.3 mg/0.3 mL IJ SOAJ injection, Inject 0.3 mg into the muscle as needed for anaphylaxis., Disp: 2 each, Rfl: 1   fluconazole (DIFLUCAN) 150 MG tablet, Take 1 tablet (150 mg total) by mouth once a week for 4 weeks., Disp: 4 tablet, Rfl: 5   fluorouracil (EFUDEX) 5 % cream, Apply twice daily for 7 days to left lower leg, right ankle and chest. Apply 1st, Disp: 40 g, Rfl: 1   Lifitegrast (XIIDRA) 5 % SOLN, Place 1 drop into both eyes 2 times a day approximately 12 hours apart, Disp: 180 each, Rfl: 3   meloxicam (MOBIC) 7.5 MG tablet, Take 1 tablet (7.5 mg total) by mouth 2 (two) times daily as needed., Disp: 30 tablet, Rfl: 0   Minocycline HCl Micronized (ZILXI) 1.5 % FOAM, Apply a thin layer to the face QHS., Disp: 30 g, Rfl: 2   modafinil (PROVIGIL) 100 MG tablet, Take 1 tablet (100 mg total) by mouth daily., Disp: 90 tablet, Rfl: 1   Multiple Vitamins-Minerals (MULTIVITAMIN GUMMIES ADULTS PO), Take by mouth. VitaCraves, Disp: , Rfl:    oxyCODONE (OXY IR/ROXICODONE) 5 MG immediate release tablet, Take 1 tablet by mouth every 8 hours as needed, Disp: 90 tablet, Rfl: 0   oxyCODONE (OXY IR/ROXICODONE) 5 MG immediate release tablet, Take 1 tablet (5 mg total) by mouth every 8 (eight) hours as needed., Disp: 15 tablet, Rfl: 0   oxyCODONE (OXY IR/ROXICODONE) 5 MG immediate release tablet, Take 1 tablet (5 mg total) by mouth every 8 (eight) hours as needed., Disp: 90 tablet, Rfl: 0   pimecrolimus (ELIDEL) 1 % cream, Apply topically 2 (two) times daily. Apply to scaly areas at ears, Disp: 30 g, Rfl: 1   tretinoin (RETIN-A) 0.025 % cream, APPLY A PEA SIZED AMOUNT TO THE ENTIRE FACE EVERY NIGHT AT BEDTIME, Disp: 45 g, Rfl: 3   triamcinolone cream (KENALOG) 0.1 %, Apply twice daily to ears up to 2 weeks as needed for itching., Disp: 30 g,  Rfl: 1   UNABLE TO FIND, 2 (two) times daily as needed. Skin Medicinals(cream):  Azelaic Acid 15%, Ivermectin 1%, Metronidazole 1%, Disp: , Rfl:    zolpidem (AMBIEN) 10 MG tablet, Take 1 tablet (10 mg total) by mouth at bedtime as needed., Disp: 90 tablet, Rfl: 1  EXAM:  GENERAL: alert, oriented, appears well and in no acute distress  HEENT: atraumatic, conjunttiva clear, no obvious abnormalities on inspection of external nose and ears  NECK: normal movements of the head and neck  LUNGS: on inspection no signs of respiratory distress, breathing rate appears normal, no obvious gross SOB, gasping or wheezing  CV: no obvious cyanosis  PSYCH/NEURO: pleasant and cooperative, no obvious depression  or anxiety, speech and thought processing grossly intact  ASSESSMENT AND PLAN:  Discussed the following assessment and plan:  Problem List Items Addressed This Visit     Chronic insomnia    Had been followed by neurology.  Recommended continuing ambien.       Hot flashes - Primary    Persistent hot flashes as outlined.  Did not appear to improve with estrogen replacement therapy.  Currently on 40 mg of citalopram daily.  Persistent symptoms.  Discussed treatment options.  After discussion, it was decided to decrease her citalopram down to 20 mg/day.  Will gradually taper to this dose.  She will alternate 40 mg with 20 mg every other day.  Call with update.  She would like to see if tapering this medication or tapering off of the medication - would improve her symptoms.  Call with update.      Stress    Overall appears to be handling things well. Follow.        Return if symptoms worsen or fail to improve.   I discussed the assessment and treatment plan with the patient. The patient was provided an opportunity to ask questions and all were answered. The patient agreed with the plan and demonstrated an understanding of the instructions.   The patient was advised to call back or seek an  in-person evaluation if the symptoms worsen or if the condition fails to improve as anticipated.    Dale Chaffee, MD

## 2023-06-28 ENCOUNTER — Encounter: Payer: Self-pay | Admitting: Internal Medicine

## 2023-06-28 NOTE — Assessment & Plan Note (Signed)
Persistent hot flashes as outlined.  Did not appear to improve with estrogen replacement therapy.  Currently on 40 mg of citalopram daily.  Persistent symptoms.  Discussed treatment options.  After discussion, it was decided to decrease her citalopram down to 20 mg/day.  Will gradually taper to this dose.  She will alternate 40 mg with 20 mg every other day.  Call with update.  She would like to see if tapering this medication or tapering off of the medication - would improve her symptoms.  Call with update.

## 2023-06-28 NOTE — Assessment & Plan Note (Signed)
Had been followed by neurology.  Recommended continuing ambien.

## 2023-06-28 NOTE — Assessment & Plan Note (Signed)
Overall appears to be handling things well.  Follow.  ?

## 2023-06-30 ENCOUNTER — Ambulatory Visit: Payer: 59 | Admitting: Dermatology

## 2023-07-06 ENCOUNTER — Encounter: Payer: Self-pay | Admitting: Internal Medicine

## 2023-07-07 NOTE — Telephone Encounter (Signed)
Please call her and notify - if she has only had a couple of days with hot flashes since we spoke, I would like to see if things level off and continue with the plan as we discussed.  Also, please document, other medications she has tried previously.  (For example gabapentin?, effexor?, etc).

## 2023-07-07 NOTE — Telephone Encounter (Signed)
Patient aware and does not remember taking any other meds. Will continue celexa 20 mg for now.

## 2023-07-13 ENCOUNTER — Other Ambulatory Visit (HOSPITAL_COMMUNITY): Payer: Self-pay

## 2023-07-15 ENCOUNTER — Other Ambulatory Visit: Payer: Self-pay | Admitting: Adult Health

## 2023-07-27 ENCOUNTER — Other Ambulatory Visit: Payer: Self-pay | Admitting: Adult Health

## 2023-07-27 NOTE — Telephone Encounter (Signed)
Pt is asking if her  zolpidem (AMBIEN) 10 MG tablet can be sent thru [facility] Mail [facility]

## 2023-07-28 MED ORDER — ZOLPIDEM TARTRATE 10 MG PO TABS: 10.0000 mg | ORAL_TABLET | Freq: Every evening | ORAL | 1 refills | Status: DC | PRN

## 2023-07-28 NOTE — Telephone Encounter (Signed)
Last visit: 03/31/23  Next visit: 03/22/24  Per epic refill history, last fill:   There were two 90 day refills sent to pharmacy on 03/31/23. The refill prior was sent in January 2024 and would have expired in July.   Rx refill sent to MM NP

## 2023-08-03 NOTE — Telephone Encounter (Signed)
Huntley Dec from [facility] reports that pt did not get Rx from what they shipped on 11-19 with a delivery of 11-22, pt's bottle is from August.  Huntley Dec states to avoid this from showing as another refill it is a request to reprint label and for a recount so they can send again.  Huntley Dec can be reached at [phone]  xt [phone]

## 2023-08-03 NOTE — Telephone Encounter (Signed)
I called Huntley Dec back and LVM asking for call back to clarify what it is they need from [place]. I advised we sent Zolpidem Rx on 07/28/23 for 90 days plus 1 refill. Left office number for call back.

## 2023-08-04 ENCOUNTER — Encounter: Payer: Self-pay | Admitting: Internal Medicine

## 2023-08-04 DIAGNOSIS — Z1211 Encounter for screening for malignant neoplasm of colon: Secondary | ICD-10-CM | POA: Diagnosis not present

## 2023-08-04 DIAGNOSIS — R1310 Dysphagia, unspecified: Secondary | ICD-10-CM | POA: Insufficient documentation

## 2023-08-04 DIAGNOSIS — R1319 Other dysphagia: Secondary | ICD-10-CM | POA: Diagnosis not present

## 2023-08-04 NOTE — Telephone Encounter (Signed)
I spoke with Janeann Forehand (pharmacist) at [facility] as well as the patient. I gave verbal ok from [facility] NP that it is ok to ship out another refill of Ambien since patient reports she didn't get her last shipment. The pharmacy is saying they have a confirmed delivery in mailbox on 11/22 but the patient is saying she didn't receive it. Patient has agreed to get future refills from a local pharmacy instead of mail order pharmacy.

## 2023-08-10 NOTE — Telephone Encounter (Signed)
I spoke with [facility]. They said the medication package is still in transit and the tracking number for [employer] is [id number]. Signature is required.

## 2023-08-11 DIAGNOSIS — L718 Other rosacea: Secondary | ICD-10-CM | POA: Diagnosis not present

## 2023-08-11 DIAGNOSIS — L700 Acne vulgaris: Secondary | ICD-10-CM | POA: Diagnosis not present

## 2023-08-13 ENCOUNTER — Telehealth: Payer: Self-pay | Admitting: Adult Health

## 2023-08-13 NOTE — Telephone Encounter (Signed)
Pharmacist Tammy has called to report that they shipped out pt's zolpidem (AMBIEN) 10 MG  thru [employer] according to tracking [id number]) it is still in transit, they are asking if a 10 day supply can be sent to pt at [facility] [phone]

## 2023-08-14 DIAGNOSIS — K2970 Gastritis, unspecified, without bleeding: Secondary | ICD-10-CM | POA: Diagnosis not present

## 2023-08-14 DIAGNOSIS — K449 Diaphragmatic hernia without obstruction or gangrene: Secondary | ICD-10-CM | POA: Diagnosis not present

## 2023-08-14 DIAGNOSIS — R1319 Other dysphagia: Secondary | ICD-10-CM | POA: Diagnosis not present

## 2023-08-14 DIAGNOSIS — R12 Heartburn: Secondary | ICD-10-CM | POA: Diagnosis not present

## 2023-08-14 DIAGNOSIS — R1310 Dysphagia, unspecified: Secondary | ICD-10-CM | POA: Diagnosis not present

## 2023-08-14 DIAGNOSIS — K440 Diaphragmatic hernia with obstruction, without gangrene: Secondary | ICD-10-CM | POA: Diagnosis not present

## 2023-08-14 DIAGNOSIS — K2210 Ulcer of esophagus without bleeding: Secondary | ICD-10-CM | POA: Diagnosis not present

## 2023-08-14 DIAGNOSIS — K219 Gastro-esophageal reflux disease without esophagitis: Secondary | ICD-10-CM | POA: Diagnosis not present

## 2023-08-14 DIAGNOSIS — K3189 Other diseases of stomach and duodenum: Secondary | ICD-10-CM | POA: Diagnosis not present

## 2023-08-14 MED ORDER — ZOLPIDEM TARTRATE 10 MG PO TABS: 10.0000 mg | ORAL_TABLET | Freq: Every evening | ORAL | 0 refills | Status: DC | PRN

## 2023-08-27 DIAGNOSIS — M48061 Spinal stenosis, lumbar region without neurogenic claudication: Secondary | ICD-10-CM | POA: Diagnosis not present

## 2023-08-27 DIAGNOSIS — M4727 Other spondylosis with radiculopathy, lumbosacral region: Secondary | ICD-10-CM | POA: Diagnosis not present

## 2023-08-27 DIAGNOSIS — M5116 Intervertebral disc disorders with radiculopathy, lumbar region: Secondary | ICD-10-CM | POA: Diagnosis not present

## 2023-08-27 DIAGNOSIS — M47816 Spondylosis without myelopathy or radiculopathy, lumbar region: Secondary | ICD-10-CM | POA: Diagnosis not present

## 2023-08-28 ENCOUNTER — Encounter: Payer: Self-pay | Admitting: Internal Medicine

## 2023-08-28 DIAGNOSIS — N281 Cyst of kidney, acquired: Secondary | ICD-10-CM

## 2023-08-28 DIAGNOSIS — K769 Liver disease, unspecified: Secondary | ICD-10-CM

## 2023-08-31 ENCOUNTER — Ambulatory Visit: Payer: Medicare HMO | Admitting: Internal Medicine

## 2023-08-31 NOTE — Telephone Encounter (Signed)
 Patient agreeable to CT scan and referral.

## 2023-08-31 NOTE — Telephone Encounter (Signed)
 Orders have been placed for CT scan and urology referral.

## 2023-09-01 NOTE — Telephone Encounter (Signed)
 [facility] ([facility]) follow up call shipped order via [employer]. Tracking shows have not moved since 12:20 pm. Calling to send a replacement to her. Contact information: [phone] ext [phone]

## 2023-09-02 ENCOUNTER — Encounter: Payer: Self-pay | Admitting: Internal Medicine

## 2023-09-03 NOTE — Telephone Encounter (Signed)
   I called [facility] and also checked tracking myself on [employer] website. Spoke with pharmacist, Jeffie. Package is stuck in [place] (since August 14, 2023). Megan NP is aware and given verbal ok for [facility] to ship out a replacement for the replacement Ambien . They have also canceled future prescriptions for this medication per pt request to fill elsewhere going forward. I have asked on pt's behalf for expedited shipment. They are going to call pt and discuss the next steps.

## 2023-09-08 DIAGNOSIS — N281 Cyst of kidney, acquired: Secondary | ICD-10-CM | POA: Diagnosis not present

## 2023-09-22 DIAGNOSIS — N281 Cyst of kidney, acquired: Secondary | ICD-10-CM | POA: Diagnosis not present

## 2023-10-01 ENCOUNTER — Encounter: Payer: Self-pay | Admitting: Internal Medicine

## 2023-10-01 DIAGNOSIS — R932 Abnormal findings on diagnostic imaging of liver and biliary tract: Secondary | ICD-10-CM

## 2023-10-01 NOTE — Telephone Encounter (Signed)
 Please call and notify - I did review her CT scan.  Regarding the liver, see if she is agreeable to f/u with GI to further evaluate the liver and see if any further w/up or f/u warranted.

## 2023-10-05 ENCOUNTER — Encounter: Payer: 59 | Admitting: Dermatology

## 2023-10-06 DIAGNOSIS — M5416 Radiculopathy, lumbar region: Secondary | ICD-10-CM | POA: Diagnosis not present

## 2023-10-07 ENCOUNTER — Encounter: Payer: 59 | Admitting: Dermatology

## 2023-10-07 NOTE — Telephone Encounter (Signed)
Patient is agreeable to see GI but would like to find one local to her and send my chart to let us know where to send referral.

## 2023-10-07 NOTE — Telephone Encounter (Signed)
Copied from CRM [id number]. Topic: General - Call Back - No Documentation >> Oct 07, 2023 10:17 AM Kimberly Figueroa wrote: Reason for CRM: *Patient is returning missed call from nurse Bethann Berkshire* Patient states for her gastroenterology referral, she would like this sent to Dr. Roel Cluck in [place], [place]

## 2023-10-07 NOTE — Telephone Encounter (Signed)
Order placed for referral to GI

## 2023-10-13 DIAGNOSIS — K7689 Other specified diseases of liver: Secondary | ICD-10-CM | POA: Diagnosis not present

## 2023-10-13 DIAGNOSIS — Z1211 Encounter for screening for malignant neoplasm of colon: Secondary | ICD-10-CM | POA: Diagnosis not present

## 2023-10-13 DIAGNOSIS — T50905A Adverse effect of unspecified drugs, medicaments and biological substances, initial encounter: Secondary | ICD-10-CM | POA: Diagnosis not present

## 2023-11-03 ENCOUNTER — Ambulatory Visit: Payer: Medicare HMO | Admitting: Internal Medicine

## 2023-11-03 NOTE — Progress Notes (Deleted)
 Subjective:    Patient ID: Kimberly Figueroa, female    DOB: 1956-04-27, 68 y.o.   MRN: [id number]  Patient here for No chief complaint on file.   HPI Here for a scheduled follow up. Recently evaluated - CT scan revealed liver cyst and cysts of kidneys.    Past Medical History:  Diagnosis Date   Actinic keratosis 08/21/2021   Left medial calf.   Endometriosis    History of frequent urinary tract infections    s/p bladder dilatation (11/05)   Hypersomnia, recurrent 06/28/2014   ITP (idiopathic thrombocytopenic purpura)    Pneumonia    Rosacea    Past Surgical History:  Procedure Laterality Date   bladder dilatation  11/05   CATARACT EXTRACTION W/PHACO Right 12/13/2019   Procedure: CATARACT EXTRACTION PHACO AND INTRAOCULAR LENS PLACEMENT (IOC) RIGHT VIVITY TORIC LENS 4.35 00:30.2;  Surgeon: Galen Manila, MD;  Location: [facility];  Service: Ophthalmology;  Laterality: Right;   CATARACT EXTRACTION W/PHACO Left 01/10/2020   Procedure: CATARACT EXTRACTION PHACO AND INTRAOCULAR LENS PLACEMENT (IOC) LEFT VIVITY TORIC LENS 6.19  00:32.8;  Surgeon: Galen Manila, MD;  Location: [facility];  Service: Ophthalmology;  Laterality: Left;   CESAREAN SECTION  1993   COLONOSCOPY     laproscopic surgery  10/06   found to have endometriosis   SHOULDER SURGERY Left 2008   TONSILLECTOMY  1961   VAGINAL DELIVERY  1987   VAGINAL HYSTERECTOMY  02/07   Family History  Problem Relation Age of Onset   Alcohol abuse Mother    Varicose Veins Mother    Diabetes Father    Hearing loss Father    Heart disease Father    Crohn's disease Father    Crohn's disease Paternal Grandfather    Breast cancer Neg Hx    Colon cancer Neg Hx    Social History   Socioeconomic History   Marital status: Married    Spouse name: Chrissie Noa   Number of children: 2   Years of education: college   Highest education level: Not on file  Occupational History   Occupation: Pharmacist, hospital. Director     Employer: [employer]  Tobacco Use   Smoking status: Never   Smokeless tobacco: Never  Vaping Use   Vaping status: Never Used  Substance and Sexual Activity   Alcohol use: Yes    Alcohol/week: 2.0 standard drinks of alcohol    Types: 2 Glasses of wine per week   Drug use: No   Sexual activity: Not on file  Other Topics Concern   Not on file  Social History Narrative   Patient consumes 6-8 cups tea daily, is right handed   Social Drivers of Health   Financial Resource Strain: Low Risk  (06/19/2023)   Received from [facility]   Overall Financial Resource Strain (CARDIA)    Difficulty of Paying Living Expenses: Not hard at all  Food Insecurity: No Food Insecurity (06/19/2023)   Received from [facility]   Hunger Vital Sign    Worried About Running Out of Food in the Last Year: Never true    Ran Out of Food in the Last Year: Never true  Transportation Needs: No Transportation Needs (06/19/2023)   Received from [facility] - Transportation    Lack of Transportation (Medical): No    Lack of Transportation (Non-Medical): No  Physical Activity: Insufficiently Active (06/19/2023)   Received from [facility]   Exercise Vital Sign    Days  of Exercise per Week: 2 days    Minutes of Exercise per Session: 30 min  Stress: No Stress Concern Present (06/19/2023)   Received from [facility] - Occupational Stress Questionnaire    Feeling of Stress : Only a little  Social Connections: Moderately Integrated (06/19/2023)   Received from [facility]   Social Network    How would you rate your social network (family, work, friends)?: Adequate participation with social networks     Review of Systems     Objective:     There were no vitals taken for this visit. Wt Readings from Last 3 Encounters:  02/23/23 150 lb (68 kg)  07/25/21 150 lb (68 kg)  04/23/21 154 lb 12.8 oz (70.2 kg)    Physical Exam  {Perform  Simple Foot Exam  Perform Detailed exam:1} {Insert foot Exam (Optional):30965}   Outpatient Encounter Medications as of 11/03/2023  Medication Sig   calcipotriene (DOVONOX) 0.005 % cream Apply twice daily for 7 days to left lower leg, right ankle and chest. Apply 2nd   citalopram (CELEXA) 40 MG tablet Take 1 tablet (40 mg total) by mouth daily.   cyclobenzaprine (FLEXERIL) 5 MG tablet Take 1 tablet by mouth 2 times daily   cyclobenzaprine (FLEXERIL) 5 MG tablet Take 1 tablet by mouth 2 times every day (90 day supply)   cyclobenzaprine (FLEXERIL) 5 MG tablet Take 1 tablet (5 mg total) by mouth 2 (two) times daily.   doxycycline (ORACEA) 40 MG capsule TAKE ONE CAPSULE BY MOUTH DAILY. TAKE WITH FOOD.   doxycycline (PERIOSTAT) 20 MG tablet Take 1 tablet (20 mg total) by mouth 2 (two) times daily.   EPINEPHrine (EPIPEN 2-PAK) 0.3 mg/0.3 mL IJ SOAJ injection Inject 0.3 mg into the muscle as needed for anaphylaxis.   fluconazole (DIFLUCAN) 150 MG tablet Take 1 tablet (150 mg total) by mouth once a week for 4 weeks.   fluorouracil (EFUDEX) 5 % cream Apply twice daily for 7 days to left lower leg, right ankle and chest. Apply 1st   Lifitegrast (XIIDRA) 5 % SOLN Place 1 drop into both eyes 2 times a day approximately 12 hours apart   meloxicam (MOBIC) 7.5 MG tablet Take 1 tablet (7.5 mg total) by mouth 2 (two) times daily as needed.   Minocycline HCl Micronized (ZILXI) 1.5 % FOAM Apply a thin layer to the face QHS.   modafinil (PROVIGIL) 100 MG tablet Take 1 tablet (100 mg total) by mouth daily.   Multiple Vitamins-Minerals (MULTIVITAMIN GUMMIES ADULTS PO) Take by mouth. VitaCraves   oxyCODONE (OXY IR/ROXICODONE) 5 MG immediate release tablet Take 1 tablet by mouth every 8 hours as needed   oxyCODONE (OXY IR/ROXICODONE) 5 MG immediate release tablet Take 1 tablet (5 mg total) by mouth every 8 (eight) hours as needed.   oxyCODONE (OXY IR/ROXICODONE) 5 MG immediate release tablet Take 1 tablet (5 mg total)  by mouth every 8 (eight) hours as needed.   pimecrolimus (ELIDEL) 1 % cream Apply topically 2 (two) times daily. Apply to scaly areas at ears   tretinoin (RETIN-A) 0.025 % cream APPLY A PEA SIZED AMOUNT TO THE ENTIRE FACE EVERY NIGHT AT BEDTIME   triamcinolone cream (KENALOG) 0.1 % Apply twice daily to ears up to 2 weeks as needed for itching.   UNABLE TO FIND 2 (two) times daily as needed. Skin Medicinals(cream):  Azelaic Acid 15%, Ivermectin 1%, Metronidazole 1%   zolpidem (AMBIEN) 10 MG tablet Take  1 tablet (10 mg total) by mouth at bedtime as needed.   No facility-administered encounter medications on file as of 11/03/2023.     Lab Results  Component Value Date   WBC 5.0 02/06/2023   HGB 12.7 02/06/2023   HCT 38.9 02/06/2023   PLT 174 02/06/2023   GLUCOSE 86 02/06/2023   CHOL 165 02/23/2023   TRIG 66.0 02/23/2023   HDL 61.50 02/23/2023   LDLCALC 90 02/23/2023   ALT 14 02/06/2023   AST 23 02/06/2023   NA 144 02/06/2023   K 4.0 02/06/2023   CL 103 02/06/2023   CREATININE 0.71 02/06/2023   BUN 13 02/06/2023   CO2 25 02/06/2023   TSH 2.40 02/23/2023   HGBA1C 5.9 02/23/2023    No results found.     Assessment & Plan:  There are no diagnoses linked to this encounter.   Dale Munster, MD

## 2023-11-05 ENCOUNTER — Ambulatory Visit (INDEPENDENT_AMBULATORY_CARE_PROVIDER_SITE_OTHER): Admitting: Internal Medicine

## 2023-11-05 ENCOUNTER — Encounter: Payer: Self-pay | Admitting: Internal Medicine

## 2023-11-05 VITALS — BP 104/70 | HR 77 | Temp 98.0°F | Ht 67.0 in | Wt 153.4 lb

## 2023-11-05 DIAGNOSIS — Z124 Encounter for screening for malignant neoplasm of cervix: Secondary | ICD-10-CM

## 2023-11-05 DIAGNOSIS — N951 Menopausal and female climacteric states: Secondary | ICD-10-CM | POA: Diagnosis not present

## 2023-11-05 DIAGNOSIS — L299 Pruritus, unspecified: Secondary | ICD-10-CM | POA: Diagnosis not present

## 2023-11-05 DIAGNOSIS — F439 Reaction to severe stress, unspecified: Secondary | ICD-10-CM | POA: Diagnosis not present

## 2023-11-05 DIAGNOSIS — R739 Hyperglycemia, unspecified: Secondary | ICD-10-CM

## 2023-11-05 DIAGNOSIS — D696 Thrombocytopenia, unspecified: Secondary | ICD-10-CM | POA: Diagnosis not present

## 2023-11-05 NOTE — Progress Notes (Signed)
 Subjective:    Patient ID: Kimberly Figueroa, female    DOB: [birth year], 68 y.o.   MRN: [id number]  Patient here for  Chief Complaint  Patient presents with   Stress   Ear Problem   Hot Flashes    HPI Here for a scheduled follow up. Has been having increased hot flashes. Saw gyn 06/22/23. Trial of estrogen therapy. Did not notice a difference in symptoms. Citalopram decreased to 20mg  q day previous visit. Feeling better. Doing tai chi. Has adjusted diet. Eating protein. Monitoring carbs. Decreased sweets. Discussed weight loss management. Reports itching -ears. Has tried varied creams. Dermatology prescribed. Recently evaluated by GI. S/p EGD (08/14/23) - pill esophagitis from doxycycline.    Past Medical History:  Diagnosis Date   Actinic keratosis 08/21/2021   Left medial calf.   Endometriosis    History of frequent urinary tract infections    s/p bladder dilatation (11/05)   Hypersomnia, recurrent 06/28/2014   ITP (idiopathic thrombocytopenic purpura)    Pneumonia    Rosacea    Past Surgical History:  Procedure Laterality Date   bladder dilatation  11/05   CATARACT EXTRACTION W/PHACO Right 12/13/2019   Procedure: CATARACT EXTRACTION PHACO AND INTRAOCULAR LENS PLACEMENT (IOC) RIGHT VIVITY TORIC LENS 4.35 00:30.2;  Surgeon: Galen Manila, MD;  Location: [facility];  Service: Ophthalmology;  Laterality: Right;   CATARACT EXTRACTION W/PHACO Left 01/10/2020   Procedure: CATARACT EXTRACTION PHACO AND INTRAOCULAR LENS PLACEMENT (IOC) LEFT VIVITY TORIC LENS 6.19  00:32.8;  Surgeon: Galen Manila, MD;  Location[facility];  Service: Ophthalmology;  Laterality: Left;   CESAREAN SECTION  1993   COLONOSCOPY     laproscopic surgery  10/06   found to have endometriosis   SHOULDER SURGERY Left 2008   TONSILLECTOMY  1961   VAGINAL DELIVERY  1987   VAGINAL HYSTERECTOMY  02/07   Family History  Problem Relation Age of Onset   Alcohol abuse Mother     Varicose Veins Mother    Diabetes Father    Hearing loss Father    Heart disease Father    Crohn's disease Father    Crohn's disease Paternal Grandfather    Breast cancer Neg Hx    Colon cancer Neg Hx    Social History   Socioeconomic History   Marital status: Married    Spouse name: Chrissie Noa   Number of children: 2   Years of education: college   Highest education level: Not on file  Occupational History   Occupation: Pharmacist, hospital. Director    Employer: [employer]  Tobacco Use   Smoking status: Never   Smokeless tobacco: Never  Vaping Use   Vaping status: Never Used  Substance and Sexual Activity   Alcohol use: Yes    Alcohol/week: 2.0 standard drinks of alcohol    Types: 2 Glasses of wine per week   Drug use: No   Sexual activity: Not on file  Other Topics Concern   Not on file  Social History Narrative   Patient consumes 6-8 cups tea daily, is right handed   Social Drivers of Health   Financial Resource Strain: Low Risk  (06/19/2023)   Received from [facility]   Overall Financial Resource Strain (CARDIA)    Difficulty of Paying Living Expenses: Not hard at all  Food Insecurity: No Food Insecurity (06/19/2023)   Received from [facility]   Hunger Vital Sign    Worried About Running Out of Food in the Last Year: Never  true    Ran Out of Food in the Last Year: Never true  Transportation Needs: No Transportation Needs (06/19/2023)   Received from [facility] - Transportation    Lack of Transportation (Medical): No    Lack of Transportation (Non-Medical): No  Physical Activity: Insufficiently Active (06/19/2023)   Received from [facility]   Exercise Vital Sign    Days of Exercise per Week: 2 days    Minutes of Exercise per Session: 30 min  Stress: No Stress Concern Present (06/19/2023)   Received from [facility] - Occupational Stress Questionnaire    Feeling of Stress : Only a little  Social  Connections: Moderately Integrated (06/19/2023)   Received from [facility]   Social Network    How would you rate your social network (family, work, friends)?: Adequate participation with social networks     Review of Systems  Constitutional:  Negative for appetite change and unexpected weight change.  HENT:  Negative for congestion and sinus pressure.        Ears itching as outlined.   Respiratory:  Negative for cough, chest tightness and shortness of breath.   Cardiovascular:  Negative for chest pain, palpitations and leg swelling.  Gastrointestinal:  Negative for abdominal pain, diarrhea, nausea and vomiting.  Genitourinary:  Negative for difficulty urinating and dysuria.  Musculoskeletal:  Negative for joint swelling and myalgias.  Skin:  Negative for color change and rash.  Neurological:  Negative for dizziness and headaches.  Psychiatric/Behavioral:  Negative for agitation and dysphoric mood.        Objective:     BP 104/70   Pulse 77   Temp 98 F (36.7 C) (Oral)   Ht 5\' 7"  (1.702 m)   Wt 153 lb 6.4 oz (69.6 kg)   SpO2 98%   BMI 24.03 kg/m  Wt Readings from Last 3 Encounters:  11/05/23 153 lb 6.4 oz (69.6 kg)  02/23/23 150 lb (68 kg)  07/25/21 150 lb (68 kg)    Physical Exam Vitals reviewed.  Constitutional:      General: She is not in acute distress.    Appearance: Normal appearance.  HENT:     Head: Normocephalic and atraumatic.     Right Ear: External ear normal.     Left Ear: External ear normal.     Ears:     Comments: Some irritation/minimal erythema - opening ear/canal.     Mouth/Throat:     Pharynx: No oropharyngeal exudate or posterior oropharyngeal erythema.  Eyes:     General: No scleral icterus.       Right eye: No discharge.        Left eye: No discharge.     Conjunctiva/sclera: Conjunctivae normal.  Neck:     Thyroid: No thyromegaly.  Cardiovascular:     Rate and Rhythm: Normal rate and regular rhythm.  Pulmonary:     Effort: No  respiratory distress.     Breath sounds: Normal breath sounds. No wheezing.  Abdominal:     General: Bowel sounds are normal.     Palpations: Abdomen is soft.     Tenderness: There is no abdominal tenderness.  Musculoskeletal:        General: No swelling or tenderness.     Cervical back: Neck supple. No tenderness.  Lymphadenopathy:     Cervical: No cervical adenopathy.  Skin:    Findings: No erythema or rash.  Neurological:  Mental Status: She is alert.  Psychiatric:        Mood and Affect: Mood normal.        Behavior: Behavior normal.         Outpatient Encounter Medications as of 11/05/2023  Medication Sig   citalopram (CELEXA) 20 MG tablet Take 20 mg by mouth daily.   doxycycline (ORACEA) 40 MG capsule TAKE ONE CAPSULE BY MOUTH DAILY. TAKE WITH FOOD. (Patient taking differently: Take 100 mg by mouth every morning.)   EPINEPHrine (EPIPEN 2-PAK) 0.3 mg/0.3 mL IJ SOAJ injection Inject 0.3 mg into the muscle as needed for anaphylaxis.   meloxicam (MOBIC) 7.5 MG tablet Take 1 tablet (7.5 mg total) by mouth 2 (two) times daily as needed.   Minocycline HCl Micronized (ZILXI) 1.5 % FOAM Apply a thin layer to the face QHS.   modafinil (PROVIGIL) 100 MG tablet Take 1 tablet (100 mg total) by mouth daily.   Multiple Vitamins-Minerals (MULTIVITAMIN GUMMIES ADULTS PO) Take by mouth. VitaCraves   oxyCODONE (OXY IR/ROXICODONE) 5 MG immediate release tablet Take 1 tablet by mouth every 8 hours as needed   oxyCODONE (OXY IR/ROXICODONE) 5 MG immediate release tablet Take 1 tablet (5 mg total) by mouth every 8 (eight) hours as needed.   oxyCODONE (OXY IR/ROXICODONE) 5 MG immediate release tablet Take 1 tablet (5 mg total) by mouth every 8 (eight) hours as needed.   pimecrolimus (ELIDEL) 1 % cream Apply topically 2 (two) times daily. Apply to scaly areas at ears   tretinoin (RETIN-A) 0.025 % cream APPLY A PEA SIZED AMOUNT TO THE ENTIRE FACE EVERY NIGHT AT BEDTIME   UNABLE TO FIND 2 (two)  times daily as needed. Skin Medicinals(cream):  Azelaic Acid 15%, Ivermectin 1%, Metronidazole 1%   zolpidem (AMBIEN) 10 MG tablet Take 1 tablet (10 mg total) by mouth at bedtime as needed.   [DISCONTINUED] triamcinolone cream (KENALOG) 0.1 % Apply twice daily to ears up to 2 weeks as needed for itching.   [DISCONTINUED] calcipotriene (DOVONOX) 0.005 % cream Apply twice daily for 7 days to left lower leg, right ankle and chest. Apply 2nd (Patient not taking: Reported on 11/05/2023)   [DISCONTINUED] citalopram (CELEXA) 40 MG tablet Take 1 tablet (40 mg total) by mouth daily.   [DISCONTINUED] cyclobenzaprine (FLEXERIL) 5 MG tablet Take 1 tablet by mouth 2 times daily (Patient not taking: Reported on 11/05/2023)   [DISCONTINUED] cyclobenzaprine (FLEXERIL) 5 MG tablet Take 1 tablet by mouth 2 times every day (90 day supply) (Patient not taking: Reported on 11/05/2023)   [DISCONTINUED] cyclobenzaprine (FLEXERIL) 5 MG tablet Take 1 tablet (5 mg total) by mouth 2 (two) times daily. (Patient not taking: Reported on 11/05/2023)   [DISCONTINUED] doxycycline (PERIOSTAT) 20 MG tablet Take 1 tablet (20 mg total) by mouth 2 (two) times daily. (Patient not taking: Reported on 11/05/2023)   [DISCONTINUED] fluconazole (DIFLUCAN) 150 MG tablet Take 1 tablet (150 mg total) by mouth once a week for 4 weeks.   [DISCONTINUED] fluorouracil (EFUDEX) 5 % cream Apply twice daily for 7 days to left lower leg, right ankle and chest. Apply 1st   [DISCONTINUED] Lifitegrast (XIIDRA) 5 % SOLN Place 1 drop into both eyes 2 times a day approximately 12 hours apart   No facility-administered encounter medications on file as of 11/05/2023.     Lab Results  Component Value Date   WBC 5.0 02/06/2023   HGB 12.7 02/06/2023   HCT 38.9 02/06/2023   PLT 174 02/06/2023   GLUCOSE  86 02/06/2023   CHOL 165 02/23/2023   TRIG 66.0 02/23/2023   HDL 61.50 02/23/2023   LDLCALC 90 02/23/2023   ALT 14 02/06/2023   AST 23 02/06/2023   NA 144  02/06/2023   K 4.0 02/06/2023   CL 103 02/06/2023   CREATININE 0.71 02/06/2023   BUN 13 02/06/2023   CO2 25 02/06/2023   TSH 2.40 02/23/2023   HGBA1C 5.9 02/23/2023       Assessment & Plan:  Thrombocytopenia (HCC) Assessment & Plan: Follow cbc to confirm wnl.    Cervical cancer screening  Stress Assessment & Plan: Overall appears to be handling things well. Feeling better. Continue current dose citalopram.    Menopausal symptoms Assessment & Plan: On citalopram 20mg  q day now. Feeling better. Follow.    Hyperglycemia Assessment & Plan: Low carb diet and exercise.  Follow met b and a1c.     Ear itching Assessment & Plan: Exam as outlined. Triamcinolone cream as directed. Follow. Cautioned about steroid cream.       Dale Craig, MD

## 2023-11-05 NOTE — Patient Instructions (Signed)
Triamcinolone cream.

## 2023-11-06 ENCOUNTER — Encounter: Payer: Self-pay | Admitting: Internal Medicine

## 2023-11-06 ENCOUNTER — Other Ambulatory Visit (HOSPITAL_COMMUNITY): Payer: Self-pay

## 2023-11-06 ENCOUNTER — Other Ambulatory Visit: Payer: Self-pay

## 2023-11-06 DIAGNOSIS — Z0184 Encounter for antibody response examination: Secondary | ICD-10-CM

## 2023-11-06 DIAGNOSIS — L219 Seborrheic dermatitis, unspecified: Secondary | ICD-10-CM

## 2023-11-06 MED ORDER — TRIAMCINOLONE ACETONIDE 0.1 % EX CREA
TOPICAL_CREAM | CUTANEOUS | 0 refills | Status: AC
  Filled 2023-11-06: qty 30, 14d supply, fill #0

## 2023-11-06 NOTE — Telephone Encounter (Signed)
Rx sent in for triamcinolone cream.

## 2023-11-06 NOTE — Telephone Encounter (Signed)
 Order placed for MMR titer to be drawn at [facility]

## 2023-11-10 DIAGNOSIS — Z0184 Encounter for antibody response examination: Secondary | ICD-10-CM | POA: Diagnosis not present

## 2023-11-11 LAB — MEASLES/MUMPS/RUBELLA IMMUNITY
MUMPS ABS, IGG: 98.8 [AU]/ml (ref >10.9)
RUBEOLA AB, IGG: 190 [AU]/ml (ref >16.4)
Rubella Antibodies, IGG: 7.86 {index} (ref >0.99)

## 2023-11-12 ENCOUNTER — Encounter: Payer: Self-pay | Admitting: Internal Medicine

## 2023-11-15 ENCOUNTER — Encounter: Payer: Self-pay | Admitting: Internal Medicine

## 2023-11-15 DIAGNOSIS — L299 Pruritus, unspecified: Secondary | ICD-10-CM | POA: Insufficient documentation

## 2023-11-15 NOTE — Assessment & Plan Note (Signed)
 Exam as outlined. Triamcinolone cream as directed. Follow. Cautioned about steroid cream.

## 2023-11-15 NOTE — Assessment & Plan Note (Signed)
 Low carb diet and exercise.  Follow met b and a1c.

## 2023-11-15 NOTE — Assessment & Plan Note (Signed)
 Overall appears to be handling things well. Feeling better. Continue current dose citalopram.

## 2023-11-15 NOTE — Assessment & Plan Note (Signed)
 On citalopram 20mg  q day now. Feeling better. Follow.

## 2023-11-15 NOTE — Assessment & Plan Note (Signed)
Follow cbc to confirm wnl.

## 2023-11-28 ENCOUNTER — Other Ambulatory Visit: Payer: Self-pay | Admitting: Internal Medicine

## 2023-11-30 ENCOUNTER — Telehealth: Payer: Self-pay | Admitting: Adult Health

## 2023-11-30 ENCOUNTER — Other Ambulatory Visit (HOSPITAL_COMMUNITY): Payer: Self-pay

## 2023-11-30 MED ORDER — CITALOPRAM HYDROBROMIDE 20 MG PO TABS
20.0000 mg | ORAL_TABLET | Freq: Every day | ORAL | 2 refills | Status: DC
  Filled 2023-11-30: qty 30, 30d supply, fill #0

## 2023-11-30 NOTE — Telephone Encounter (Signed)
 Pt is requesting a refill for  zolpidem (AMBIEN) 10 MG tablet.  Pharmacy:  [facility](ph#[phone])

## 2023-11-30 NOTE — Telephone Encounter (Signed)
 Rx ok'd for celexa #30 with 2 refills.

## 2023-12-01 ENCOUNTER — Other Ambulatory Visit (HOSPITAL_COMMUNITY): Payer: Self-pay

## 2023-12-01 ENCOUNTER — Other Ambulatory Visit: Payer: Self-pay

## 2023-12-01 NOTE — Telephone Encounter (Signed)
 Last seen 03-31-2023, next appt 03-22-24.  She had mail order 07-12-2024 #90 delivery delayed so a 10 day sent to local pharm 08-25-2023.

## 2023-12-02 MED ORDER — ZOLPIDEM TARTRATE 10 MG PO TABS: 10.0000 mg | ORAL_TABLET | Freq: Every evening | ORAL | 0 refills | Status: DC | PRN

## 2023-12-23 ENCOUNTER — Other Ambulatory Visit: Payer: Self-pay

## 2023-12-23 ENCOUNTER — Other Ambulatory Visit (HOSPITAL_COMMUNITY): Payer: Self-pay

## 2023-12-23 MED ORDER — OXYCODONE HCL 5 MG PO TABS
5.0000 mg | ORAL_TABLET | Freq: Three times a day (TID) | ORAL | 0 refills | Status: AC | PRN
  Filled 2023-12-23 – 2023-12-29 (×3): qty 90, 30d supply, fill #0

## 2023-12-24 ENCOUNTER — Other Ambulatory Visit: Payer: Self-pay

## 2023-12-24 ENCOUNTER — Other Ambulatory Visit (HOSPITAL_COMMUNITY): Payer: Self-pay

## 2023-12-29 ENCOUNTER — Other Ambulatory Visit: Payer: Self-pay

## 2023-12-29 ENCOUNTER — Other Ambulatory Visit (HOSPITAL_COMMUNITY): Payer: Self-pay

## 2024-01-06 DIAGNOSIS — Z1231 Encounter for screening mammogram for malignant neoplasm of breast: Secondary | ICD-10-CM | POA: Diagnosis not present

## 2024-01-06 LAB — HM MAMMOGRAPHY

## 2024-01-10 ENCOUNTER — Encounter: Payer: Self-pay | Admitting: Internal Medicine

## 2024-01-11 DIAGNOSIS — M5416 Radiculopathy, lumbar region: Secondary | ICD-10-CM | POA: Diagnosis not present

## 2024-01-11 MED ORDER — ONDANSETRON HCL 4 MG PO TABS
4.0000 mg | ORAL_TABLET | Freq: Two times a day (BID) | ORAL | 0 refills | Status: AC | PRN
  Filled 2024-01-11: qty 20, 10d supply, fill #0

## 2024-01-11 MED ORDER — AZITHROMYCIN 250 MG PO TABS
ORAL_TABLET | ORAL | 0 refills | Status: AC
  Filled 2024-01-11: qty 6, 5d supply, fill #0

## 2024-01-11 MED ORDER — SCOPOLAMINE 1 MG/3DAYS TD PT72
1.0000 | MEDICATED_PATCH | TRANSDERMAL | 0 refills | Status: AC
  Filled 2024-01-11: qty 10, 30d supply, fill #0

## 2024-01-11 NOTE — Telephone Encounter (Signed)
 Rx sent in for zofran , zpak and scopolamine  patches.

## 2024-01-12 ENCOUNTER — Other Ambulatory Visit: Payer: Self-pay

## 2024-01-12 ENCOUNTER — Other Ambulatory Visit (HOSPITAL_COMMUNITY): Payer: Self-pay

## 2024-01-17 ENCOUNTER — Encounter: Payer: Self-pay | Admitting: Internal Medicine

## 2024-01-17 DIAGNOSIS — E2839 Other primary ovarian failure: Secondary | ICD-10-CM

## 2024-01-19 NOTE — Telephone Encounter (Signed)
 DEXA Scan printed for signature

## 2024-01-19 NOTE — Telephone Encounter (Signed)
 Signed and placed in box.

## 2024-01-27 ENCOUNTER — Ambulatory Visit (INDEPENDENT_AMBULATORY_CARE_PROVIDER_SITE_OTHER): Admitting: Internal Medicine

## 2024-01-27 ENCOUNTER — Encounter: Payer: Self-pay | Admitting: Internal Medicine

## 2024-01-27 ENCOUNTER — Encounter: Admitting: Internal Medicine

## 2024-01-27 VITALS — BP 110/68 | HR 68 | Temp 98.0°F | Resp 16 | Ht 67.0 in | Wt 155.0 lb

## 2024-01-27 DIAGNOSIS — D696 Thrombocytopenia, unspecified: Secondary | ICD-10-CM | POA: Diagnosis not present

## 2024-01-27 DIAGNOSIS — Z0000 Encounter for general adult medical examination without abnormal findings: Secondary | ICD-10-CM | POA: Diagnosis not present

## 2024-01-27 DIAGNOSIS — R739 Hyperglycemia, unspecified: Secondary | ICD-10-CM

## 2024-01-27 DIAGNOSIS — F439 Reaction to severe stress, unspecified: Secondary | ICD-10-CM | POA: Diagnosis not present

## 2024-01-27 DIAGNOSIS — Z713 Dietary counseling and surveillance: Secondary | ICD-10-CM

## 2024-01-27 DIAGNOSIS — Z1211 Encounter for screening for malignant neoplasm of colon: Secondary | ICD-10-CM

## 2024-01-27 LAB — CBC WITH DIFFERENTIAL/PLATELET
Basophils Absolute: 0 10*3/uL (ref 0.0–0.1)
Basophils Relative: 0.9 % (ref 0.0–3.0)
Eosinophils Absolute: 0.2 10*3/uL (ref 0.0–0.7)
Eosinophils Relative: 3.5 % (ref 0.0–5.0)
HCT: 39.2 % (ref 36.0–46.0)
Hemoglobin: 12.9 g/dL (ref 12.0–15.0)
Lymphocytes Relative: 28.9 % (ref 12.0–46.0)
Lymphs Abs: 1.4 10*3/uL (ref 0.7–4.0)
MCHC: 32.8 g/dL (ref 30.0–36.0)
MCV: 90.8 fl (ref 78.0–100.0)
Monocytes Absolute: 0.3 10*3/uL (ref 0.1–1.0)
Monocytes Relative: 5.2 % (ref 3.0–12.0)
Neutro Abs: 3 10*3/uL (ref 1.4–7.7)
Neutrophils Relative %: 61.5 % (ref 43.0–77.0)
Platelets: 158 10*3/uL (ref 150.0–400.0)
RBC: 4.31 Mil/uL (ref 3.87–5.11)
RDW: 14.2 % (ref 11.5–15.5)
WBC: 4.9 10*3/uL (ref 4.0–10.5)

## 2024-01-27 LAB — HEPATIC FUNCTION PANEL
ALT: 13 U/L (ref 0–35)
AST: 18 U/L (ref 0–37)
Albumin: 4.2 g/dL (ref 3.5–5.2)
Alkaline Phosphatase: 84 U/L (ref 39–117)
Bilirubin, Direct: 0.1 mg/dL (ref 0.0–0.3)
Total Bilirubin: 0.5 mg/dL (ref 0.2–1.2)
Total Protein: 6.5 g/dL (ref 6.0–8.3)

## 2024-01-27 LAB — TSH: TSH: 2.06 u[IU]/mL (ref 0.35–5.50)

## 2024-01-27 LAB — BASIC METABOLIC PANEL WITH GFR
BUN: 13 mg/dL (ref 6–23)
CO2: 31 meq/L (ref 19–32)
Calcium: 9 mg/dL (ref 8.4–10.5)
Chloride: 104 meq/L (ref 96–112)
Creatinine, Ser: 0.64 mg/dL (ref 0.40–1.20)
GFR: 92.42 mL/min (ref >60.00)
Glucose, Bld: 92 mg/dL (ref 70–99)
Potassium: 4 meq/L (ref 3.5–5.1)
Sodium: 142 meq/L (ref 135–145)

## 2024-01-27 LAB — LIPID PANEL
Cholesterol: 187 mg/dL (ref 0–200)
HDL: 68.7 mg/dL (ref >39.00)
LDL Cholesterol: 102 mg/dL — ABNORMAL HIGH (ref 0–99)
NonHDL: 118.51
Total CHOL/HDL Ratio: 3
Triglycerides: 81 mg/dL (ref 0.0–149.0)
VLDL: 16.2 mg/dL (ref 0.0–40.0)

## 2024-01-27 LAB — HEMOGLOBIN A1C: Hgb A1c MFr Bld: 6.1 % (ref 4.6–6.5)

## 2024-01-27 NOTE — Progress Notes (Signed)
 Subjective:    Kimberly Figueroa is a 68 y.o. female who presents for a Welcome to Medicare exam.   Cardiac Risk Factors include: advanced age (>55men, >27 women)      Objective:    Today's Vitals   01/27/24 1126 01/27/24 1133  BP:  110/68  Pulse:  68  Resp:  16  Temp:  98 F (36.7 C)  SpO2:  97%  Weight:  155 lb (70.3 kg)  Height:  5\' 7"  (1.702 m)  PainSc: 5    Body mass index is 24.28 kg/m.  Medications Outpatient Encounter Medications as of 01/27/2024  Medication Sig   citalopram  (CELEXA ) 20 MG tablet Take 1 tablet (20 mg total) by mouth daily.   doxycycline  (VIBRAMYCIN ) 100 MG capsule Take 100 mg by mouth daily.   EPINEPHrine  (EPIPEN  2-PAK) 0.3 mg/0.3 mL IJ SOAJ injection Inject 0.3 mg into the muscle as needed for anaphylaxis.   meloxicam  (MOBIC ) 7.5 MG tablet Take 1 tablet (7.5 mg total) by mouth 2 (two) times daily as needed.   Minocycline  HCl Micronized (ZILXI ) 1.5 % FOAM Apply a thin layer to the face QHS.   modafinil  (PROVIGIL ) 100 MG tablet Take 1 tablet (100 mg total) by mouth daily.   Multiple Vitamins-Minerals (MULTIVITAMIN GUMMIES ADULTS PO) Take by mouth. [employer]   ondansetron  (ZOFRAN ) 4 MG tablet Take 1 tablet (4 mg total) by mouth 2 (two) times daily as needed for nausea or vomiting.   oxyCODONE  (OXY IR/ROXICODONE ) 5 MG immediate release tablet Take 1 tablet by mouth every 8 hours as needed   oxyCODONE  (OXY IR/ROXICODONE ) 5 MG immediate release tablet Take 1 tablet (5 mg total) by mouth every 8 (eight) hours as needed.   oxyCODONE  (OXY IR/ROXICODONE ) 5 MG immediate release tablet Take 1 tablet (5 mg total) by mouth every 8 (eight) hours as needed.   oxyCODONE  (OXY IR/ROXICODONE ) 5 MG immediate release tablet Take 1 tablet (5 mg total) by mouth every 8 (eight) hours as needed.   pimecrolimus  (ELIDEL ) 1 % cream Apply topically 2 (two) times daily. Apply to scaly areas at ears   scopolamine  (TRANSDERM-SCOP) 1 MG/3DAYS Place 1 patch (1.5 mg total) onto  the skin behind the ear every 3 (three) days.   tretinoin  (RETIN-A ) 0.025 % cream APPLY A PEA SIZED AMOUNT TO THE ENTIRE FACE EVERY NIGHT AT BEDTIME   triamcinolone  cream (KENALOG ) 0.1 % Apply twice daily to ears up to 2 weeks as needed for itching.   UNABLE TO FIND 2 (two) times daily as needed. Skin Medicinals(cream):  Azelaic Acid 15%, Ivermectin 1%, Metronidazole 1%   zolpidem  (AMBIEN ) 10 MG tablet Take 1 tablet (10 mg total) by mouth at bedtime as needed.   [DISCONTINUED] doxycycline  (ORACEA ) 40 MG capsule TAKE ONE CAPSULE BY MOUTH DAILY. TAKE WITH FOOD. (Patient taking differently: Take 100 mg by mouth every morning.)   No facility-administered encounter medications on file as of 01/27/2024.     History: Past Medical History:  Diagnosis Date   Actinic keratosis 08/21/2021   Left medial calf.   Allergy 1970   Stinging insects. Need epipen  refill   Cataract    S/p caratact survery   Endometriosis    History of frequent urinary tract infections    s/p bladder dilatation (11/05)   Hypersomnia, recurrent 06/28/2014   ITP (idiopathic thrombocytopenic purpura)    Pneumonia    Rosacea    Past Surgical History:  Procedure Laterality Date   bladder dilatation  06/25/2004   CATARACT  EXTRACTION W/PHACO Right 12/13/2019   Procedure: CATARACT EXTRACTION PHACO AND INTRAOCULAR LENS PLACEMENT (IOC) RIGHT VIVITY TORIC LENS 4.35 00:30.2;  Surgeon: Clair Crews, MD;  Location: [facility];  Service: Ophthalmology;  Laterality: Right;   CATARACT EXTRACTION W/PHACO Left 01/10/2020   Procedure: CATARACT EXTRACTION PHACO AND INTRAOCULAR LENS PLACEMENT (IOC) LEFT VIVITY TORIC LENS 6.19  00:32.8;  Surgeon: Clair Crews, MD;  Location: [facility];  Service: Ophthalmology;  Laterality: Left;   CESAREAN SECTION  08/26/1991   COLONOSCOPY     COSMETIC SURGERY     EYE SURGERY  2021   Cataracts   laproscopic surgery  05/25/2005   found to have endometriosis   SHOULDER SURGERY  Left 08/25/2006   TONSILLECTOMY  08/26/1959   VAGINAL DELIVERY  08/25/1985   VAGINAL HYSTERECTOMY  09/25/2005    Family History  Problem Relation Age of Onset   Alcohol abuse Mother    Varicose Veins Mother    Diabetes Father    Hearing loss Father    Heart disease Father    Crohn's disease Father    Heart disease Paternal Grandmother    Crohn's disease Paternal Grandfather    Breast cancer Neg Hx    Colon cancer Neg Hx    Social History   Occupational History   Occupation: Pharmacist, hospital. Director    Employer: [employer]  Tobacco Use   Smoking status: Never   Smokeless tobacco: Never  Vaping Use   Vaping status: Never Used  Substance and Sexual Activity   Alcohol use: Yes    Alcohol/week: 1.0 standard drink of alcohol    Types: 1 Glasses of wine per week   Drug use: Never   Sexual activity: Yes    Birth control/protection: Surgical    Comment: Monogamous    Tobacco Counseling Counseling given: Not Answered Is not a smoker  Immunizations and Health Maintenance Immunization History  Administered Date(s) Administered   Influenza Inj Mdck Quad Pf 07/03/2022   Influenza Split 05/30/2013, 06/10/2014   Influenza, High Dose Seasonal PF 07/10/2023   Influenza,inj,Quad PF,6+ Mos 06/26/2021   Influenza-Unspecified 05/26/2019, 05/29/2020   PFIZER(Purple Top)SARS-COV-2 Vaccination 09/07/2019, 09/26/2019, 06/15/2020   Pfizer(Comirnaty)Fall Seasonal Vaccine 12 years and older 07/11/2022, 07/10/2023   Tdap 01/19/2019   Zoster Recombinant(Shingrix ) 06/09/2019, 11/24/2019   Discussed due prevnar. Wanted to hold to receive until she gets back from her trip.   Health Maintenance Due  Topic Date Due   HIV Screening  Never done   Hepatitis C Screening  Never done   Pneumonia Vaccine 80+ Years old (1 of 2 - PCV) Never done   DEXA SCAN  Never done   Colonoscopy  02/09/2024   Bone density ordered.   Activities of Daily Living    01/27/2024   11:36 AM 01/23/2024    9:05 PM  In your  present state of health, do you have any difficulty performing the following activities:  Hearing? 0 0  Vision? 0 0  Difficulty concentrating or making decisions? 0 0  Walking or climbing stairs? 0 0  Dressing or bathing? 0 0  Doing errands, shopping? 0 0  Preparing Food and eating ? N N  Using the Toilet? N N  In the past six months, have you accidently leaked urine? N N  Do you have problems with loss of bowel control? N N  Managing your Medications? N N  Managing your Finances? N N  Housekeeping or managing your Housekeeping? N N    Physical Exam  General: NAD. HEENT - oropharynx - no exudates. No erythema. Heart - appears to be regular. Lungs: clear. Abdomen - soft, nontender. Bowels sounds present and normal. Extremities:  no edema.    Advanced Directives: Does Patient Have a Medical Advance Directive?: Yes Type of Advance Directive: Healthcare Power of Attorney, Living will Does patient want to make changes to medical advance directive?: No - Patient declined Copy of Healthcare Power of Attorney in Chart?: No - copy requested  EKG:  normal EKG, normal sinus rhythm, unchanged from previous tracings      Assessment:    This is a routine wellness examination for this patient .  Vision/Hearing screen Vision Screening   Right eye Left eye Both eyes  Without correction     With correction 20/30 20/40 20/30      Goals   None    Depression Screen    01/27/2024   11:41 AM 11/05/2023   10:48 AM 02/23/2023   11:30 AM 07/25/2021    2:06 PM  PHQ 2/9 Scores  PHQ - 2 Score 0 0 0 2  PHQ- 9 Score  0 4 5     Fall Risk    01/27/2024   11:43 AM  Fall Risk   Falls in the past year? 0  Injury with Fall? 0  Risk for fall due to : No Fall Risks  Follow up Falls evaluation completed    Cognitive Function:        01/27/2024   11:45 AM  6CIT Screen  What Year? 0 points  What month? 0 points  What time? 0 points  Count back from 20 0 points  Months in reverse 0 points   Repeat phrase 0 points  Total Score 0 points    Patient Care Team: Dellar Fenton, MD as PCP - General (Internal Medicine)     Plan:     I have personally reviewed and noted the following in the patient's chart:   Medical and social history Use of alcohol, tobacco or illicit drugs  Current medications and supplements including opioid prescriptions. No opioid prescriptions.  Functional ability and status Nutritional status Physical activity Advanced directives List of other physicians Hospitalizations, surgeries, and ER visits in previous 12 months Vitals Screenings to include cognitive, depression, and falls Referrals and appointments  In addition, I have reviewed and discussed with patient certain preventive protocols, quality metrics, and best practice recommendations. A written personalized care plan for preventive services as well as general preventive health recommendations were provided to patient.     Dellar Fenton, MD 01/31/2024

## 2024-01-28 ENCOUNTER — Ambulatory Visit: Payer: Self-pay | Admitting: Internal Medicine

## 2024-01-31 ENCOUNTER — Encounter: Payer: Self-pay | Admitting: Internal Medicine

## 2024-01-31 DIAGNOSIS — Z713 Dietary counseling and surveillance: Secondary | ICD-10-CM | POA: Insufficient documentation

## 2024-01-31 NOTE — Assessment & Plan Note (Signed)
 Low-carb diet and exercise.  Follow met b and A1c.

## 2024-01-31 NOTE — Assessment & Plan Note (Signed)
 Discussed. Refer to [place] weight [facility].

## 2024-01-31 NOTE — Assessment & Plan Note (Signed)
 Colonoscopy 2015.  Recommended f/u in 10 years. Discussed. Request cologuard. Wants to hold on colonoscopy.

## 2024-01-31 NOTE — Assessment & Plan Note (Signed)
 Overall appears to be handling things well. Doing well on citalopram . Follow.

## 2024-01-31 NOTE — Assessment & Plan Note (Signed)
Follow cbc to confirm wnl.

## 2024-02-17 ENCOUNTER — Other Ambulatory Visit: Payer: Self-pay | Admitting: Adult Health

## 2024-02-19 ENCOUNTER — Encounter: Payer: Self-pay | Admitting: Adult Health

## 2024-02-22 ENCOUNTER — Encounter (INDEPENDENT_AMBULATORY_CARE_PROVIDER_SITE_OTHER): Payer: Self-pay

## 2024-02-22 NOTE — Telephone Encounter (Signed)
 Last visit: 03/31/23 Next visit: 03/22/24 Last fill:   Rx refill sent to [facility].

## 2024-02-22 NOTE — Telephone Encounter (Signed)
 Spoke with pharmacist at [facility] who confirmed last refill was #90/90 on 12/02/23.   Drug registry states this:

## 2024-03-09 ENCOUNTER — Telehealth: Payer: Self-pay

## 2024-03-09 DIAGNOSIS — L218 Other seborrheic dermatitis: Secondary | ICD-10-CM | POA: Diagnosis not present

## 2024-03-09 DIAGNOSIS — L718 Other rosacea: Secondary | ICD-10-CM | POA: Diagnosis not present

## 2024-03-09 DIAGNOSIS — L700 Acne vulgaris: Secondary | ICD-10-CM | POA: Diagnosis not present

## 2024-03-09 NOTE — Telephone Encounter (Signed)
 Copied from CRM [id number]. Topic: General - Other >> Mar 09, 2024  4:47 PM Chiquita SQUIBB wrote: Reason for CRM: [facility] is calling in to have the Bone Density scan faxed over to them at [phone]. The patient is scheduled next week at the [facility] location.

## 2024-03-10 ENCOUNTER — Telehealth: Payer: Self-pay | Admitting: Internal Medicine

## 2024-03-10 NOTE — Telephone Encounter (Signed)
 I received notification that she does not qualify for the [employer] wellness program. I can refer her to [facility] at [facility] for diet and nutrition education if desires. Just let us  know.

## 2024-03-11 NOTE — Telephone Encounter (Signed)
 Holding until Dr Glendia returns to sign.

## 2024-03-11 NOTE — Telephone Encounter (Signed)
Pt is not interested at this time

## 2024-03-14 NOTE — Telephone Encounter (Signed)
 faxed

## 2024-03-14 NOTE — Telephone Encounter (Signed)
 Printed for [employer]

## 2024-03-15 DIAGNOSIS — H16223 Keratoconjunctivitis sicca, not specified as Sjogren's, bilateral: Secondary | ICD-10-CM | POA: Diagnosis not present

## 2024-03-15 DIAGNOSIS — H26493 Other secondary cataract, bilateral: Secondary | ICD-10-CM | POA: Diagnosis not present

## 2024-03-15 DIAGNOSIS — H43811 Vitreous degeneration, right eye: Secondary | ICD-10-CM | POA: Diagnosis not present

## 2024-03-21 DIAGNOSIS — M810 Age-related osteoporosis without current pathological fracture: Secondary | ICD-10-CM | POA: Diagnosis not present

## 2024-03-21 LAB — HM DEXA SCAN

## 2024-03-22 ENCOUNTER — Telehealth: Payer: Medicare HMO | Admitting: Adult Health

## 2024-03-22 DIAGNOSIS — G4700 Insomnia, unspecified: Secondary | ICD-10-CM

## 2024-03-22 DIAGNOSIS — F5104 Psychophysiologic insomnia: Secondary | ICD-10-CM

## 2024-03-22 DIAGNOSIS — R400 Somnolence: Secondary | ICD-10-CM | POA: Diagnosis not present

## 2024-03-22 NOTE — Progress Notes (Signed)
 I agree with the Assessment and Plan :  Dedra Gores, MD  [facility] and [facility] Sleep Board certified by The [employer] of Sleep Medicine and Diplomate of the [employer] of Sleep Medicine. Board certified In Neurology through the ABPN, Fellow of the [employer] of Neu[employer].

## 2024-03-22 NOTE — Patient Instructions (Signed)
 Your Plan:  Try decreasing Ambien  to 5 mg at bedtime Continue Provigil  1 tablet daily if needed If your symptoms worsen or you develop new symptoms please let us  know.   Thank you for coming to see us  at [facility]. I hope we have been able to provide you high quality care today.  You may receive a patient satisfaction survey over the next few weeks. We would appreciate your feedback and comments so that we may continue to improve ourselves and the health of our patients.

## 2024-03-22 NOTE — Progress Notes (Signed)
 PATIENT: Kimberly Figueroa DOB: 08-Dec-1955  REASON FOR VISIT: follow up HISTORY FROM: patient  Virtual Visit via Video Note  I connected with Sharyle KATHEE Barefoot on 03/22/24 at  2:00 PM EDT by a video enabled telemedicine application located remotely at [facility] and verified that I am speaking with the correct person using two identifiers who was located at their own home.   I discussed the limitations of evaluation and management by telemedicine and the availability of in person appointments. The patient expressed understanding and agreed to proceed.   PATIENT: Kimberly Figueroa DOB: 09-Sep-1955  REASON FOR VISIT: follow up HISTORY FROM: patient  HISTORY OF PRESENT ILLNESS: Today 03/22/24:  Kimberly Figueroa is a 68 y.o. female with a history of insomnia and daytime sleepiness. Returns today for follow-up.  She continues to take Ambien  every night and this works well for her.  She has been on this for several years.  She uses Provigil  only once or twice a month for significant daytime sleepiness.  Typically uses only when she has to drive a long distance.  Denies any new medical history.  Denies any issues with her blood pressure or heart rate.   03/31/23: Kimberly Figueroa is a 68 y.o. female with a history of Insomnia and daytime sleepiness. Returns today for follow-up.  She reports that she takes Ambien  each night and this works well for her.  She denies any new issues.  She also has Provigil  but only uses it once or twice a month.  She currently resides in [place] .  She returns today for virtual visit  09/09/22: Kimberly Figueroa is a 68 y.o. female with a history of Insomnia and daytime sleepiness. Returns today for follow-up.  She is now taking a full tablet of Ambien  each night.  She states that she is sleeping the best that she has sleep.  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  She states that  she does have Provigil  but typically only uses it once or twice a month for longer drives.  She returns today for an evaluation.   5/23/23Ms. Figueroa is a 68 year old female with a history of insomnia and daytime sleepiness.  She returns today for follow-up.  She reports that she typically takes half a tablet of Ambien  most nights.  On occasion she will have to take the extra half of Ambien .  She continues to use Provigil  during the day.  But she primarily uses it only when she is driving.  07/16/21: Kimberly Figueroa is a 68 year old female with a history of insomnia and daytime sleepiness.  She returns today for follow-up.  She continues on Ambien  and Provigil .  She states that typically she can take a half a tablet of Ambien  and that works well.  She states that her father recently passed and she has been using 1 full tablet of Ambien  but plans to reduce her dose in the future.  She does not take Provigil  daily.  Continues to only use it if she has to drop early in the morning.  01/22/21: Kimberly Figueroa is a 68 year old female with a history of insomnia and daytime sleepiness.  She returns today for follow-up.  She states that she typically takes a half a tablet of Ambien  nightly.  On occasion she does have to take the other half.  She reports on the rare occasion she will have to go ahead and take the full tablet at bedtime.  She states that she has Provigil  but probably is only used 3 times in the last month.  Overall she feels that this is working well for her.  She returns today for an evaluation.  HISTORY Kimberly Figueroa is a 68 y.o. female who has been followed in this office for insomnia and daytime sleepiness.  She was initially scheduled for MyChart video visit however she was unable to hear me so we transferred to a telephone visit.  She states that over the last month her stress levels have increased.  Therefore she has been having a difficult time sleeping and has been using 1 full  tablet of Ambien .  She also reports that she has been having low back pain and is seeing pain management.  She states this also disrupts her sleep.  She states that she has Provigil  but probably use it 5 to 10 days out of a month.  She typically only uses it when she has early morning meetings.  She returns today for follow-up.    REVIEW OF SYSTEMS: Out of a complete 14 system review of symptoms, the patient complains only of the following symptoms, and all other reviewed systems are negative.  See HPI  ALLERGIES: Allergies  Allergen Reactions   Hydrocodone    Oxycodone  Itching   Darvon [Propoxyphene] Nausea Only   Vicodin [Hydrocodone-Acetaminophen ] Itching    HOME MEDICATIONS: Outpatient Medications Prior to Visit  Medication Sig Dispense Refill   citalopram  (CELEXA ) 20 MG tablet Take 1 tablet (20 mg total) by mouth daily. 30 tablet 2   doxycycline  (VIBRAMYCIN ) 100 MG capsule Take 100 mg by mouth daily.     EPINEPHrine  (EPIPEN  2-PAK) 0.3 mg/0.3 mL IJ SOAJ injection Inject 0.3 mg into the muscle as needed for anaphylaxis. 2 each 1   meloxicam  (MOBIC ) 7.5 MG tablet Take 1 tablet (7.5 mg total) by mouth 2 (two) times daily as needed. 30 tablet 0   Minocycline  HCl Micronized (ZILXI ) 1.5 % FOAM Apply a thin layer to the face QHS. 30 g 2   modafinil  (PROVIGIL ) 100 MG tablet Take 1 tablet (100 mg total) by mouth daily. 90 tablet 1   Multiple Vitamins-Minerals (MULTIVITAMIN GUMMIES ADULTS PO) Take by mouth. VitaCraves     ondansetron  (ZOFRAN ) 4 MG tablet Take 1 tablet (4 mg total) by mouth 2 (two) times daily as needed for nausea or vomiting. 20 tablet 0   oxyCODONE  (OXY IR/ROXICODONE ) 5 MG immediate release tablet Take 1 tablet by mouth every 8 hours as needed 90 tablet 0   oxyCODONE  (OXY IR/ROXICODONE ) 5 MG immediate release tablet Take 1 tablet (5 mg total) by mouth every 8 (eight) hours as needed. 15 tablet 0   oxyCODONE  (OXY IR/ROXICODONE ) 5 MG immediate release tablet Take 1 tablet (5 mg  total) by mouth every 8 (eight) hours as needed. 90 tablet 0   oxyCODONE  (OXY IR/ROXICODONE ) 5 MG immediate release tablet Take 1 tablet (5 mg total) by mouth every 8 (eight) hours as needed. 90 tablet 0   pimecrolimus  (ELIDEL ) 1 % cream Apply topically 2 (two) times daily. Apply to scaly areas at ears 30 g 1   scopolamine  (TRANSDERM-SCOP) 1 MG/3DAYS Place 1 patch (1.5 mg total) onto the skin behind the ear every 3 (three) days. 10 patch 0   tretinoin  (RETIN-A ) 0.025 % cream APPLY A PEA SIZED AMOUNT TO THE ENTIRE FACE EVERY NIGHT AT BEDTIME 45 g 3   triamcinolone  cream (KENALOG ) 0.1 % Apply twice daily to ears up to  2 weeks as needed for itching. 30 g 0   UNABLE TO FIND 2 (two) times daily as needed. Skin Medicinals(cream):  Azelaic Acid 15%, Ivermectin 1%, Metronidazole 1%     zolpidem  (AMBIEN ) 10 MG tablet TAKE 1 TABLET BY MOUTH AT  BEDTIME AS NEEDED 90 tablet 0   No facility-administered medications prior to visit.    PAST MEDICAL HISTORY: Past Medical History:  Diagnosis Date   Actinic keratosis 08/21/2021   Left medial calf.   Allergy 1970   Stinging insects. Need epipen  refill   Cataract    S/p caratact survery   Endometriosis    History of frequent urinary tract infections    s/p bladder dilatation (11/05)   Hypersomnia, recurrent 06/28/2014   ITP (idiopathic thrombocytopenic purpura)    Pneumonia    Rosacea     PAST SURGICAL HISTORY: Past Surgical History:  Procedure Laterality Date   bladder dilatation  06/25/2004   CATARACT EXTRACTION W/PHACO Right 12/13/2019   Procedure: CATARACT EXTRACTION PHACO AND INTRAOCULAR LENS PLACEMENT (IOC) RIGHT VIVITY TORIC LENS 4.35 00:30.2;  Surgeon: Jaye Fallow, MD;  Location: [facility];  Service: Ophthalmology;  Laterality: Right;   CATARACT EXTRACTION W/PHACO Left 01/10/2020   Procedure: CATARACT EXTRACTION PHACO AND INTRAOCULAR LENS PLACEMENT (IOC) LEFT VIVITY TORIC LENS 6.19  00:32.8;  Surgeon: Jaye Fallow, MD;   Location: [facility];  Service: Ophthalmology;  Laterality: Left;   CESAREAN SECTION  08/26/1991   COLONOSCOPY     COSMETIC SURGERY     EYE SURGERY  2021   Cataracts   laproscopic surgery  05/25/2005   found to have endometriosis   SHOULDER SURGERY Left 08/25/2006   TONSILLECTOMY  08/26/1959   VAGINAL DELIVERY  08/25/1985   VAGINAL HYSTERECTOMY  09/25/2005    FAMILY HISTORY: Family History  Problem Relation Age of Onset   Alcohol abuse Mother    Varicose Veins Mother    Diabetes Father    Hearing loss Father    Heart disease Father    Crohn's disease Father    Heart disease Paternal Grandmother    Crohn's disease Paternal Grandfather    Breast cancer Neg Hx    Colon cancer Neg Hx     SOCIAL HISTORY: Social History   Socioeconomic History   Marital status: Married    Spouse name: Fallow   Number of children: 2   Years of education: college   Highest education level: Master's degree (e.g., MA, MS, MEng, MEd, MSW, MBA)  Occupational History   Occupation: Exec. Director    Employer: [employer]  Tobacco Use   Smoking status: Never   Smokeless tobacco: Never  Vaping Use   Vaping status: Never Used  Substance and Sexual Activity   Alcohol use: Yes    Alcohol/week: 1.0 standard drink of alcohol    Types: 1 Glasses of wine per week   Drug use: Never   Sexual activity: Yes    Birth control/protection: Surgical    Comment: Monogamous  Other Topics Concern   Not on file  Social History Narrative   Patient consumes 6-8 cups tea daily, is right handed   Social Drivers of Corporate investment banker Strain: Low Risk  (01/27/2024)   Overall Financial Resource Strain (CARDIA)    Difficulty of Paying Living Expenses: Not hard at all  Food Insecurity: No Food Insecurity (01/27/2024)   Hunger Vital Sign    Worried About Running Out of Food in the Last Year: Never true  Ran Out of Food in the Last Year: Never true  Transportation Needs: No Transportation Needs  (01/27/2024)   PRAPARE - Administrator, Civil Service (Medical): No    Lack of Transportation (Non-Medical): No  Physical Activity: Insufficiently Active (01/27/2024)   Exercise Vital Sign    Days of Exercise per Week: 3 days    Minutes of Exercise per Session: 30 min  Stress: No Stress Concern Present (01/27/2024)   [employer] of Occupational Health - Occupational Stress Questionnaire    Feeling of Stress : Only a little  Social Connections: Unknown (01/27/2024)   Social Connection and Isolation Panel    Frequency of Communication with Friends and Family: More than three times a week    Frequency of Social Gatherings with Friends and Family: Once a week    Attends Religious Services: Patient declined    Database administrator or Organizations: Yes    Attends Engineer, structural: More than 4 times per year    Marital Status: Married  Catering manager Violence: Not At Risk (01/27/2024)   Humiliation, Afraid, Rape, and Kick questionnaire    Fear of Current or Ex-Partner: No    Emotionally Abused: No    Physically Abused: No    Sexually Abused: No      PHYSICAL EXAM Generalized: Well developed, in no acute distress   Neurological examination  Mentation: Alert oriented to time, place, history taking. Follows all commands speech and language fluent Cranial nerve II-XII:. Facial symmetry noted.  Reflexes: UTA  DIAGNOSTIC DATA (LABS, IMAGING, TESTING) - I reviewed patient records, labs, notes, testing and imaging myself where available.  Lab Results  Component Value Date   WBC 4.9 01/27/2024   HGB 12.9 01/27/2024   HCT 39.2 01/27/2024   MCV 90.8 01/27/2024   PLT 158.0 01/27/2024      Component Value Date/Time   NA 142 01/27/2024 1247   NA 144 02/06/2023 1549   K 4.0 01/27/2024 1247   CL 104 01/27/2024 1247   CO2 31 01/27/2024 1247   GLUCOSE 92 01/27/2024 1247   BUN 13 01/27/2024 1247   BUN 13 02/06/2023 1549   CREATININE 0.64 01/27/2024 1247    CALCIUM 9.0 01/27/2024 1247   PROT 6.5 01/27/2024 1247   PROT 6.5 02/06/2023 1549   ALBUMIN 4.2 01/27/2024 1247   ALBUMIN 4.6 02/06/2023 1549   AST 18 01/27/2024 1247   ALT 13 01/27/2024 1247   ALKPHOS 84 01/27/2024 1247   BILITOT 0.5 01/27/2024 1247   BILITOT 0.3 02/06/2023 1549   Lab Results  Component Value Date   CHOL 187 01/27/2024   HDL 68.70 01/27/2024   LDLCALC 102 (H) 01/27/2024   TRIG 81.0 01/27/2024   CHOLHDL 3 01/27/2024   Lab Results  Component Value Date   HGBA1C 6.1 01/27/2024   No results found for: VITAMINB12 Lab Results  Component Value Date   TSH 2.06 01/27/2024      ASSESSMENT AND PLAN 68 y.o. year old female  has a past medical history of Actinic keratosis (08/21/2021), Allergy (1970), Cataract, Endometriosis, History of frequent urinary tract infections, Hypersomnia, recurrent (06/28/2014), ITP (idiopathic thrombocytopenic purpura), Pneumonia, and Rosacea. here with:  1.  Insomnia 2.  Daytime sleepiness  -- We discussed trying to reduce Ambien  down to 5 mg at bedtime.  She states that she will try that in the next 1 to 2 weeks.  She is going to go out of town so she will wait till she  returns. -- Continue Provigil  100 mg 1 tablet daily if needed -- Advised if symptoms worsen or she develops new symptoms they should let us  know -- Follow-up in 6 months or sooner if needed in office   . Duwaine Russell, MSN, NP-C 03/22/2024, 1:47 PM [facility] [address], [address] [place], [place] [address] [phone]  The patient's condition requires frequent monitoring and adjustments in the treatment plan, reflecting the ongoing complexity of care.  This provider is the continuing focal point for all needed services for this condition.

## 2024-03-23 ENCOUNTER — Encounter: Payer: Self-pay | Admitting: Internal Medicine

## 2024-03-25 ENCOUNTER — Ambulatory Visit: Payer: Self-pay | Admitting: Internal Medicine

## 2024-04-07 ENCOUNTER — Encounter: Admitting: Internal Medicine

## 2024-04-07 DIAGNOSIS — H903 Sensorineural hearing loss, bilateral: Secondary | ICD-10-CM | POA: Diagnosis not present

## 2024-04-13 DIAGNOSIS — H26493 Other secondary cataract, bilateral: Secondary | ICD-10-CM | POA: Diagnosis not present

## 2024-04-13 DIAGNOSIS — H16223 Keratoconjunctivitis sicca, not specified as Sjogren's, bilateral: Secondary | ICD-10-CM | POA: Diagnosis not present

## 2024-04-13 DIAGNOSIS — H26491 Other secondary cataract, right eye: Secondary | ICD-10-CM | POA: Diagnosis not present

## 2024-04-14 DIAGNOSIS — Z1211 Encounter for screening for malignant neoplasm of colon: Secondary | ICD-10-CM | POA: Diagnosis not present

## 2024-04-20 LAB — COLOGUARD: COLOGUARD: NEGATIVE

## 2024-05-03 ENCOUNTER — Encounter: Admitting: Internal Medicine

## 2024-05-11 ENCOUNTER — Other Ambulatory Visit (HOSPITAL_COMMUNITY): Payer: Self-pay

## 2024-05-12 ENCOUNTER — Other Ambulatory Visit (HOSPITAL_COMMUNITY): Payer: Self-pay

## 2024-05-17 DIAGNOSIS — M5416 Radiculopathy, lumbar region: Secondary | ICD-10-CM | POA: Diagnosis not present

## 2024-05-31 DIAGNOSIS — H26492 Other secondary cataract, left eye: Secondary | ICD-10-CM | POA: Diagnosis not present

## 2024-06-01 DIAGNOSIS — G8929 Other chronic pain: Secondary | ICD-10-CM | POA: Diagnosis not present

## 2024-06-01 DIAGNOSIS — M5416 Radiculopathy, lumbar region: Secondary | ICD-10-CM | POA: Diagnosis not present

## 2024-06-01 DIAGNOSIS — M461 Sacroiliitis, not elsewhere classified: Secondary | ICD-10-CM | POA: Diagnosis not present

## 2024-06-03 ENCOUNTER — Encounter: Payer: Self-pay | Admitting: Internal Medicine

## 2024-06-03 DIAGNOSIS — M461 Sacroiliitis, not elsewhere classified: Secondary | ICD-10-CM | POA: Insufficient documentation

## 2024-06-03 DIAGNOSIS — M5416 Radiculopathy, lumbar region: Secondary | ICD-10-CM | POA: Insufficient documentation

## 2024-06-05 ENCOUNTER — Other Ambulatory Visit: Payer: Self-pay | Admitting: Internal Medicine

## 2024-06-16 ENCOUNTER — Other Ambulatory Visit: Payer: Self-pay | Admitting: Adult Health

## 2024-06-16 DIAGNOSIS — M461 Sacroiliitis, not elsewhere classified: Secondary | ICD-10-CM | POA: Diagnosis not present

## 2024-06-17 NOTE — Telephone Encounter (Signed)
 LOV 03/22/24  Next OV 01/04/25  Last refill 02/23/24, #90, 0 refills  Please review, thanks!

## 2024-06-20 NOTE — Telephone Encounter (Signed)
 Called pt and LVM asking for call back to discuss medication.

## 2024-06-20 NOTE — Telephone Encounter (Signed)
 When I looked on the drug registry it appears that the patient was also getting oxycodone .  Can we call the patient and see how often she is taking oxycodone .  Ideally you should not be on oxycodone  and Ambien .

## 2024-06-21 NOTE — Telephone Encounter (Signed)
 Pt returned call. She said she takes Oxycodone  rarely for the ruptured discs in her back. She gets injections about every 3 months and will sometimes take the Oxycodone  if she has to wait a little longer for an injection. She states she only filled it recently because her prescription had expired, not because she had used all of her supply. She thanked us  for the caution.

## 2024-06-23 ENCOUNTER — Other Ambulatory Visit: Payer: Self-pay | Admitting: Medical Genetics

## 2024-06-23 DIAGNOSIS — Z006 Encounter for examination for normal comparison and control in clinical research program: Secondary | ICD-10-CM

## 2024-06-23 NOTE — Telephone Encounter (Signed)
 Discussed with Dr. Chalice.  She is okay with me refilling Ambien .

## 2024-06-27 ENCOUNTER — Encounter: Admitting: Internal Medicine

## 2024-08-16 ENCOUNTER — Encounter: Payer: Self-pay | Admitting: Internal Medicine

## 2024-08-31 ENCOUNTER — Encounter: Admitting: Internal Medicine

## 2024-09-20 ENCOUNTER — Encounter: Payer: Self-pay | Admitting: Adult Health

## 2024-09-20 MED ORDER — ZOLPIDEM TARTRATE 10 MG PO TABS: 10.0000 mg | ORAL_TABLET | Freq: Every evening | ORAL | 0 refills | Status: AC | PRN

## 2024-09-20 MED ORDER — MODAFINIL 100 MG PO TABS: 100.0000 mg | ORAL_TABLET | Freq: Every day | ORAL | 1 refills | Status: AC

## 2024-09-22 ENCOUNTER — Telehealth: Payer: Self-pay | Admitting: Internal Medicine

## 2024-09-22 NOTE — Telephone Encounter (Signed)
 View All Conversations on this Encounter Kimberly Figueroa  P [facility] Admin1 hour ago (11:52 AM)   Appointment canceled for Kimberly Figueroa ([id number]) Visit type: PHYSICAL 09/26/2024 3:30 PM (30 minutes) with Allena Hamilton, MD in [facility]   Reason for cancellation: Moved   Patient comments: I live two hours away, and I cant make a 4- hour round trip if I have an acute issue. I love Dr. Hamilton, but I need to be practical.

## 2024-09-26 ENCOUNTER — Encounter: Admitting: Internal Medicine

## 2024-09-27 ENCOUNTER — Encounter: Payer: Self-pay | Admitting: Adult Health

## 2025-01-04 ENCOUNTER — Ambulatory Visit: Admitting: Adult Health
